# Patient Record
Sex: Male | Born: 1935 | Race: Black or African American | Hispanic: No | Marital: Married | Smoking: Former smoker
Health system: Southern US, Community
[De-identification: ages and names within clinical notes are randomized; demographics above are authoritative.]

## PROBLEM LIST (undated history)

## (undated) DIAGNOSIS — D500 Iron deficiency anemia secondary to blood loss (chronic): Secondary | ICD-10-CM

## (undated) DIAGNOSIS — M1990 Unspecified osteoarthritis, unspecified site: Secondary | ICD-10-CM

## (undated) DIAGNOSIS — I10 Essential (primary) hypertension: Secondary | ICD-10-CM

## (undated) DIAGNOSIS — E7800 Pure hypercholesterolemia, unspecified: Secondary | ICD-10-CM

## (undated) DIAGNOSIS — D649 Anemia, unspecified: Secondary | ICD-10-CM

## (undated) DIAGNOSIS — E539 Vitamin B deficiency, unspecified: Secondary | ICD-10-CM

## (undated) DIAGNOSIS — K219 Gastro-esophageal reflux disease without esophagitis: Secondary | ICD-10-CM

## (undated) DIAGNOSIS — I639 Cerebral infarction, unspecified: Secondary | ICD-10-CM

## (undated) HISTORY — PX: OTHER SURGICAL HISTORY: SHX169

## (undated) HISTORY — DX: Iron deficiency anemia secondary to blood loss (chronic): D50.0

## (undated) HISTORY — DX: Cerebral infarction, unspecified: I63.9

## (undated) HISTORY — DX: Unspecified osteoarthritis, unspecified site: M19.90

## (undated) HISTORY — PX: NO PAST SURGERIES: SHX2092

---

## 2010-10-11 ENCOUNTER — Emergency Department (HOSPITAL_COMMUNITY)
Admission: EM | Admit: 2010-10-11 | Discharge: 2010-10-11 | Payer: Self-pay | Source: Home / Self Care | Admitting: Emergency Medicine

## 2016-07-30 DIAGNOSIS — I10 Essential (primary) hypertension: Secondary | ICD-10-CM | POA: Diagnosis not present

## 2016-07-30 DIAGNOSIS — Z1211 Encounter for screening for malignant neoplasm of colon: Secondary | ICD-10-CM | POA: Diagnosis not present

## 2016-07-30 DIAGNOSIS — Z6827 Body mass index (BMI) 27.0-27.9, adult: Secondary | ICD-10-CM | POA: Diagnosis not present

## 2016-07-30 DIAGNOSIS — Z23 Encounter for immunization: Secondary | ICD-10-CM | POA: Diagnosis not present

## 2016-07-30 DIAGNOSIS — Z0000 Encounter for general adult medical examination without abnormal findings: Secondary | ICD-10-CM | POA: Diagnosis not present

## 2016-07-30 DIAGNOSIS — E7801 Familial hypercholesterolemia: Secondary | ICD-10-CM | POA: Diagnosis not present

## 2016-07-30 DIAGNOSIS — Z1389 Encounter for screening for other disorder: Secondary | ICD-10-CM | POA: Diagnosis not present

## 2016-08-13 DIAGNOSIS — D508 Other iron deficiency anemias: Secondary | ICD-10-CM | POA: Diagnosis not present

## 2016-10-31 DIAGNOSIS — F17200 Nicotine dependence, unspecified, uncomplicated: Secondary | ICD-10-CM | POA: Diagnosis not present

## 2016-10-31 DIAGNOSIS — Z79899 Other long term (current) drug therapy: Secondary | ICD-10-CM | POA: Diagnosis not present

## 2016-10-31 DIAGNOSIS — Z7982 Long term (current) use of aspirin: Secondary | ICD-10-CM | POA: Diagnosis not present

## 2016-10-31 DIAGNOSIS — R0989 Other specified symptoms and signs involving the circulatory and respiratory systems: Secondary | ICD-10-CM | POA: Diagnosis not present

## 2016-10-31 DIAGNOSIS — R05 Cough: Secondary | ICD-10-CM | POA: Diagnosis not present

## 2016-10-31 DIAGNOSIS — J40 Bronchitis, not specified as acute or chronic: Secondary | ICD-10-CM | POA: Diagnosis not present

## 2016-10-31 DIAGNOSIS — Z720 Tobacco use: Secondary | ICD-10-CM | POA: Diagnosis not present

## 2017-02-19 DIAGNOSIS — M19041 Primary osteoarthritis, right hand: Secondary | ICD-10-CM | POA: Diagnosis not present

## 2017-02-19 DIAGNOSIS — L900 Lichen sclerosus et atrophicus: Secondary | ICD-10-CM | POA: Diagnosis not present

## 2017-02-19 DIAGNOSIS — G5603 Carpal tunnel syndrome, bilateral upper limbs: Secondary | ICD-10-CM | POA: Diagnosis not present

## 2017-02-19 DIAGNOSIS — F17200 Nicotine dependence, unspecified, uncomplicated: Secondary | ICD-10-CM | POA: Diagnosis not present

## 2017-02-19 DIAGNOSIS — G5601 Carpal tunnel syndrome, right upper limb: Secondary | ICD-10-CM | POA: Diagnosis not present

## 2017-02-19 DIAGNOSIS — M19042 Primary osteoarthritis, left hand: Secondary | ICD-10-CM | POA: Diagnosis not present

## 2017-04-29 ENCOUNTER — Ambulatory Visit: Payer: Self-pay | Admitting: Family Medicine

## 2017-05-01 ENCOUNTER — Ambulatory Visit: Payer: Self-pay | Admitting: Family Medicine

## 2017-05-21 DIAGNOSIS — R21 Rash and other nonspecific skin eruption: Secondary | ICD-10-CM | POA: Diagnosis not present

## 2017-12-11 DIAGNOSIS — K7200 Acute and subacute hepatic failure without coma: Secondary | ICD-10-CM | POA: Diagnosis not present

## 2017-12-11 DIAGNOSIS — I700 Atherosclerosis of aorta: Secondary | ICD-10-CM | POA: Diagnosis not present

## 2017-12-11 DIAGNOSIS — J439 Emphysema, unspecified: Secondary | ICD-10-CM | POA: Diagnosis not present

## 2017-12-11 DIAGNOSIS — D509 Iron deficiency anemia, unspecified: Secondary | ICD-10-CM | POA: Diagnosis not present

## 2017-12-11 DIAGNOSIS — D62 Acute posthemorrhagic anemia: Secondary | ICD-10-CM | POA: Diagnosis not present

## 2017-12-11 DIAGNOSIS — J441 Chronic obstructive pulmonary disease with (acute) exacerbation: Secondary | ICD-10-CM | POA: Diagnosis not present

## 2017-12-11 DIAGNOSIS — E874 Mixed disorder of acid-base balance: Secondary | ICD-10-CM | POA: Diagnosis not present

## 2017-12-11 DIAGNOSIS — K551 Chronic vascular disorders of intestine: Secondary | ICD-10-CM | POA: Diagnosis not present

## 2017-12-11 DIAGNOSIS — R4182 Altered mental status, unspecified: Secondary | ICD-10-CM | POA: Diagnosis not present

## 2017-12-11 DIAGNOSIS — D684 Acquired coagulation factor deficiency: Secondary | ICD-10-CM | POA: Diagnosis not present

## 2017-12-11 DIAGNOSIS — R7989 Other specified abnormal findings of blood chemistry: Secondary | ICD-10-CM | POA: Diagnosis not present

## 2017-12-11 DIAGNOSIS — D72829 Elevated white blood cell count, unspecified: Secondary | ICD-10-CM | POA: Diagnosis not present

## 2017-12-11 DIAGNOSIS — F17200 Nicotine dependence, unspecified, uncomplicated: Secondary | ICD-10-CM | POA: Diagnosis not present

## 2017-12-11 DIAGNOSIS — N179 Acute kidney failure, unspecified: Secondary | ICD-10-CM | POA: Diagnosis not present

## 2017-12-11 DIAGNOSIS — D649 Anemia, unspecified: Secondary | ICD-10-CM | POA: Diagnosis not present

## 2017-12-11 DIAGNOSIS — R0602 Shortness of breath: Secondary | ICD-10-CM | POA: Diagnosis not present

## 2017-12-11 DIAGNOSIS — R933 Abnormal findings on diagnostic imaging of other parts of digestive tract: Secondary | ICD-10-CM | POA: Diagnosis not present

## 2017-12-11 DIAGNOSIS — J9602 Acute respiratory failure with hypercapnia: Secondary | ICD-10-CM | POA: Diagnosis not present

## 2017-12-11 DIAGNOSIS — K529 Noninfective gastroenteritis and colitis, unspecified: Secondary | ICD-10-CM | POA: Diagnosis not present

## 2017-12-11 DIAGNOSIS — I21A1 Myocardial infarction type 2: Secondary | ICD-10-CM | POA: Diagnosis not present

## 2017-12-11 DIAGNOSIS — R410 Disorientation, unspecified: Secondary | ICD-10-CM | POA: Diagnosis not present

## 2017-12-11 DIAGNOSIS — J9601 Acute respiratory failure with hypoxia: Secondary | ICD-10-CM | POA: Diagnosis not present

## 2017-12-11 DIAGNOSIS — I6523 Occlusion and stenosis of bilateral carotid arteries: Secondary | ICD-10-CM | POA: Diagnosis not present

## 2017-12-11 DIAGNOSIS — R140 Abdominal distension (gaseous): Secondary | ICD-10-CM | POA: Diagnosis not present

## 2017-12-11 DIAGNOSIS — K828 Other specified diseases of gallbladder: Secondary | ICD-10-CM | POA: Diagnosis not present

## 2017-12-11 DIAGNOSIS — R93422 Abnormal radiologic findings on diagnostic imaging of left kidney: Secondary | ICD-10-CM | POA: Diagnosis not present

## 2017-12-11 DIAGNOSIS — I6389 Other cerebral infarction: Secondary | ICD-10-CM | POA: Diagnosis not present

## 2017-12-11 DIAGNOSIS — E875 Hyperkalemia: Secondary | ICD-10-CM | POA: Diagnosis not present

## 2017-12-11 DIAGNOSIS — K922 Gastrointestinal hemorrhage, unspecified: Secondary | ICD-10-CM | POA: Diagnosis not present

## 2017-12-11 DIAGNOSIS — R6521 Severe sepsis with septic shock: Secondary | ICD-10-CM | POA: Diagnosis not present

## 2017-12-11 DIAGNOSIS — G464 Cerebellar stroke syndrome: Secondary | ICD-10-CM | POA: Diagnosis not present

## 2017-12-11 DIAGNOSIS — I10 Essential (primary) hypertension: Secondary | ICD-10-CM | POA: Diagnosis not present

## 2017-12-11 DIAGNOSIS — E872 Acidosis: Secondary | ICD-10-CM | POA: Diagnosis not present

## 2017-12-11 DIAGNOSIS — I6340 Cerebral infarction due to embolism of unspecified cerebral artery: Secondary | ICD-10-CM | POA: Diagnosis not present

## 2017-12-11 DIAGNOSIS — R0603 Acute respiratory distress: Secondary | ICD-10-CM | POA: Diagnosis not present

## 2017-12-11 DIAGNOSIS — N3090 Cystitis, unspecified without hematuria: Secondary | ICD-10-CM | POA: Diagnosis not present

## 2017-12-11 DIAGNOSIS — D72828 Other elevated white blood cell count: Secondary | ICD-10-CM | POA: Diagnosis not present

## 2017-12-11 DIAGNOSIS — Z4682 Encounter for fitting and adjustment of non-vascular catheter: Secondary | ICD-10-CM | POA: Diagnosis not present

## 2017-12-11 DIAGNOSIS — R932 Abnormal findings on diagnostic imaging of liver and biliary tract: Secondary | ICD-10-CM | POA: Diagnosis not present

## 2017-12-11 DIAGNOSIS — R0682 Tachypnea, not elsewhere classified: Secondary | ICD-10-CM | POA: Diagnosis not present

## 2017-12-11 DIAGNOSIS — I5030 Unspecified diastolic (congestive) heart failure: Secondary | ICD-10-CM | POA: Diagnosis not present

## 2017-12-11 DIAGNOSIS — D500 Iron deficiency anemia secondary to blood loss (chronic): Secondary | ICD-10-CM | POA: Diagnosis not present

## 2017-12-11 DIAGNOSIS — A419 Sepsis, unspecified organism: Secondary | ICD-10-CM | POA: Diagnosis not present

## 2017-12-11 DIAGNOSIS — M6282 Rhabdomyolysis: Secondary | ICD-10-CM | POA: Diagnosis not present

## 2017-12-11 DIAGNOSIS — R000 Tachycardia, unspecified: Secondary | ICD-10-CM | POA: Diagnosis not present

## 2017-12-11 DIAGNOSIS — B9620 Unspecified Escherichia coli [E. coli] as the cause of diseases classified elsewhere: Secondary | ICD-10-CM | POA: Diagnosis not present

## 2017-12-11 DIAGNOSIS — R1084 Generalized abdominal pain: Secondary | ICD-10-CM | POA: Diagnosis not present

## 2017-12-11 DIAGNOSIS — R9431 Abnormal electrocardiogram [ECG] [EKG]: Secondary | ICD-10-CM | POA: Diagnosis not present

## 2017-12-11 DIAGNOSIS — R945 Abnormal results of liver function studies: Secondary | ICD-10-CM | POA: Diagnosis not present

## 2017-12-11 DIAGNOSIS — I214 Non-ST elevation (NSTEMI) myocardial infarction: Secondary | ICD-10-CM | POA: Diagnosis not present

## 2017-12-11 DIAGNOSIS — I080 Rheumatic disorders of both mitral and aortic valves: Secondary | ICD-10-CM | POA: Diagnosis not present

## 2017-12-11 DIAGNOSIS — I7090 Unspecified atherosclerosis: Secondary | ICD-10-CM | POA: Diagnosis not present

## 2017-12-11 DIAGNOSIS — K559 Vascular disorder of intestine, unspecified: Secondary | ICD-10-CM | POA: Diagnosis not present

## 2017-12-11 DIAGNOSIS — G9340 Encephalopathy, unspecified: Secondary | ICD-10-CM | POA: Diagnosis not present

## 2017-12-11 DIAGNOSIS — I669 Occlusion and stenosis of unspecified cerebral artery: Secondary | ICD-10-CM | POA: Diagnosis not present

## 2017-12-11 DIAGNOSIS — R609 Edema, unspecified: Secondary | ICD-10-CM | POA: Diagnosis not present

## 2017-12-11 DIAGNOSIS — Z720 Tobacco use: Secondary | ICD-10-CM | POA: Diagnosis not present

## 2017-12-11 DIAGNOSIS — I219 Acute myocardial infarction, unspecified: Secondary | ICD-10-CM | POA: Diagnosis not present

## 2017-12-11 DIAGNOSIS — A09 Infectious gastroenteritis and colitis, unspecified: Secondary | ICD-10-CM | POA: Diagnosis not present

## 2017-12-11 DIAGNOSIS — N4889 Other specified disorders of penis: Secondary | ICD-10-CM | POA: Diagnosis not present

## 2017-12-11 DIAGNOSIS — N390 Urinary tract infection, site not specified: Secondary | ICD-10-CM | POA: Diagnosis not present

## 2017-12-11 DIAGNOSIS — I350 Nonrheumatic aortic (valve) stenosis: Secondary | ICD-10-CM | POA: Diagnosis not present

## 2017-12-11 DIAGNOSIS — R579 Shock, unspecified: Secondary | ICD-10-CM | POA: Diagnosis not present

## 2017-12-11 DIAGNOSIS — D696 Thrombocytopenia, unspecified: Secondary | ICD-10-CM | POA: Diagnosis not present

## 2017-12-11 DIAGNOSIS — R9389 Abnormal findings on diagnostic imaging of other specified body structures: Secondary | ICD-10-CM | POA: Diagnosis not present

## 2017-12-11 DIAGNOSIS — E278 Other specified disorders of adrenal gland: Secondary | ICD-10-CM | POA: Diagnosis not present

## 2017-12-11 DIAGNOSIS — G8190 Hemiplegia, unspecified affecting unspecified side: Secondary | ICD-10-CM | POA: Diagnosis not present

## 2017-12-11 DIAGNOSIS — I639 Cerebral infarction, unspecified: Secondary | ICD-10-CM | POA: Diagnosis not present

## 2017-12-11 DIAGNOSIS — Z7401 Bed confinement status: Secondary | ICD-10-CM | POA: Diagnosis not present

## 2017-12-11 DIAGNOSIS — E876 Hypokalemia: Secondary | ICD-10-CM | POA: Diagnosis not present

## 2017-12-11 DIAGNOSIS — I491 Atrial premature depolarization: Secondary | ICD-10-CM | POA: Diagnosis not present

## 2017-12-11 DIAGNOSIS — R93421 Abnormal radiologic findings on diagnostic imaging of right kidney: Secondary | ICD-10-CM | POA: Diagnosis not present

## 2017-12-25 NOTE — Progress Notes (Signed)
This encounter was created in error - please disregard.

## 2017-12-28 DIAGNOSIS — J439 Emphysema, unspecified: Secondary | ICD-10-CM | POA: Diagnosis not present

## 2017-12-28 DIAGNOSIS — I10 Essential (primary) hypertension: Secondary | ICD-10-CM | POA: Diagnosis not present

## 2017-12-28 DIAGNOSIS — Z4800 Encounter for change or removal of nonsurgical wound dressing: Secondary | ICD-10-CM | POA: Diagnosis not present

## 2017-12-28 DIAGNOSIS — Z7982 Long term (current) use of aspirin: Secondary | ICD-10-CM | POA: Diagnosis not present

## 2017-12-28 DIAGNOSIS — E782 Mixed hyperlipidemia: Secondary | ICD-10-CM | POA: Diagnosis not present

## 2017-12-28 DIAGNOSIS — F1010 Alcohol abuse, uncomplicated: Secondary | ICD-10-CM | POA: Diagnosis not present

## 2017-12-28 DIAGNOSIS — S3120XD Unspecified open wound of penis, subsequent encounter: Secondary | ICD-10-CM | POA: Diagnosis not present

## 2017-12-28 DIAGNOSIS — Z87891 Personal history of nicotine dependence: Secondary | ICD-10-CM | POA: Diagnosis not present

## 2017-12-28 DIAGNOSIS — D649 Anemia, unspecified: Secondary | ICD-10-CM | POA: Diagnosis not present

## 2017-12-30 ENCOUNTER — Other Ambulatory Visit: Payer: Self-pay | Admitting: *Deleted

## 2017-12-30 NOTE — Patient Outreach (Signed)
[facility]) Care Management  12/30/2017  Jared Schmidt [birth year] [id number]  Referral from [facility] UM department-Tammy Blackwell-[phone]--HTA insurance; Reason: Recent admission to [facility] in [place] with severe hyperkalemia, acidosis, hematuria vs blood in stool with anemia. Member required intubation and CRRT (con't renal replacement therapy). Member with suspected sepsis -unknown cause, acute encephalopathy. Elevated troponins, B CVA. Neurosurgeon.   Admission 2/7-2/22/2019 Telephone call to patient; spouse answered call & advised that she takes all of patient's healthcare calls.  Advised I would need permission from patient to talk to spouse. Patient gave HIPPA verification & advised that I had permission to speak with caregiver/spouse regarding his health concerns.  Spouse voices that patient was taken to [facility] with symptoms of confusion, delusions, & severe diarrhea. States he was airlifted to [facility] in [place], [place] where he was placed in intensive care unit(Admission 2/7-2/22/2019).  Voices he had sepsis but did not know cause. States he does have liver damage.  Admission voices he is home now with [facility] in place.   Voices patient is walking slowly and is weak. States he is not currently using cane or assistance. States she watches him closely and does not leave him alone in bathroom while he is taking bath.   States follow up appointment scheduled with Dr. Luan Pulling for 01/15/2018. Voices she will drive him to appointments. States medical records have to be sent to primary care office and they has not received them when she called to make appointment.  States she manages patient's medications & makes sure his takes as directed.    Advised of [facility] care management services. Consents to services of Museum/gallery curator. "Transition of care will be completed by primary care office who will refer to  [facility] if needed"..  Patient appropriate for [facility] services of community care coordinator. Patient with complex diagnosis-"sepsis" according to spouse. Required intubation & continuous renal replacement therapy while hospitalized. Patient is falls risk-weak & deconditioned following 15 day hospital stay.   Plan:Refer care management assistant to assign [facility] care coordinator.  Sherrin Daisy, RN BSN [place] Management Coordinator [facility] Care Management  [phone]

## 2018-01-02 ENCOUNTER — Other Ambulatory Visit: Payer: Self-pay | Admitting: *Deleted

## 2018-01-02 NOTE — Patient Outreach (Signed)
RN CM called pt to schedule initial home visit, spoke with spouse, HIPAA verified, initial home visit scheduled for week of 01/12/18.  PLAN See pt for initial home visit week of 01/12/18  Jacqlyn Larsen [facility], [place] Coordinator [phone]

## 2018-01-12 ENCOUNTER — Encounter: Payer: Self-pay | Admitting: *Deleted

## 2018-01-12 ENCOUNTER — Other Ambulatory Visit: Payer: Self-pay | Admitting: *Deleted

## 2018-01-12 NOTE — Patient Outreach (Addendum)
[facility]) Care Management   01/12/2018  Jared Schmidt Jul 21, 1936 [id number]  Jared Schmidt is an 82 y.o. male  Subjective: Initial home visit with pt, HIPAA verified, wife Jared Schmidt present, pt states he [place] of [place] "because that's where the people in [place] sent me to [place]"  Pt reports he had sepsis "but they don't really know why"  Pt reports he had " high potassium level in the hospital"  Pt states he recently quit smoking with recent hospitalization.  Objective:   Vitals:   01/12/18 1311  BP: 130/60  Pulse: 80  Resp: 18  Weight: 138 lb (62.6 kg)  Height: 1.702 m (5\' 7" )   ROS  Physical Exam  Constitutional: He is oriented to person, place, and time. He appears well-developed.  HENT:  Head: Normocephalic.  Neck: Normal range of motion. Neck supple.  Cardiovascular: Normal rate.  Respiratory: Effort normal.  Diminished bil breath sounds upper and lower  GI: Soft. Bowel sounds are normal.  Musculoskeletal: Normal range of motion. He exhibits no edema.  Neurological: He is alert and oriented to person, place, and time.  Skin: Skin is warm and dry.  Psychiatric: He has a normal mood and affect. His behavior is normal. Judgment and thought content normal.    Encounter Medications:   Outpatient Encounter Medications as of 01/12/2018  Medication Sig  . amLODipine (NORVASC) 5 MG tablet Take 5 mg by mouth daily.  Marland Kitchen aspirin EC 81 MG tablet Take 81 mg by mouth daily.  Marland Kitchen atorvastatin (LIPITOR) 10 MG tablet Take 10 mg by mouth daily at 6 PM.  . carvedilol (COREG) 12.5 MG tablet Take 12.5 mg by mouth 2 (two) times daily with a meal.  . ferrous sulfate 325 (65 FE) MG tablet Take 325 mg by mouth.  . pantoprazole (PROTONIX) 40 MG tablet Take 40 mg by mouth 2 (two) times daily.  Marland Kitchen thiamine 100 MG tablet Take 100 mg by mouth daily.   No facility-administered encounter medications on file as of 01/12/2018.     Functional Status:   In your present  state of health, do you have any difficulty performing the following activities: 01/12/2018 12/30/2017  Hearing? N N  Vision? N N  Difficulty concentrating or making decisions? N N  Walking or climbing stairs? Y Y  Dressing or bathing? N Y  Doing errands, shopping? Tempie Donning  Preparing Food and eating ? N -  Using the Toilet? N -  In the past six months, have you accidently leaked urine? N -  Do you have problems with loss of bowel control? N -  Managing your Medications? Y -  Managing your Finances? N -  Housekeeping or managing your Housekeeping? Y -  Some recent data might be hidden    Fall/Depression Screening:    Fall Risk  01/12/2018 12/30/2017  Falls in the past year? No No  Risk for fall due to : Medication side effect Impaired mobility   PHQ 2/9 Scores 01/12/2018 12/30/2017  PHQ - 2 Score 0 -  Exception Documentation - (No Data)    Assessment:  **TOC being completed by primary MD office, pt also confirms this**RN CM observed medication bottles and reviewed with wife and pt, wife oversees medications, pantoprazole bottle is empty, wife is going to discuss with primary MD this week to see if pt is supposed to continue taking.  Pt lives with wife and has no children, wife assists with transportation, cooking and cleaning.  RN CM focused on infection/ sepsis prevention.  RN CM gave pt EMMI handouts related to hyperkalemia and discussed with pt and wife.  Gait is steady today, pt is not using cane, pt denies having any falls.  RN CM faxed initial home visit, barrier letter to primary MD Dr. Velvet Bathe.  THN CM Care Plan Problem One     Most Recent Value  Care Plan Problem One  Knowledge deficit related identifying symptoms infection/ sepsis  Role Documenting the Problem One  Care Management Coordinator  Care Plan for Problem One  Active  THN Long Term Goal   Pt will demonstrate/ verbalize improved self care, symptom management related to sepsis within 60 days  THN Long Term Goal Start Date   01/12/18  Interventions for Problem One Long Term Goal  RN CM reviewed signs/ symptoms infection/ sepsis, gave 24 hour nurse line magnet, reviewed resources to call for change in symptoms, health status  THN CM Short Term Goal #1   pt will verbalize signs/ symptoms infection/ sepsis within 30 days  THN CM Short Term Goal #1 Start Date  01/12/18  Interventions for Short Term Goal #1  RN CM reviewed from hospital discharge summary- signs/ symptoms sepsis and importance of calling MD early for change in health status  THN CM Short Term Goal #2   Pt will verbalize ways to prevent infection within 30 days  THN CM Short Term Goal #2 Start Date  01/12/18  Interventions for Short Term Goal #2  RN CM reviewed importance of avoiding sick persons, being diligent with handwashing eating a healthy diet, drinking adequate fluids, working with PT for strengthening to stay as healthy as possible, walking daily, using cane if needed.      Plan: see pt for home visit next month  Jacqlyn Larsen [facility], [place] Coordinator [phone]

## 2018-01-15 DIAGNOSIS — A419 Sepsis, unspecified organism: Secondary | ICD-10-CM | POA: Diagnosis not present

## 2018-01-15 DIAGNOSIS — E785 Hyperlipidemia, unspecified: Secondary | ICD-10-CM | POA: Diagnosis not present

## 2018-01-15 DIAGNOSIS — I25700 Atherosclerosis of coronary artery bypass graft(s), unspecified, with unstable angina pectoris: Secondary | ICD-10-CM | POA: Diagnosis not present

## 2018-01-19 DIAGNOSIS — Z7982 Long term (current) use of aspirin: Secondary | ICD-10-CM | POA: Diagnosis not present

## 2018-01-20 ENCOUNTER — Observation Stay (HOSPITAL_COMMUNITY)
Admission: EM | Admit: 2018-01-20 | Discharge: 2018-01-21 | Disposition: A | Discharge status: Home / Self Care | Payer: PPO | Attending: Pulmonary Disease | Admitting: Pulmonary Disease

## 2018-01-20 ENCOUNTER — Encounter (HOSPITAL_COMMUNITY): Payer: Self-pay | Admitting: Emergency Medicine

## 2018-01-20 ENCOUNTER — Other Ambulatory Visit: Payer: Self-pay

## 2018-01-20 DIAGNOSIS — I10 Essential (primary) hypertension: Secondary | ICD-10-CM | POA: Diagnosis not present

## 2018-01-20 DIAGNOSIS — K219 Gastro-esophageal reflux disease without esophagitis: Secondary | ICD-10-CM | POA: Diagnosis not present

## 2018-01-20 DIAGNOSIS — Z87891 Personal history of nicotine dependence: Secondary | ICD-10-CM | POA: Diagnosis not present

## 2018-01-20 DIAGNOSIS — Z79899 Other long term (current) drug therapy: Secondary | ICD-10-CM | POA: Diagnosis not present

## 2018-01-20 DIAGNOSIS — E7800 Pure hypercholesterolemia, unspecified: Secondary | ICD-10-CM | POA: Diagnosis present

## 2018-01-20 DIAGNOSIS — X58XXXA Exposure to other specified factors, initial encounter: Secondary | ICD-10-CM | POA: Diagnosis not present

## 2018-01-20 DIAGNOSIS — Y939 Activity, unspecified: Secondary | ICD-10-CM | POA: Insufficient documentation

## 2018-01-20 DIAGNOSIS — E539 Vitamin B deficiency, unspecified: Secondary | ICD-10-CM | POA: Diagnosis present

## 2018-01-20 DIAGNOSIS — Y999 Unspecified external cause status: Secondary | ICD-10-CM | POA: Insufficient documentation

## 2018-01-20 DIAGNOSIS — Z7982 Long term (current) use of aspirin: Secondary | ICD-10-CM | POA: Diagnosis not present

## 2018-01-20 DIAGNOSIS — Y929 Unspecified place or not applicable: Secondary | ICD-10-CM | POA: Diagnosis not present

## 2018-01-20 DIAGNOSIS — S3120XA Unspecified open wound of penis, initial encounter: Secondary | ICD-10-CM | POA: Diagnosis present

## 2018-01-20 DIAGNOSIS — D649 Anemia, unspecified: Secondary | ICD-10-CM

## 2018-01-20 DIAGNOSIS — R000 Tachycardia, unspecified: Secondary | ICD-10-CM | POA: Diagnosis not present

## 2018-01-20 HISTORY — DX: Vitamin B deficiency, unspecified: E53.9

## 2018-01-20 HISTORY — DX: Essential (primary) hypertension: I10

## 2018-01-20 HISTORY — DX: Anemia, unspecified: D64.9

## 2018-01-20 HISTORY — DX: Pure hypercholesterolemia, unspecified: E78.00

## 2018-01-20 HISTORY — DX: Gastro-esophageal reflux disease without esophagitis: K21.9

## 2018-01-20 LAB — CBC WITH DIFFERENTIAL/PLATELET
BASOS ABS: 0 10*3/uL (ref 0.0–0.1)
BASOS PCT: 0 %
EOS PCT: 1 %
Eosinophils Absolute: 0.1 10*3/uL (ref 0.0–0.7)
HCT: 25.2 % — ABNORMAL LOW (ref 39.0–52.0)
Hemoglobin: 6.7 g/dL — CL (ref 13.0–17.0)
LYMPHS PCT: 12 %
Lymphs Abs: 1 10*3/uL (ref 0.7–4.0)
MCH: 19.4 pg — ABNORMAL LOW (ref 26.0–34.0)
MCHC: 26.6 g/dL — ABNORMAL LOW (ref 30.0–36.0)
MCV: 72.8 fL — AB (ref 78.0–100.0)
Monocytes Absolute: 0.9 10*3/uL (ref 0.1–1.0)
Monocytes Relative: 11 %
NEUTROS ABS: 6.8 10*3/uL (ref 1.7–7.7)
Neutrophils Relative %: 76 %
Platelets: 307 10*3/uL (ref 150–400)
RBC: 3.46 MIL/uL — AB (ref 4.22–5.81)
RDW: 25.8 % — ABNORMAL HIGH (ref 11.5–15.5)
WBC: 8.8 10*3/uL (ref 4.0–10.5)

## 2018-01-20 LAB — COMPREHENSIVE METABOLIC PANEL
ALBUMIN: 2.8 g/dL — AB (ref 3.5–5.0)
ALT: 18 U/L (ref 17–63)
ANION GAP: 11 (ref 5–15)
AST: 27 U/L (ref 15–41)
Alkaline Phosphatase: 92 U/L (ref 38–126)
BILIRUBIN TOTAL: 0.6 mg/dL (ref 0.3–1.2)
BUN: 17 mg/dL (ref 6–20)
CO2: 18 mmol/L — ABNORMAL LOW (ref 22–32)
Calcium: 8.4 mg/dL — ABNORMAL LOW (ref 8.9–10.3)
Chloride: 108 mmol/L (ref 101–111)
Creatinine, Ser: 1.18 mg/dL (ref 0.61–1.24)
GFR, EST NON AFRICAN AMERICAN: 57 mL/min — AB (ref >60)
GLUCOSE: 145 mg/dL — AB (ref 65–99)
POTASSIUM: 3.8 mmol/L (ref 3.5–5.1)
Sodium: 137 mmol/L (ref 135–145)
Total Protein: 6.3 g/dL — ABNORMAL LOW (ref 6.5–8.1)

## 2018-01-20 LAB — ABO/RH: ABO/RH(D): A POS

## 2018-01-20 LAB — PROTIME-INR
INR: 1.06
PROTHROMBIN TIME: 13.7 s (ref 11.4–15.2)

## 2018-01-20 MED ORDER — ACETAMINOPHEN 650 MG RE SUPP: 650.0000 mg | Freq: Four times a day (QID) | RECTAL | Status: DC | PRN

## 2018-01-20 MED ORDER — ONDANSETRON HCL 4 MG/2ML IJ SOLN: 4.0000 mg | Freq: Four times a day (QID) | INTRAMUSCULAR | Status: DC | PRN

## 2018-01-20 MED ORDER — ONDANSETRON HCL 4 MG PO TABS: 4.0000 mg | ORAL_TABLET | Freq: Four times a day (QID) | ORAL | Status: DC | PRN

## 2018-01-20 MED ORDER — SODIUM CHLORIDE 0.9 % IV SOLN
10.0000 mL/h | Freq: Once | INTRAVENOUS | Status: AC
  Administered 2018-01-21: 10 mL/h via INTRAVENOUS

## 2018-01-20 MED ORDER — ACETAMINOPHEN 325 MG PO TABS: 650.0000 mg | ORAL_TABLET | Freq: Four times a day (QID) | ORAL | Status: DC | PRN

## 2018-01-20 NOTE — Progress Notes (Signed)
Dr.  Olevia Bowens paged and made aware that no diet is ordered as well as no home medications ordered. Pt and wife are wondering about this. Waiting for orders/call back.

## 2018-01-20 NOTE — H&P (Signed)
History and Physical    Jared Schmidt HUD:[id number] DOB: [birth year] DOA: 01/20/2018  PCP: Sinda Du, MD   Patient coming from: Home.  I have personally briefly reviewed patient's old medical records in [place]  Chief Complaint: Abnormal lab result.  HPI: Jared Schmidt is a 82 y.o. male with medical history significant of anemia, GERD, hyperlipidemia, hypertension, vitamin B12 deficiency who is coming to the emergency department after Dr. Luan Pulling, who is his PCP called him and told him about his low hemoglobin level.  He was asked to come to the ED for PRBC transfusion.  He states that he has been feeling a little tired, but denies dyspnea, chest pain, palpitations, dizziness, diaphoresis or pitting edema of the lower extremities.  He denies fever, chills, headache, sore throat, productive cough or hemoptysis.  He denies abdominal pain, nausea, emesis, diarrhea, constipation, melena or hematochezia.  States he has had some discomfort urinating, since he had trauma on his penis, apparently from Foley catheter, while he was in the hospital in [place], [place].   The patient was seen at [facility] ED last month.  He was transferred to [facility], [place] and was in the hospital for .  ED Course: Initial vital signs temperature 37.1C, pulse 77, respirations 18, blood pressure 118/60 mmHg and O2 sat 100% on room air.  His white count was 8.8 with 76% neutrophils, 12% lymphocytes and 11% monocytes.  His hemoglobin 6.7 g/dL and platelets 307.  PT and INR were normal.  Sodium is 137, potassium 3.8, chloride 108 and CO2 18 millimol/L.  BUN was 17, creatinine 1.18 and glucose 145 mg/dL.  Total protein was 6.3 and albumin 2.8 g/dL, the rest of the LFTs are within normal limits.  Review of Systems: As per HPI otherwise 10 point review of systems negative.    Past Medical History:  Diagnosis Date  . Anemia   . GERD (gastroesophageal reflux disease)   . Hypercholesteremia   . Hypertension   .  Vitamin B deficiency     History reviewed. No pertinent surgical history.   reports that he quit smoking about 5 weeks ago. His smoking use included cigarettes. He has a 33.50 pack-year smoking history. he has never used smokeless tobacco. He reports that he does not drink alcohol or use drugs.  No Active Allergies  History reviewed.  Family history unknown to the patient. He does not know of any medical history on his parents. He has 2 older brothers, but he does not know if they have any current or past medical issues.  Prior to Admission medications   Medication Sig Start Date End Date Taking? Authorizing Provider  amLODipine (NORVASC) 5 MG tablet Take 5 mg by mouth daily.    [provider]  aspirin EC 81 MG tablet Take 81 mg by mouth daily.     [provider]  atorvastatin (LIPITOR) 10 MG tablet Take 10 mg by mouth daily at 6 PM.    [provider]  carvedilol (COREG) 12.5 MG tablet Take 12.5 mg by mouth 2 (two) times daily with a meal.    [provider]  ferrous sulfate 325 (65 FE) MG tablet Take 325 mg by mouth.    [provider]  pantoprazole (PROTONIX) 40 MG tablet Take 40 mg by mouth 2 (two) times daily.    [provider]  thiamine 100 MG tablet Take 100 mg by mouth daily.    [provider]    Physical Exam: Vitals:  01/20/18 1819  BP: 118/60  Pulse: 77  Temp: 98.8 F (37.1 C)  TempSrc: Oral  SpO2: 100%    Constitutional: NAD, calm, comfortable Eyes: PERRL, lids and conjunctivae normal ENMT: Mucous membranes are moist. Posterior pharynx clear of any exudate or lesions. Neck: normal, supple, no masses, no thyromegaly Respiratory: clear to auscultation bilaterally, no wheezing, no crackles. Normal respiratory effort. No accessory muscle use.  Cardiovascular: Regular rate and rhythm, 2/6 non-decrescendo diastolic murmur, no rubs / gallops. No extremity edema. 2+ pedal pulses. No carotid bruits.    Abdomen: no tenderness, no masses palpated. No hepatosplenomegaly. Bowel sounds positive.  Genitalia: Positive penile wound.  Please see picture below.  musculoskeletal: no clubbing / cyanosis.  Good ROM, no contractures. Normal muscle tone.  Skin: no rashes, lesions, ulcers on limited dermatological examination. Neurologic: CN 2-12 grossly intact. Sensation intact, DTR normal. Strength 5/5 in all 4.  Psychiatric: Normal judgment and insight. Alert and oriented x 3. Normal mood.      Labs on Admission: I have personally reviewed following labs and imaging studies  CBC: Recent Labs  Lab 01/20/18 1928  WBC 8.8  NEUTROABS 6.8  HGB 6.7*  HCT 25.2*  MCV 72.8*  PLT 353   Basic Metabolic Panel: Recent Labs  Lab 01/20/18 2007  NA 137  K 3.8  CL 108  CO2 18*  GLUCOSE 145*  BUN 17  CREATININE 1.18  CALCIUM 8.4*   GFR: Estimated Creatinine Clearance: 44.9 mL/min (by C-G formula based on SCr of 1.18 mg/dL). Liver Function Tests: Recent Labs  Lab 01/20/18 2007  AST 27  ALT 18  ALKPHOS 92  BILITOT 0.6  PROT 6.3*  ALBUMIN 2.8*   No results for input(s): LIPASE, AMYLASE in the last 168 hours. No results for input(s): AMMONIA in the last 168 hours. Coagulation Profile: Recent Labs  Lab 01/20/18 2007  INR 1.06   Cardiac Enzymes: No results for input(s): CKTOTAL, CKMB, CKMBINDEX, TROPONINI in the last 168 hours. BNP (last 3 results) No results for input(s): PROBNP in the last 8760 hours. HbA1C: No results for input(s): HGBA1C in the last 72 hours. CBG: No results for input(s): GLUCAP in the last 168 hours. Lipid Profile: No results for input(s): CHOL, HDL, LDLCALC, TRIG, CHOLHDL, LDLDIRECT in the last 72 hours. Thyroid Function Tests: No results for input(s): TSH, T4TOTAL, FREET4, T3FREE, THYROIDAB in the last 72 hours. Anemia Panel: No results for input(s): VITAMINB12, FOLATE, FERRITIN, TIBC, IRON, RETICCTPCT in the last 72 hours. Urine analysis: No results  found for: COLORURINE, APPEARANCEUR, LABSPEC, PHURINE, GLUCOSEU, HGBUR, BILIRUBINUR, KETONESUR, PROTEINUR, UROBILINOGEN, NITRITE, LEUKOCYTESUR  Radiological Exams on Admission: No results found.  EKG: Independently reviewed.   Assessment/Plan Principal Problem:   Symptomatic anemia Observation/MedSurg. Supplemental oxygen as needed. Continue PRBC transfusion. Monitor hematocrit and hemoglobin. He does not want any endoscopic studies performed. I told him that would like him to discuss with Dr. Luan Pulling.  Active Problems:   Open wound of penis Continue local care. The patient's wife will bring a prescription cream that he has been using tomorrow.    Hypertension Continue amlodipine 5 mg p.o. daily. Continue carvedilol 12.5 mg p.o. daily.    Hypercholesteremia Continue atorvastatin 10 mg p.o. every evening. Monitor LFTs as needed. Interval fasting lipid follow-up as an outpatient.    GERD (gastroesophageal reflux disease) Continue pantoprazole 40 mg p.o. twice daily.    Vitamin B deficiency Continue supplementation.    DVT prophylaxis: SCDs. Code Status: Full code. Family Communication: His wife was  present in the room. Disposition Plan: Observation admission for PRBC transfusion. Consults called:  Admission status: Observation/telemetry.   Reubin Milan MD [facility] Pager [phone].  If 7PM-7AM, please contact night-coverage [email or web address] Password [facility]  01/20/2018, 9:39 PM

## 2018-01-20 NOTE — ED Provider Notes (Signed)
[facility] EMERGENCY DEPARTMENT Provider Note   CSN: [id number] Arrival date & time: 01/20/18  1815     History   Chief Complaint Chief Complaint  Patient presents with  . Abnormal Lab    HPI Jared ARAVE is a 82 y.o. male.  HPI  The patient is a 82 year old male who had a previous history of anemia, acid reflux and hypercholesterolemia with hypertension, he was recently admitted to the hospital and transferred by helicopter to [facility] where he was treated inpatient for what appeared to be septic shock, multiorgan Schmidt failure and spent over 3 weeks in the hospital part of that on a ventilator in the intensive care unit.  During that admission it was noted that his hemoglobin had dropped to 4.3, he required multiple units of blood transfusions, he had watershed strokes of his brain and was found to have an elevated troponin, shock liver and renal failure requiring temporary dialysis.  Ultimately he improved significantly, his hemoglobin stabilized around 8 and he was discharged home in significantly improved condition remarkably.  Since being at home he has had nursing checking on him, they drew his blood yesterday and when his family doctor found out that his hemoglobin had dropped down to approximately 6-1/2 they requested that he come to the hospital for evaluation and transfusion this evening.  The patient denies seeing any blood in the stool, he denies that there was any positive findings when he was in the hospital on colonoscopy or endoscopy and he brings records to show his laboratory workup and imaging studies from [facility].  At this time he denies abdominal pain chest pain coughing or shortness of breath, he does have some mild fatigue but overall feels so much better from when he was in the hospital before.  He denies any focality to his numbness weakness and he has no visual changes or slurred speech.     Additional information acquired from significant other, he is  accompanied by his family member who also endorses that he has been around his baseline and has not seen any blood in the stools.  He has been eating and drinking as well.  Records reviewed in the electronic medical record as well as the records that were brought with the family member.  Past Medical History:  Diagnosis Date  . Anemia   . GERD (gastroesophageal reflux disease)   . Hypercholesteremia   . Hypertension   . Vitamin B deficiency     Patient Active Problem List   Diagnosis Date Noted  . Symptomatic anemia 01/20/2018  . Hypertension 01/20/2018  . Hypercholesteremia 01/20/2018  . GERD (gastroesophageal reflux disease) 01/20/2018  . Vitamin B deficiency 01/20/2018    History reviewed. No pertinent surgical history.     Home Medications    Prior to Admission medications   Medication Sig Start Date End Date Taking? Authorizing Provider  amLODipine (NORVASC) 5 MG tablet Take 5 mg by mouth daily.    [provider]  aspirin EC 81 MG tablet Take 81 mg by mouth daily.     [provider]  atorvastatin (LIPITOR) 10 MG tablet Take 10 mg by mouth daily at 6 PM.    [provider]  carvedilol (COREG) 12.5 MG tablet Take 12.5 mg by mouth 2 (two) times daily with a meal.    [provider]  ferrous sulfate 325 (65 FE) MG tablet Take 325 mg by mouth.    [provider]  pantoprazole (PROTONIX) 40 MG tablet  Take 40 mg by mouth 2 (two) times daily.    [provider]  thiamine 100 MG tablet Take 100 mg by mouth daily.    [provider]    Family History History reviewed. No pertinent family history.  Social History Social History   Tobacco Use  . Smoking status: Former Smoker    Packs/day: 0.50    Years: 67.00    Pack years: 33.50    Types: Cigarettes    Last attempt to quit: 12/11/2017    Years since quitting: 0.1  . Smokeless tobacco: Never Used  Substance Use Topics  . Alcohol use: No    Frequency:  Never  . Drug use: No     Allergies   Patient has no active allergies.   Review of Systems Review of Systems  All other systems reviewed and are negative.    Physical Exam Updated Vital Signs BP 118/60 (BP Location: Right Arm)   Pulse 77   Temp 98.8 F (37.1 C) (Oral)   SpO2 100%   Physical Exam  Constitutional: He appears well-developed and well-nourished. No distress.  HENT:  Head: Normocephalic and atraumatic.  Mouth/Throat: Oropharynx is clear and moist. No oropharyngeal exudate.  Pale mucous membranes  Eyes: EOM are normal. Pupils are equal, round, and reactive to light. Right eye exhibits no discharge. Left eye exhibits no discharge. No scleral icterus.  Pale conjunctive a  Neck: Normal range of motion. Neck supple. No JVD present. No thyromegaly present.  Cardiovascular: Regular rhythm, normal heart sounds and intact distal pulses. Exam reveals no gallop and no friction rub.  No murmur heard. Mild tachycardia  Pulmonary/Chest: Effort normal and breath sounds normal. No respiratory distress. He has no wheezes. He has no rales.  Abdominal: Soft. Bowel sounds are normal. He exhibits no distension and no mass. There is no tenderness.  Musculoskeletal: Normal range of motion. He exhibits no edema or tenderness.  Lymphadenopathy:    He has no cervical adenopathy.  Neurological: He is alert. Coordination normal.  Awake alert and following commands without difficulty, speech is clear, mentation is normal  Skin: Skin is warm and dry. No rash noted. No erythema.  Psychiatric: He has a normal mood and affect. His behavior is normal.  Nursing note and vitals reviewed.    ED Treatments / Results  Labs (all labs ordered are listed, but only abnormal results are displayed) Labs Reviewed  CBC WITH DIFFERENTIAL/PLATELET - Abnormal; Notable for the following components:      Result Value   RBC 3.46 (*)    Hemoglobin 6.7 (*)    HCT 25.2 (*)    MCV 72.8 (*)    MCH 19.4  (*)    MCHC 26.6 (*)    RDW 25.8 (*)    All other components within normal limits  COMPREHENSIVE METABOLIC PANEL - Abnormal; Notable for the following components:   CO2 18 (*)    Glucose, Bld 145 (*)    Calcium 8.4 (*)    Total Protein 6.3 (*)    Albumin 2.8 (*)    GFR calc non Af Amer 57 (*)    All other components within normal limits  PROTIME-INR  BASIC METABOLIC PANEL  TYPE AND SCREEN  PREPARE RBC (CROSSMATCH)   Radiology No results found.  Procedures .Critical Care Performed by: Noemi Chapel, MD Authorized by: Noemi Chapel, MD   Critical care provider statement:    Critical care time (minutes):  35   Critical care time was exclusive  of:  Separately billable procedures and treating other patients and teaching time   Critical care was necessary to treat or prevent imminent or life-threatening deterioration of the following conditions: severe anemia symptomatic.   Critical care was time spent personally by me on the following activities:  Blood draw for specimens, development of treatment plan with patient or surrogate, discussions with consultants, evaluation of patient's response to treatment, examination of patient, obtaining history from patient or surrogate, ordering and performing treatments and interventions, ordering and review of laboratory studies, ordering and review of radiographic studies, pulse oximetry, re-evaluation of patient's condition and review of old charts   (including critical care time)  Medications Ordered in ED Medications  0.9 %  sodium chloride infusion (not administered)  ondansetron (ZOFRAN) tablet 4 mg (not administered)    Or  ondansetron (ZOFRAN) injection 4 mg (not administered)  acetaminophen (TYLENOL) tablet 650 mg (not administered)    Or  acetaminophen (TYLENOL) suppository 650 mg (not administered)     Initial Impression / Assessment and Plan / ED Course  I have reviewed the triage vital signs and the nursing  notes.  Pertinent labs & imaging results that were available during my care of the patient were reviewed by me and considered in my medical decision making (see chart for details).    Vital signs show a CO2 of 18, otherwise an unremarkable metabolic panel including his renal function with a creatinine of 1.18, bilirubin of 0.6 and normal liver function test.  INR is 1.06 CBC was reportedly abnormal with an anemia and a hemoglobin of 6.7.  Transfusion started in ED, Pt cricitally ill with very low Hgb.  D/w Dr. Olevia Bowens who will admit    Final Clinical Impressions(s) / ED Diagnoses   Final diagnoses:  Symptomatic anemia      Noemi Chapel, MD 01/20/18 2147

## 2018-01-20 NOTE — ED Notes (Addendum)
Date and time results received: 01/20/18    Test: hgb Critical Value: 6.7  Name of Provider Notified: Dr. Sabra Heck notified at 2031  Orders Received? Or Actions Taken?: no/na

## 2018-01-20 NOTE — ED Triage Notes (Signed)
Pt states that PCP called and states his hemoglobin was abnormal.  Pt denies any symptoms.

## 2018-01-21 DIAGNOSIS — S3120XA Unspecified open wound of penis, initial encounter: Secondary | ICD-10-CM | POA: Diagnosis present

## 2018-01-21 DIAGNOSIS — D649 Anemia, unspecified: Secondary | ICD-10-CM | POA: Diagnosis not present

## 2018-01-21 LAB — CBC WITH DIFFERENTIAL/PLATELET
BASOS PCT: 0 %
Basophils Absolute: 0 10*3/uL (ref 0.0–0.1)
Eosinophils Absolute: 0.1 10*3/uL (ref 0.0–0.7)
Eosinophils Relative: 1 %
HCT: 31 % — ABNORMAL LOW (ref 39.0–52.0)
HEMOGLOBIN: 8.9 g/dL — AB (ref 13.0–17.0)
Lymphocytes Relative: 9 %
Lymphs Abs: 1 10*3/uL (ref 0.7–4.0)
MCH: 21.8 pg — AB (ref 26.0–34.0)
MCHC: 28.7 g/dL — ABNORMAL LOW (ref 30.0–36.0)
MCV: 75.8 fL — ABNORMAL LOW (ref 78.0–100.0)
MONOS PCT: 12 %
Monocytes Absolute: 1.4 10*3/uL — ABNORMAL HIGH (ref 0.1–1.0)
NEUTROS ABS: 8.9 10*3/uL — AB (ref 1.7–7.7)
NEUTROS PCT: 78 %
Platelets: 284 10*3/uL (ref 150–400)
RBC: 4.09 MIL/uL — ABNORMAL LOW (ref 4.22–5.81)
RDW: 23.1 % — ABNORMAL HIGH (ref 11.5–15.5)
WBC: 11.4 10*3/uL — ABNORMAL HIGH (ref 4.0–10.5)

## 2018-01-21 LAB — BASIC METABOLIC PANEL
ANION GAP: 10 (ref 5–15)
BUN: 14 mg/dL (ref 6–20)
CALCIUM: 8.3 mg/dL — AB (ref 8.9–10.3)
CHLORIDE: 107 mmol/L (ref 101–111)
CO2: 19 mmol/L — ABNORMAL LOW (ref 22–32)
Creatinine, Ser: 0.95 mg/dL (ref 0.61–1.24)
GFR calc non Af Amer: >60 mL/min (ref >60)
Glucose, Bld: 119 mg/dL — ABNORMAL HIGH (ref 65–99)
POTASSIUM: 3.6 mmol/L (ref 3.5–5.1)
Sodium: 136 mmol/L (ref 135–145)

## 2018-01-21 LAB — PREPARE RBC (CROSSMATCH)

## 2018-01-21 MED ORDER — SODIUM CHLORIDE 0.9 % IV SOLN: Freq: Once | INTRAVENOUS | Status: DC

## 2018-01-21 NOTE — Progress Notes (Signed)
Subjective: This is a 82 year old who has had problems with anemia GERD hyperlipidemia hypertension and vitamin B12 deficiency.  He had a severe life-threatening illness last month and was transferred from [facility] to [facility] because there were no critical care beds available in the immediate area except in [place].  While there he was intubated on mechanical ventilation had acute renal failure was septic and was in the intensive care unit for a prolonged period of time.  Those records are not available on care everywhere but I have reviewed them in my office when he was in my office last week and I will review them again today.  He does complain of trouble with urinating and I think that is from Foley catheter trauma.  This morning he says he feels back to normal and he did receive 2 units of packed red blood cells.  Hemoglobin level this morning is pending.  He refuses endoscopy.  Objective: Vital signs in last 24 hours: Temp:  [97.7 F (36.5 C)-98.8 F (37.1 C)] 97.7 F (36.5 C) (03/20 0720) Pulse Rate:  [77-89] 81 (03/20 0720) Resp:  [18-20] 20 (03/20 0720) BP: (111-137)/(51-84) 137/68 (03/20 0720) SpO2:  [97 %-100 %] 98 % (03/20 0720) Weight:  [56.1 kg (123 lb 10.9 oz)] 56.1 kg (123 lb 10.9 oz) (03/19 2256) Weight change:  Last BM Date: 01/20/18  Intake/Output from previous day: 03/19 0701 - 03/20 0700 In: 786 [P.O.:440; Blood:346] Out: 500 [Urine:500]  PHYSICAL EXAM General appearance: alert, cooperative and no distress Resp: clear to auscultation bilaterally Cardio: regular rate and rhythm, S1, S2 normal, no murmur, click, rub or gallop GI: soft, non-tender; bowel sounds normal; no masses,  no organomegaly Extremities: extremities normal, atraumatic, no cyanosis or edema  Lab Results:  Results for orders placed or performed during the hospital encounter of 01/20/18 (from the past 48 hour(s))  CBC with Differential     Status: Abnormal   Collection Time:  01/20/18  7:28 PM  Result Value Ref Range   WBC 8.8 4.0 - 10.5 K/uL   RBC 3.46 (L) 4.22 - 5.81 MIL/uL    Comment: Schistocytes present ANISOCYTES BURR CELLS    Hemoglobin 6.7 (LL) 13.0 - 17.0 g/dL    Comment: REPEATED TO VERIFY CRITICAL RESULT CALLED TO, READ BACK BY AND VERIFIED WITH: T.TALBOT '@2030'  ON 3.19.19 BY L.BOWMAN    HCT 25.2 (L) 39.0 - 52.0 %   MCV 72.8 (L) 78.0 - 100.0 fL   MCH 19.4 (L) 26.0 - 34.0 pg   MCHC 26.6 (L) 30.0 - 36.0 g/dL   RDW 25.8 (H) 11.5 - 15.5 %   Platelets 307 150 - 400 K/uL    Comment: SPECIMEN CHECKED FOR CLOTS PLATELET COUNT CONFIRMED BY SMEAR    Neutrophils Relative % 76 %   Neutro Abs 6.8 1.7 - 7.7 K/uL   Lymphocytes Relative 12 %   Lymphs Abs 1.0 0.7 - 4.0 K/uL   Monocytes Relative 11 %   Monocytes Absolute 0.9 0.1 - 1.0 K/uL   Eosinophils Relative 1 %   Eosinophils Absolute 0.1 0.0 - 0.7 K/uL   Basophils Relative 0 %   Basophils Absolute 0.0 0.0 - 0.1 K/uL    Comment: Performed at [facility], [address], [place], [place] [address]  Comprehensive metabolic panel     Status: Abnormal   Collection Time: 01/20/18  8:07 PM  Result Value Ref Range   Sodium 137 135 - 145 mmol/L   Potassium 3.8 3.5 - 5.1  mmol/L   Chloride 108 101 - 111 mmol/L   CO2 18 (L) 22 - 32 mmol/L   Glucose, Bld 145 (H) 65 - 99 mg/dL   BUN 17 6 - 20 mg/dL   Creatinine, Ser 1.18 0.61 - 1.24 mg/dL   Calcium 8.4 (L) 8.9 - 10.3 mg/dL   Total Protein 6.3 (L) 6.5 - 8.1 g/dL   Albumin 2.8 (L) 3.5 - 5.0 g/dL   AST 27 15 - 41 U/L   ALT 18 17 - 63 U/L   Alkaline Phosphatase 92 38 - 126 U/L   Total Bilirubin 0.6 0.3 - 1.2 mg/dL   GFR calc non Af Amer 57 (L) >60 mL/min   GFR calc Af Amer >60 >60 mL/min    Comment: (NOTE) The eGFR has been calculated using the CKD EPI equation. This calculation has not been validated in all clinical situations. eGFR's persistently <60 mL/min signify possible Chronic Kidney Disease.    Anion gap 11 5 - 15    Comment: Performed at  [facility], [address], [place], [place] [address]  Protime-INR     Status: None   Collection Time: 01/20/18  8:07 PM  Result Value Ref Range   Prothrombin Time 13.7 11.4 - 15.2 seconds   INR 1.06     Comment: Performed at [facility], [address] [place], [place] [address]  ABO/Rh     Status: None   Collection Time: 01/20/18  9:36 PM  Result Value Ref Range   ABO/RH(D)      A POS Performed at [facility], [address] [place], [place] [address]   Type and screen     Status: None (Preliminary result)   Collection Time: 01/20/18  9:38 PM  Result Value Ref Range   ABO/RH(D) A POS    Antibody Screen NEG    Sample Expiration 01/23/2018    Unit Number [id number]    Blood Component Type RED CELLS,LR    Unit division 00    Status of Unit ISSUED    Transfusion Status OK TO TRANSFUSE    Crossmatch Result      Compatible Performed at [facility], [address], [place], [place] [address]    Unit Number [id number]    Blood Component Type RBC LR PHER1    Unit division 00    Status of Unit ISSUED    Transfusion Status OK TO TRANSFUSE    Crossmatch Result Compatible   Prepare RBC     Status: None   Collection Time: 01/20/18  9:38 PM  Result Value Ref Range   Order Confirmation      ORDER PROCESSED BY BLOOD BANK Performed at [facility], [address], [place], [place] [address]     ABGS No results for input(s): PHART, PO2ART, TCO2, HCO3 in the last 72 hours.  Invalid input(s): PCO2 CULTURES No results found for this or any previous visit (from the past 240 hour(s)). Studies/Results: No results found.  Medications:  Prior to Admission:  Medications Prior to Admission  Medication Sig Dispense Refill Last Dose  . amLODipine (NORVASC) 5 MG tablet Take 5 mg by mouth daily.   Taking  . aspirin EC 81 MG tablet Take 81 mg by mouth daily.    Taking  . atorvastatin (LIPITOR) 10 MG tablet Take 10 mg by mouth daily at 6 PM.   Taking  . carvedilol (COREG) 12.5 MG  tablet Take 12.5 mg by mouth 2 (two) times daily with a meal.  Taking  . ferrous sulfate 325 (65 FE) MG tablet Take 325 mg by mouth.   Taking  . pantoprazole (PROTONIX) 40 MG tablet Take 40 mg by mouth 2 (two) times daily.   Not Taking  . thiamine 100 MG tablet Take 100 mg by mouth daily.   Taking   Scheduled:  Continuous:  VNR:[id number] **OR** acetaminophen, ondansetron **OR** ondansetron (ZOFRAN) IV  Assesment: He was admitted with symptomatic anemia.  I think this is probably related to his severe life-threatening acute illness of last month.  He denies any dark stools blood in his urine although he does have a penile lesion, no hematochezia.  He is back to baseline  He does have history of vitamin B12 deficiency but he is on replacement  He has hypertension which is well controlled  He has history of reflux which is stable  He has an open wound on his penis from Foley trauma and he is going to continue the cream he is using Principal Problem:   Symptomatic anemia Active Problems:   Hypertension   Hypercholesteremia   GERD (gastroesophageal reflux disease)   Vitamin B deficiency   Open wound of penis    Plan: Check hemoglobin level.  He probably can go home later today.  He does not warrant further workup at this point.    LOS: 0 days   Rosario Duey L 01/21/2018, 8:29 AM

## 2018-01-21 NOTE — Progress Notes (Signed)
Patient given discharge instructions at bedside. Home Health set up.

## 2018-01-21 NOTE — Progress Notes (Signed)
This RN called lab @ 2300 to see if blood was ready. Kaylee from lab stated that she was running the antibody screen once again d/t it may being a false positive. Stated she would call me back when blood is ready.

## 2018-01-21 NOTE — Discharge Summary (Signed)
Physician Discharge Summary  Patient ID: Jared Schmidt MRN: [id number] DOB/AGE: November 12, 1935 82 y.o. Primary Care Physician:Jared Schmidt, Jared Dredge, MD Admit date: 01/20/2018 Discharge date: 01/21/2018    Discharge Diagnoses:   Principal Problem:   Symptomatic anemia Active Problems:   Hypertension   Hypercholesteremia   GERD (gastroesophageal reflux disease)   Vitamin B deficiency   Open wound of penis   Allergies as of 01/21/2018   No Active Allergies     Medication List    TAKE these medications   amLODipine 5 MG tablet Commonly known as:  NORVASC Take 5 mg by mouth daily.   aspirin EC 81 MG tablet Take 81 mg by mouth daily.   atorvastatin 10 MG tablet Commonly known as:  LIPITOR Take 10 mg by mouth daily at 6 PM.   carvedilol 12.5 MG tablet Commonly known as:  COREG Take 12.5 mg by mouth 2 (two) times daily with a meal.   ferrous sulfate 325 (65 FE) MG tablet Take 325 mg by mouth.   pantoprazole 40 MG tablet Commonly known as:  PROTONIX Take 40 mg by mouth 2 (two) times daily.   thiamine 100 MG tablet Take 100 mg by mouth daily.       Discharged Condition: Improved    Consults: None  Significant Diagnostic Studies: No results found.  Lab Results: Basic Metabolic Panel: Recent Labs    01/20/18 2007  NA 137  K 3.8  CL 108  CO2 18*  GLUCOSE 145*  BUN 17  CREATININE 1.18  CALCIUM 8.4*   Liver Function Tests: Recent Labs    01/20/18 2007  AST 27  ALT 18  ALKPHOS 92  BILITOT 0.6  PROT 6.3*  ALBUMIN 2.8*     CBC: Recent Labs    01/20/18 1928  WBC 8.8  NEUTROABS 6.8  HGB 6.7*  HCT 25.2*  MCV 72.8*  PLT 307    No results found for this or any previous visit (from the past 240 hour(s)).   Hospital Course: This is a 82 year old who is known to have GERD hyperlipidemia hypertension and vitamin B12 deficiency and also had a severe life-threatening illness last month.  He was sent to [facility] because there were no critical  care beds available in the immediate area except in [place].  He was critically ill with respiratory failure requiring ventilator support acute renal failure sepsis and he was in the intensive care unit for a prolonged period of time.  He has been home for about 3 weeks and had been doing well with home health support and I had him get a CBC which showed that his hemoglobin level was less than 7.  I told him to come to the hospital for blood transfusion as he was having trouble with weakness.  He did receive blood and his hemoglobin level post transfusion is pending at this point but he feels much better.  He refuses any workup including endoscopy or colonoscopy.  Considering that I think he is at maximum hospital benefit and ready for discharge presuming that his hemoglobin level is okay  Discharge Exam: Blood pressure 137/68, pulse 81, temperature 97.7 F (36.5 C), temperature source Oral, resp. rate 20, height 5\' 7"  (1.702 m), weight 56.1 kg (123 lb 10.9 oz), SpO2 98 %. He is awake and alert.  His chest is clear.  He has a heart murmur which is unchanged.  His abdomen is soft.  Disposition: Home.  Resume home health services      Signed:  Kennis Schmidt L   01/21/2018, 8:42 AM

## 2018-01-21 NOTE — Progress Notes (Signed)
Late entry from 2300 on 01/20/18-Pt has raw, reddened, open area on penis that he and his wife state is from a catheter where he was intubated d/t sepsis at [facility] for a long time. RN made Dr Olevia Bowens aware who took a photo of the sore. This RN had CN Kristi Marcello Moores look at the penis area. Area not a pressure ulcer, but it does look raw. Will continue to monitor

## 2018-01-22 LAB — BPAM RBC
BLOOD PRODUCT EXPIRATION DATE: 201903312359
BLOOD PRODUCT EXPIRATION DATE: 201904082359
Blood Product Expiration Date: 201904052359
ISSUE DATE / TIME: 201903200051
ISSUE DATE / TIME: 201903200439
ISSUE DATE / TIME: 201903201200
UNIT TYPE AND RH: 6200
Unit Type and Rh: 600
Unit Type and Rh: 6200

## 2018-01-22 LAB — TYPE AND SCREEN
ABO/RH(D): A POS
ANTIBODY SCREEN: NEGATIVE
UNIT DIVISION: 0
UNIT DIVISION: 0
Unit division: 0

## 2018-02-04 DIAGNOSIS — N4889 Other specified disorders of penis: Secondary | ICD-10-CM | POA: Diagnosis not present

## 2018-02-04 DIAGNOSIS — E872 Acidosis: Secondary | ICD-10-CM | POA: Diagnosis not present

## 2018-02-10 ENCOUNTER — Other Ambulatory Visit: Payer: Self-pay | Admitting: *Deleted

## 2018-02-10 ENCOUNTER — Ambulatory Visit: Payer: Self-pay | Admitting: *Deleted

## 2018-02-10 NOTE — Patient Outreach (Signed)
Telephone call to pt, spoke with pt, HIPAA verified, home visit rescheduled for week of 02/23/18.  PLAN See pt for home visit this month  Jacqlyn Larsen [facility], [place] Coordinator [phone]

## 2018-02-17 DIAGNOSIS — I10 Essential (primary) hypertension: Secondary | ICD-10-CM | POA: Diagnosis not present

## 2018-02-17 DIAGNOSIS — E785 Hyperlipidemia, unspecified: Secondary | ICD-10-CM | POA: Diagnosis not present

## 2018-02-25 ENCOUNTER — Encounter: Payer: Self-pay | Admitting: *Deleted

## 2018-02-25 ENCOUNTER — Other Ambulatory Visit: Payer: Self-pay | Admitting: *Deleted

## 2018-02-25 NOTE — Patient Outreach (Signed)
[facility]) Care Management   02/25/2018  TOMIO KIRK 02-12-1936 [id number]  Jared Schmidt is an 82 y.o. male  Subjective: Routine home visit with pt, HIPAA verified, pt reports "doing well, can't think of a thing I need"  Pt had blood transfusions in March for chronic anemia.  Pt reports home health discharged, no medication changes.  Objective:   Vitals:   02/25/18 1207 02/25/18 1219  BP: 126/66   Pulse: 76   Resp: 18   SpO2: 92%   Weight:  134 lb (60.8 kg)   ROS  Physical Exam  Constitutional: He is oriented to person, place, and time. He appears well-developed and well-nourished.  HENT:  Head: Normocephalic.  Neck: Normal range of motion. Neck supple.  Cardiovascular: Normal rate and regular rhythm.  Respiratory: Effort normal and breath sounds normal.  GI: Soft. Bowel sounds are normal.  Musculoskeletal: Normal range of motion. He exhibits no edema.  Neurological: He is alert and oriented to person, place, and time.  Skin: Skin is warm and dry.  Psychiatric: He has a normal mood and affect. His behavior is normal. Thought content normal.    Encounter Medications:   Outpatient Encounter Medications as of 02/25/2018  Medication Sig  . amLODipine (NORVASC) 5 MG tablet Take 5 mg by mouth daily.  Marland Kitchen aspirin EC 81 MG tablet Take 81 mg by mouth daily.   Marland Kitchen atorvastatin (LIPITOR) 10 MG tablet Take 10 mg by mouth daily at 6 PM.  . carvedilol (COREG) 12.5 MG tablet Take 12.5 mg by mouth 2 (two) times daily with a meal.  . ferrous sulfate 325 (65 FE) MG tablet Take 325 mg by mouth.  . pantoprazole (PROTONIX) 40 MG tablet Take 40 mg by mouth 2 (two) times daily.  Marland Kitchen thiamine 100 MG tablet Take 100 mg by mouth daily.   No facility-administered encounter medications on file as of 02/25/2018.     Functional Status:   In your present state of health, do you have any difficulty performing the following activities: 01/20/2018 01/12/2018  Hearing? N N  Vision? N N   Difficulty concentrating or making decisions? N N  Walking or climbing stairs? Y Y  Dressing or bathing? N N  Doing errands, shopping? N Y  Comment Wife usually goes with pt -  Conservation officer, nature and eating ? - N  Using the Toilet? - N  In the past six months, have you accidently leaked urine? - N  Do you have problems with loss of bowel control? - N  Managing your Medications? - Y  Managing your Finances? - N  Housekeeping or managing your Housekeeping? - Y  Some recent data might be hidden    Fall/Depression Screening:    Fall Risk  02/25/2018 01/12/2018 12/30/2017  Falls in the past year? No No No  Risk for fall due to : - Medication side effect Impaired mobility   PHQ 2/9 Scores 01/12/2018 12/30/2017  PHQ - 2 Score 0 -  Exception Documentation - (No Data)    Assessment:  Wife present and assists pt as needed.  Pt agitated at questions asked by RN CM citing the answers should already be known, pt agreeable with discharge, feels he has met goals and has no other needs. RN CM mailed case closure letter to pt home and faxed letter to primary MD .  [facility] CM Care Plan Problem One     Most Recent Value  Care Plan Problem One  Knowledge deficit  related identifying symptoms infection/ sepsis  Role Documenting the Problem One  Care Management [place] for Problem One  Active  [facility] Long Term Goal   Pt will demonstrate/ verbalize improved self care, symptom management related to sepsis within 60 days  [facility] Long Term Goal Start Date  01/12/18  [facility] Long Term Goal Met Date  02/25/18  Interventions for Problem One Long Term Goal  RN CM reviewed importance of keeping hemoglobin checked as pt was anemic (below 7) in March and received blood transfusion, reviewed medications.  [facility] CM Short Term Goal #1   pt will verbalize signs/ symptoms infection/ sepsis within 30 days  [facility] CM Short Term Goal #1 Start Date  01/12/18  [facility] CM Short Term Goal #1 Met Date  02/25/18  Interventions for Short  Term Goal #1  RN CM reviewed signs/ symptoms sepis/ infection, importance of calling MD early for changes in health status  [facility] CM Short Term Goal #2   Pt will verbalize ways to prevent infection within 30 days  [facility] CM Short Term Goal #2 Start Date  01/12/18  [facility] CM Short Term Goal #2 Met Date  02/25/18  Interventions for Short Term Goal #2  Pt verbalizes handwashing, wearing mask as needed, pulmonary hygeine, avoiding sick persons      Plan: close case today  Jacqlyn Larsen [facility], [place] Coordinator [phone]

## 2018-03-03 ENCOUNTER — Encounter (HOSPITAL_COMMUNITY)
Admission: RE | Admit: 2018-03-03 | Discharge: 2018-03-03 | Disposition: A | Discharge status: Home / Self Care | Payer: PPO | Source: Ambulatory Visit | Attending: Surgery of the Hand | Admitting: Surgery of the Hand

## 2018-03-03 DIAGNOSIS — D649 Anemia, unspecified: Secondary | ICD-10-CM | POA: Insufficient documentation

## 2018-03-03 DIAGNOSIS — N189 Chronic kidney disease, unspecified: Secondary | ICD-10-CM | POA: Diagnosis not present

## 2018-03-03 LAB — HEMOGLOBIN AND HEMATOCRIT, BLOOD
HEMATOCRIT: 22.1 % — AB (ref 39.0–52.0)
HEMOGLOBIN: 6 g/dL — AB (ref 13.0–17.0)

## 2018-03-03 LAB — PREPARE RBC (CROSSMATCH)

## 2018-03-04 ENCOUNTER — Encounter (HOSPITAL_COMMUNITY)
Admission: RE | Admit: 2018-03-04 | Discharge: 2018-03-04 | Disposition: A | Discharge status: Home / Self Care | Payer: PPO | Source: Ambulatory Visit | Attending: Pulmonary Disease | Admitting: Pulmonary Disease

## 2018-03-04 ENCOUNTER — Encounter (HOSPITAL_COMMUNITY): Payer: Self-pay

## 2018-03-04 DIAGNOSIS — D509 Iron deficiency anemia, unspecified: Secondary | ICD-10-CM | POA: Diagnosis not present

## 2018-03-04 MED ORDER — SODIUM CHLORIDE 0.9 % IV SOLN
Freq: Once | INTRAVENOUS | Status: AC
  Administered 2018-03-04: 08:00:00 via INTRAVENOUS

## 2018-03-05 LAB — TYPE AND SCREEN
ABO/RH(D): A POS
Antibody Screen: NEGATIVE
UNIT DIVISION: 0
Unit division: 0

## 2018-03-05 LAB — BPAM RBC
BLOOD PRODUCT EXPIRATION DATE: 201905282359
Blood Product Expiration Date: 201905282359
ISSUE DATE / TIME: 201905010736
ISSUE DATE / TIME: 201905010913
UNIT TYPE AND RH: 6200
Unit Type and Rh: 6200

## 2018-03-10 ENCOUNTER — Encounter (HOSPITAL_COMMUNITY): Payer: Self-pay | Admitting: Emergency Medicine

## 2018-03-16 ENCOUNTER — Encounter (HOSPITAL_COMMUNITY): Payer: Self-pay | Admitting: Internal Medicine

## 2018-03-16 ENCOUNTER — Inpatient Hospital Stay (HOSPITAL_COMMUNITY): Payer: PPO

## 2018-03-16 ENCOUNTER — Inpatient Hospital Stay (HOSPITAL_COMMUNITY): Payer: PPO | Attending: Internal Medicine | Admitting: Internal Medicine

## 2018-03-16 VITALS — BP 142/79 | HR 79 | Temp 97.8°F | Resp 18 | Wt 135.3 lb

## 2018-03-16 DIAGNOSIS — E538 Deficiency of other specified B group vitamins: Secondary | ICD-10-CM | POA: Insufficient documentation

## 2018-03-16 DIAGNOSIS — Z87891 Personal history of nicotine dependence: Secondary | ICD-10-CM | POA: Insufficient documentation

## 2018-03-16 DIAGNOSIS — D508 Other iron deficiency anemias: Secondary | ICD-10-CM

## 2018-03-16 DIAGNOSIS — D472 Monoclonal gammopathy: Secondary | ICD-10-CM | POA: Diagnosis not present

## 2018-03-16 DIAGNOSIS — I10 Essential (primary) hypertension: Secondary | ICD-10-CM | POA: Diagnosis not present

## 2018-03-16 DIAGNOSIS — D509 Iron deficiency anemia, unspecified: Secondary | ICD-10-CM | POA: Diagnosis not present

## 2018-03-16 LAB — COMPREHENSIVE METABOLIC PANEL
ALT: 17 U/L (ref 17–63)
AST: 21 U/L (ref 15–41)
Albumin: 3.7 g/dL (ref 3.5–5.0)
Alkaline Phosphatase: 88 U/L (ref 38–126)
Anion gap: 7 (ref 5–15)
BILIRUBIN TOTAL: 0.5 mg/dL (ref 0.3–1.2)
BUN: 16 mg/dL (ref 6–20)
CO2: 24 mmol/L (ref 22–32)
Calcium: 9.3 mg/dL (ref 8.9–10.3)
Chloride: 105 mmol/L (ref 101–111)
Creatinine, Ser: 1.02 mg/dL (ref 0.61–1.24)
Glucose, Bld: 129 mg/dL — ABNORMAL HIGH (ref 65–99)
POTASSIUM: 4.5 mmol/L (ref 3.5–5.1)
Sodium: 136 mmol/L (ref 135–145)
TOTAL PROTEIN: 6.9 g/dL (ref 6.5–8.1)

## 2018-03-16 LAB — CBC WITH DIFFERENTIAL/PLATELET
Basophils Absolute: 0.1 10*3/uL (ref 0.0–0.1)
Basophils Relative: 1 %
EOS PCT: 3 %
Eosinophils Absolute: 0.3 10*3/uL (ref 0.0–0.7)
HEMATOCRIT: 27.6 % — AB (ref 39.0–52.0)
Hemoglobin: 7.7 g/dL — ABNORMAL LOW (ref 13.0–17.0)
LYMPHS ABS: 1.1 10*3/uL (ref 0.7–4.0)
Lymphocytes Relative: 13 %
MCH: 20 pg — AB (ref 26.0–34.0)
MCHC: 27.9 g/dL — AB (ref 30.0–36.0)
MCV: 71.7 fL — AB (ref 78.0–100.0)
MONO ABS: 1.2 10*3/uL — AB (ref 0.1–1.0)
MONOS PCT: 14 %
NEUTROS ABS: 5.7 10*3/uL (ref 1.7–7.7)
Neutrophils Relative %: 69 %
PLATELETS: 378 10*3/uL (ref 150–400)
RBC: 3.85 MIL/uL — ABNORMAL LOW (ref 4.22–5.81)
RDW: 26.3 % — AB (ref 11.5–15.5)
WBC: 8.4 10*3/uL (ref 4.0–10.5)

## 2018-03-16 LAB — FOLATE: FOLATE: 57.9 ng/mL (ref >5.9)

## 2018-03-16 LAB — FERRITIN: Ferritin: 5 ng/mL — ABNORMAL LOW (ref 24–336)

## 2018-03-16 LAB — LACTATE DEHYDROGENASE: LDH: 144 U/L (ref 98–192)

## 2018-03-16 LAB — VITAMIN B12: VITAMIN B 12: 848 pg/mL (ref 180–914)

## 2018-03-16 NOTE — Patient Instructions (Signed)
[facility]  Discharge Instructions: You saw Dr. Higgs today                               _______________________________________________________________  Thank you for choosing [facility] at [facility] to provide your oncology and hematology care.  To afford each patient quality time with our providers, please arrive at least 15 minutes before your scheduled appointment.  You need to re-schedule your appointment if you arrive 10 or more minutes late.  We strive to give you quality time with our providers, and arriving late affects you and other patients whose appointments are after yours.  Also, if you no show three or more times for appointments you may be dismissed from the clinic.  Again, thank you for choosing [facility] at [facility]. Our hope is that these requests will allow you access to exceptional care and in a timely manner. _______________________________________________________________  If you have questions after your visit, please contact our office at [phone] between the hours of 8:30 a.m. and 5:00 p.m. Voicemails left after 4:30 p.m. will not be returned until the following business day. _______________________________________________________________  For prescription refill requests, have your pharmacy contact our office. _______________________________________________________________  Recommendations made by the consultant and any test results will be sent to your referring physician. _______________________________________________________________ 

## 2018-03-16 NOTE — Progress Notes (Signed)
Diagnosis Other iron deficiency anemia - Plan: CBC with Differential/Platelet, Comprehensive metabolic panel, Lactate dehydrogenase, Protein electrophoresis, serum, Ferritin, Haptoglobin, Hemoglobinopathy evaluation, Vitamin B12, Folate, Type and screen, CBC with Differential/Platelet, Comprehensive metabolic panel, Lactate dehydrogenase, Ferritin  Staging Cancer Staging No matching staging information was found for the patient.  Referring physician Jasper Loser. Luan Pulling, [place], [place] [address]  Cc:  Dr. Luan Pulling   Assessment and Plan: 1.  Microcytic anemia. 82 year old male referred by Dr. Luan Pulling for evaluation of anemia.  The patient was seen in the emergency room due to severe anemia.  He reported he had been feeling tired, also dizzy.  He had been seen at [facility] him ER and was transferred to [facility] for evaluation.  Labs done showed a white count of 8.6 hemoglobin 6.7 platelets 307,000.  PT INR was within normal limits.  Potassium was 3.8 bicarb was 18 creatinine was 1.18.  Labs done 01/21/2018 showed a white count 11.4 hemoglobin 8.9 platelets 284,000.  MCV was 75.  H&H done 03/03/2018 showed a hemoglobin of 6.  He was last transfused 03/03/2018.  He reports craving ice.  He denies any blood in his stool or his urine.  He had been offered GI evaluation but refused GI evaluation.  He reports he is no longer taking iron supplements.  He denies any family history of leukemia, lymphoma, hemoglobinopathy.  He was a longtime smoker reportedly last smoked in February 2019.  Patient is seen today for consultation due to microcytic anemia.  He reports he is feeling better since transfusion.  Labs done today 03/16/2018 showed a white count 8.4 hemoglobin 7.7 MCV 72 platelets 388,000.  Creatinine is 1.02.  Awaiting the results of his anemia work-up with hemoglobin electrophoresis, SPEP, B12, folate, haptoglobin, LDH, ferritin, type and screen.  If iron levels are decreased he will  be given option of IV iron due to the severity of his anemia and the fact that he is no longer taking his iron tablets..  I have also discussed with him due to the severity of his anemia he will be recommended for GI evaluation for endoscopy and colonoscopy.  He is now willing to have that consultation done.  He will be notified of his iron results and will return to clinic in 2 to 3 weeks for follow-up and repeat labs.  2.  Respiratory failure requiring intubation.  Patient denies any respiratory symptoms today.  He was recently hospitalized in [place] and reportedly was intubated.  Pulse ox today in clinic is 100% on room air.  He should continue to follow with Dr. Luan Pulling.  3.  Hypertension.  Blood pressure is 142/79.  Follow-up with PCP.  4.  B12 deficiency.  Will check B12 levels.  He is referred to GI for evaluation due to the severity of his anemia.  5.  Smoking.  Patient reports he has discontinued smoking in February 2019.  Will consider chest imaging.  6.  Pica.  Patient reports craving ice.  This is usually a symptom of iron deficiency anemia.  Awaiting ferritin results.  Will determine if symptoms improve once anemia improves.   HPI: 82 year old male referred by Dr. Luan Pulling for evaluation of anemia.  The patient was seen in the emergency room due to severe anemia.  He reported he had been feeling tired, also dizzy.  He had been seen at [place] and was transferred to [facility] for evaluation.  Labs done showed a white count of 8.6 hemoglobin  6.7 platelets 307,000.  PT INR was within normal limits.  Potassium was 3.8 bicarb was 18 creatinine was 1.18.  Labs done 01/21/2018 showed a white count 11.4 hemoglobin 8.9 platelets 284,000.  MCV was 75.  H&H done 03/03/2018 showed a hemoglobin of 6.  He was last transfused 03/03/2018.  He reports craving ice.  He denies any blood in his stool or his urine.  He had been offered GI evaluation but refused GI evaluation.  He reports he is  no longer taking iron supplements.  He denies any family history of leukemia, lymphoma, hemoglobinopathy.  He was a longtime smoker reportedly last smoked in February 2019.  He reports he was hospitalized in [place] for shortness of breath and was intubated.  Patient is seen today for consultation due to microcytic anemia.  Problem List Patient Active Problem List   Diagnosis Date Noted  . Open wound of penis [S31.20XA] 01/21/2018  . Symptomatic anemia [D64.9] 01/20/2018  . Hypertension [I10] 01/20/2018  . Hypercholesteremia [E78.00] 01/20/2018  . GERD (gastroesophageal reflux disease) [K21.9] 01/20/2018  . Vitamin B deficiency [E53.9] 01/20/2018    Past Medical History Past Medical History:  Diagnosis Date  . Anemia   . GERD (gastroesophageal reflux disease)   . Hypercholesteremia   . Hypertension   . Vitamin B deficiency     Past Surgical History Past Surgical History:  Procedure Laterality Date  . BACK SURGERY      Family History Family History  Family history unknown: Yes     Social History  reports that he quit smoking about 3 months ago. His smoking use included cigarettes. He has a 33.50 pack-year smoking history. He has never used smokeless tobacco. He reports that he does not drink alcohol or use drugs.  Medications  Current Outpatient Medications:  .  multivitamin-iron-minerals-folic acid (CENTRUM) chewable tablet, Chew 1 tablet by mouth daily., Disp: , Rfl:  .  vitamin C (ASCORBIC ACID) 500 MG tablet, Take 500 mg by mouth daily., Disp: , Rfl:  .  vitamin E 400 UNIT capsule, Take 800 Units by mouth daily., Disp: , Rfl:  .  amLODipine (NORVASC) 5 MG tablet, Take 5 mg by mouth daily., Disp: , Rfl:  .  aspirin EC 81 MG tablet, Take 81 mg by mouth daily. , Disp: , Rfl:  .  atorvastatin (LIPITOR) 10 MG tablet, Take 10 mg by mouth daily at 6 PM., Disp: , Rfl:  .  carvedilol (COREG) 12.5 MG tablet, Take 12.5 mg by mouth 2 (two) times daily with a meal., Disp: , Rfl:   .  pantoprazole (PROTONIX) 40 MG tablet, Take 40 mg by mouth 2 (two) times daily., Disp: , Rfl:  .  thiamine 100 MG tablet, Take 100 mg by mouth daily., Disp: , Rfl:   Allergies Patient has no active allergies.  Review of Systems Review of Systems - Oncology ROS as per HPI otherwise 12 point ROS is negative.   Physical Exam  Vitals Wt Readings from Last 3 Encounters:  03/16/18 135 lb 4.8 oz (61.4 kg)  02/25/18 134 lb (60.8 kg)  01/20/18 123 lb 10.9 oz (56.1 kg)   Temp Readings from Last 3 Encounters:  03/16/18 97.8 F (36.6 C) (Oral)  03/04/18 97.6 F (36.4 C) (Oral)  01/21/18 98.7 F (37.1 C) (Oral)   BP Readings from Last 3 Encounters:  03/16/18 (!) 142/79  03/04/18 (!) 148/65  02/25/18 126/66   Pulse Readings from Last 3 Encounters:  03/16/18 79  03/04/18 72  02/25/18 76    Constitutional: Well-developed, well-nourished, and in no distress.   HENT: Head: Normocephalic and atraumatic.  Mouth/Throat: No oropharyngeal exudate. Mucosa moist. Eyes: Pupils are equal, round, and reactive to light. Conjunctivae are normal. No scleral icterus.  Neck: Normal range of motion. Neck supple. No JVD present.  Cardiovascular: Normal rate, regular rhythm and normal heart sounds.  Exam reveals no gallop and no friction rub.   No murmur heard. Pulmonary/Chest: Effort normal and breath sounds normal. No respiratory distress. No wheezes.No rales.  Abdominal: Soft. Bowel sounds are normal. No distension. There is no tenderness. There is no guarding.  Musculoskeletal: No edema or tenderness.  Lymphadenopathy: No cervical, axillary or supraclavicular adenopathy.  Neurological: Alert and oriented to person, place, and time. No cranial nerve deficit.  Skin: Skin is warm and dry. No rash noted. No erythema. No pallor. Left chest wall darkening Psychiatric: Affect and judgment normal.   Labs Appointment on 03/16/2018  Component Date Value Ref Range Status  . WBC 03/16/2018 8.4  4.0 -  10.5 K/uL Final  . RBC 03/16/2018 3.85* 4.22 - 5.81 MIL/uL Final  . Hemoglobin 03/16/2018 7.7* 13.0 - 17.0 g/dL Final  . HCT 03/16/2018 27.6* 39.0 - 52.0 % Final  . MCV 03/16/2018 71.7* 78.0 - 100.0 fL Final  . MCH 03/16/2018 20.0* 26.0 - 34.0 pg Final  . MCHC 03/16/2018 27.9* 30.0 - 36.0 g/dL Final  . RDW 03/16/2018 26.3* 11.5 - 15.5 % Final  . Platelets 03/16/2018 378  150 - 400 K/uL Final   Comment: PLATELET COUNT CONFIRMED BY SMEAR GIANT PLATELETS SEEN   . Neutrophils Relative % 03/16/2018 69  % Final  . Lymphocytes Relative 03/16/2018 13  % Final  . Monocytes Relative 03/16/2018 14  % Final  . Eosinophils Relative 03/16/2018 3  % Final  . Basophils Relative 03/16/2018 1  % Final  . Neutro Abs 03/16/2018 5.7  1.7 - 7.7 K/uL Final  . Lymphs Abs 03/16/2018 1.1  0.7 - 4.0 K/uL Final  . Monocytes Absolute 03/16/2018 1.2* 0.1 - 1.0 K/uL Final  . Eosinophils Absolute 03/16/2018 0.3  0.0 - 0.7 K/uL Final  . Basophils Absolute 03/16/2018 0.1  0.0 - 0.1 K/uL Final  . RBC Morphology 03/16/2018 ANISOCYTES   Final   Comment: Schistocytes present BURR CELLS TARGET CELLS POLYCHROMASIA PRESENT Performed at [facility], [address] [place], [place] [address]   . Sodium 03/16/2018 136  135 - 145 mmol/L Final  . Potassium 03/16/2018 4.5  3.5 - 5.1 mmol/L Final  . Chloride 03/16/2018 105  101 - 111 mmol/L Final  . CO2 03/16/2018 24  22 - 32 mmol/L Final  . Glucose, Bld 03/16/2018 129* 65 - 99 mg/dL Final  . BUN 03/16/2018 16  6 - 20 mg/dL Final  . Creatinine, Ser 03/16/2018 1.02  0.61 - 1.24 mg/dL Final  . Calcium 03/16/2018 9.3  8.9 - 10.3 mg/dL Final  . Total Protein 03/16/2018 6.9  6.5 - 8.1 g/dL Final  . Albumin 03/16/2018 3.7  3.5 - 5.0 g/dL Final  . AST 03/16/2018 21  15 - 41 U/L Final  . ALT 03/16/2018 17  17 - 63 U/L Final  . Alkaline Phosphatase 03/16/2018 88  38 - 126 U/L Final  . Total Bilirubin 03/16/2018 0.5  0.3 - 1.2 mg/dL Final  . GFR calc non Af Amer 03/16/2018 >60   >60 mL/min Final  . GFR calc Af Amer 03/16/2018 >60  >60 mL/min Final   Comment: (NOTE)  The eGFR has been calculated using the CKD EPI equation. This calculation has not been validated in all clinical situations. eGFR's persistently <60 mL/min signify possible Chronic Kidney Disease.   Georgiann Hahn gap 03/16/2018 7  5 - 15 Final   Performed at [facility], [address], [place], [place] [address]  . LDH 03/16/2018 144  98 - 192 U/L Final   Performed at [facility], [address], [place], [place] [address]     Pathology Orders Placed This Encounter  Procedures  . CBC with Differential/Platelet    Standing Status:   Future    Number of Occurrences:   1    Standing Expiration Date:   03/17/2019  . Comprehensive metabolic panel    Standing Status:   Future    Number of Occurrences:   1    Standing Expiration Date:   03/17/2019  . Lactate dehydrogenase    Standing Status:   Future    Number of Occurrences:   1    Standing Expiration Date:   03/17/2019  . Protein electrophoresis, serum    Standing Status:   Future    Number of Occurrences:   1    Standing Expiration Date:   03/17/2019  . Ferritin    Standing Status:   Future    Number of Occurrences:   1    Standing Expiration Date:   03/17/2019  . Haptoglobin    Standing Status:   Future    Number of Occurrences:   1    Standing Expiration Date:   03/16/2019  . Hemoglobinopathy evaluation    Standing Status:   Future    Number of Occurrences:   1    Standing Expiration Date:   03/16/2019  . Vitamin B12    Standing Status:   Future    Number of Occurrences:   1    Standing Expiration Date:   03/16/2019  . Folate    Standing Status:   Future    Number of Occurrences:   1    Standing Expiration Date:   03/16/2019  . CBC with Differential/Platelet    Standing Status:   Future    Standing Expiration Date:   03/17/2019  . Comprehensive metabolic panel    Standing Status:   Future    Standing Expiration Date:   03/17/2019  .  Lactate dehydrogenase    Standing Status:   Future    Standing Expiration Date:   03/17/2019  . Ferritin    Standing Status:   Future    Standing Expiration Date:   03/17/2019  . Type and screen    Standing Status:   Future    Standing Expiration Date:   03/16/2019       Zoila Shutter MD

## 2018-03-17 ENCOUNTER — Other Ambulatory Visit (HOSPITAL_COMMUNITY): Payer: Self-pay | Admitting: Internal Medicine

## 2018-03-17 ENCOUNTER — Encounter (HOSPITAL_COMMUNITY): Payer: Self-pay | Admitting: Internal Medicine

## 2018-03-17 DIAGNOSIS — D500 Iron deficiency anemia secondary to blood loss (chronic): Secondary | ICD-10-CM

## 2018-03-17 HISTORY — DX: Iron deficiency anemia secondary to blood loss (chronic): D50.0

## 2018-03-17 LAB — PROTEIN ELECTROPHORESIS, SERUM
A/G Ratio: 1.2 (ref 0.7–1.7)
ALBUMIN ELP: 3.4 g/dL (ref 2.9–4.4)
Alpha-1-Globulin: 0.3 g/dL (ref 0.0–0.4)
Alpha-2-Globulin: 0.7 g/dL (ref 0.4–1.0)
BETA GLOBULIN: 1 g/dL (ref 0.7–1.3)
GAMMA GLOBULIN: 0.9 g/dL (ref 0.4–1.8)
Globulin, Total: 2.9 g/dL (ref 2.2–3.9)
M-SPIKE, %: 0.2 g/dL — AB
TOTAL PROTEIN ELP: 6.3 g/dL (ref 6.0–8.5)

## 2018-03-17 LAB — HAPTOGLOBIN: HAPTOGLOBIN: 127 mg/dL (ref 34–200)

## 2018-03-18 ENCOUNTER — Other Ambulatory Visit (HOSPITAL_COMMUNITY): Payer: Self-pay | Admitting: Pharmacist

## 2018-03-18 LAB — HEMOGLOBINOPATHY EVALUATION
HGB F QUANT: 0 % (ref 0.0–2.0)
HGB S QUANTITAION: 0 %
HGB VARIANT: 0 %
Hgb A2 Quant: 1.8 % (ref 1.8–3.2)
Hgb A: 98.2 % (ref 96.4–98.8)
Hgb C: 0 %

## 2018-03-20 ENCOUNTER — Other Ambulatory Visit: Payer: Self-pay

## 2018-03-20 ENCOUNTER — Encounter (HOSPITAL_COMMUNITY): Payer: Self-pay

## 2018-03-20 ENCOUNTER — Inpatient Hospital Stay (HOSPITAL_COMMUNITY): Payer: PPO

## 2018-03-20 VITALS — BP 145/64 | HR 71 | Temp 97.6°F | Resp 18

## 2018-03-20 DIAGNOSIS — D509 Iron deficiency anemia, unspecified: Secondary | ICD-10-CM | POA: Diagnosis not present

## 2018-03-20 DIAGNOSIS — D500 Iron deficiency anemia secondary to blood loss (chronic): Secondary | ICD-10-CM

## 2018-03-20 MED ORDER — SODIUM CHLORIDE 0.9 % IV SOLN
750.0000 mg | Freq: Once | INTRAVENOUS | Status: AC
  Administered 2018-03-20: 750 mg via INTRAVENOUS
  Filled 2018-03-20: qty 15

## 2018-03-20 MED ORDER — SODIUM CHLORIDE 0.9 % IV SOLN
INTRAVENOUS | Status: DC
  Administered 2018-03-20: 14:00:00 via INTRAVENOUS

## 2018-03-20 NOTE — Progress Notes (Signed)
Tolerated infusion w/o adverse reaction.  Alert, in no distress.  VSS.  Discharged ambulatory in c/o spouse.  

## 2018-03-25 ENCOUNTER — Encounter: Payer: Self-pay | Admitting: Gastroenterology

## 2018-03-27 ENCOUNTER — Encounter (HOSPITAL_COMMUNITY): Payer: Self-pay

## 2018-03-27 ENCOUNTER — Inpatient Hospital Stay (HOSPITAL_COMMUNITY): Payer: PPO

## 2018-03-27 VITALS — BP 142/60 | HR 81 | Temp 97.8°F | Resp 16

## 2018-03-27 DIAGNOSIS — D500 Iron deficiency anemia secondary to blood loss (chronic): Secondary | ICD-10-CM

## 2018-03-27 DIAGNOSIS — D509 Iron deficiency anemia, unspecified: Secondary | ICD-10-CM | POA: Diagnosis not present

## 2018-03-27 MED ORDER — SODIUM CHLORIDE 0.9 % IV SOLN
750.0000 mg | Freq: Once | INTRAVENOUS | Status: AC
  Administered 2018-03-27: 750 mg via INTRAVENOUS
  Filled 2018-03-27: qty 15

## 2018-03-27 MED ORDER — SODIUM CHLORIDE 0.9 % IV SOLN
INTRAVENOUS | Status: DC
  Administered 2018-03-27: 11:00:00 via INTRAVENOUS

## 2018-03-27 NOTE — Progress Notes (Signed)
Jared Schmidt tolerated Injectafer infusion well without complaints or incident. VSS upon discharge. Pt discharged self ambulatory in satisfactory condition accompanied by his wife

## 2018-03-27 NOTE — Patient Instructions (Signed)
[facility] at [facility] Discharge Instructions  Received Injectafer infusion today. Follow-up as scheduled. Call clinic for any questions or concerns   Thank you for choosing [facility] at [facility] to provide your oncology and hematology care.  To afford each patient quality time with our provider, please arrive at least 15 minutes before your scheduled appointment time.   If you have a lab appointment with the Cancer Center please come in thru the  Main Entrance and check in at the main information desk  You need to re-schedule your appointment should you arrive 10 or more minutes late.  We strive to give you quality time with our providers, and arriving late affects you and other patients whose appointments are after yours.  Also, if you no show three or more times for appointments you may be dismissed from the clinic at the providers discretion.     Again, thank you for choosing [facility].  Our hope is that these requests will decrease the amount of time that you wait before being seen by our physicians.       _____________________________________________________________  Should you have questions after your visit to [facility], please contact our office at [phone] between the hours of 8:30 a.m. and 4:30 p.m.  Voicemails left after 4:30 p.m. will not be returned until the following business day.  For prescription refill requests, have your pharmacy contact our office.       Resources For Cancer Patients and their Caregivers ? American Cancer Society: Can assist with transportation, wigs, general needs, runs Look Good Feel Better.        1-888-227-6333 ? Cancer Care: Provides financial assistance, online support groups, medication/co-pay assistance.  1-800-813-HOPE (4673) ? [facility] Assists [place] cancer patients and their families through emotional , educational and  financial support.  336-427-4357 ? [place] DSS Where to apply for food stamps, Medicaid and utility assistance. 336-342-1394 ? [facility]: Transportation to medical appointments. 336-347-2287 ? Social Security Administration: May apply for disability if have a Stage IV cancer. 336-342-7796 1-800-772-1213 ? [place] [employer], Disability and Transit Services: Assists with nutrition, care and transit needs. 336-349-2343  Cancer Center Support Programs:   > Cancer Support Group  2nd Tuesday of the month 1pm-2pm, Journey Room   > Creative Journey  3rd Tuesday of the month 1130am-1pm, Journey Room    

## 2018-03-31 ENCOUNTER — Other Ambulatory Visit (HOSPITAL_COMMUNITY): Payer: Self-pay

## 2018-03-31 ENCOUNTER — Inpatient Hospital Stay (HOSPITAL_COMMUNITY): Payer: PPO

## 2018-03-31 DIAGNOSIS — D509 Iron deficiency anemia, unspecified: Secondary | ICD-10-CM | POA: Diagnosis not present

## 2018-03-31 DIAGNOSIS — D508 Other iron deficiency anemias: Secondary | ICD-10-CM

## 2018-03-31 LAB — CBC WITH DIFFERENTIAL/PLATELET
BASOS PCT: 0 %
Basophils Absolute: 0 10*3/uL (ref 0.0–0.1)
EOS ABS: 0.2 10*3/uL (ref 0.0–0.7)
Eosinophils Relative: 2 %
HCT: 32.1 % — ABNORMAL LOW (ref 39.0–52.0)
HEMOGLOBIN: 8.9 g/dL — AB (ref 13.0–17.0)
LYMPHS ABS: 0.7 10*3/uL (ref 0.7–4.0)
Lymphocytes Relative: 8 %
MCH: 22.6 pg — AB (ref 26.0–34.0)
MCHC: 27.7 g/dL — AB (ref 30.0–36.0)
MCV: 81.7 fL (ref 78.0–100.0)
MONO ABS: 1.5 10*3/uL — AB (ref 0.1–1.0)
Monocytes Relative: 17 %
Neutro Abs: 6.3 10*3/uL (ref 1.7–7.7)
Neutrophils Relative %: 73 %
PLATELETS: 343 10*3/uL (ref 150–400)
RBC: 3.93 MIL/uL — ABNORMAL LOW (ref 4.22–5.81)
RDW: 30.7 % — ABNORMAL HIGH (ref 11.5–15.5)
WBC: 8.7 10*3/uL (ref 4.0–10.5)

## 2018-03-31 LAB — COMPREHENSIVE METABOLIC PANEL
ALK PHOS: 85 U/L (ref 38–126)
ALT: 15 U/L — AB (ref 17–63)
ANION GAP: 7 (ref 5–15)
AST: 25 U/L (ref 15–41)
Albumin: 3.4 g/dL — ABNORMAL LOW (ref 3.5–5.0)
BUN: 17 mg/dL (ref 6–20)
CALCIUM: 8.5 mg/dL — AB (ref 8.9–10.3)
CO2: 22 mmol/L (ref 22–32)
CREATININE: 1.08 mg/dL (ref 0.61–1.24)
Chloride: 108 mmol/L (ref 101–111)
Glucose, Bld: 87 mg/dL (ref 65–99)
Potassium: 3.8 mmol/L (ref 3.5–5.1)
Sodium: 137 mmol/L (ref 135–145)
TOTAL PROTEIN: 6.6 g/dL (ref 6.5–8.1)
Total Bilirubin: 0.4 mg/dL (ref 0.3–1.2)

## 2018-03-31 LAB — LACTATE DEHYDROGENASE: LDH: 168 U/L (ref 98–192)

## 2018-03-31 LAB — FERRITIN: Ferritin: 1120 ng/mL — ABNORMAL HIGH (ref 24–336)

## 2018-04-02 ENCOUNTER — Other Ambulatory Visit (HOSPITAL_COMMUNITY): Payer: Self-pay | Admitting: Pharmacist

## 2018-04-02 ENCOUNTER — Inpatient Hospital Stay (HOSPITAL_BASED_OUTPATIENT_CLINIC_OR_DEPARTMENT_OTHER): Payer: PPO | Admitting: Internal Medicine

## 2018-04-02 ENCOUNTER — Encounter (HOSPITAL_COMMUNITY): Payer: Self-pay | Admitting: Internal Medicine

## 2018-04-02 VITALS — BP 142/61 | HR 72 | Temp 97.6°F | Resp 18 | Wt 135.5 lb

## 2018-04-02 DIAGNOSIS — D472 Monoclonal gammopathy: Secondary | ICD-10-CM

## 2018-04-02 DIAGNOSIS — D500 Iron deficiency anemia secondary to blood loss (chronic): Secondary | ICD-10-CM

## 2018-04-02 DIAGNOSIS — E538 Deficiency of other specified B group vitamins: Secondary | ICD-10-CM | POA: Diagnosis not present

## 2018-04-02 DIAGNOSIS — Z87891 Personal history of nicotine dependence: Secondary | ICD-10-CM

## 2018-04-02 DIAGNOSIS — I10 Essential (primary) hypertension: Secondary | ICD-10-CM

## 2018-04-02 DIAGNOSIS — D509 Iron deficiency anemia, unspecified: Secondary | ICD-10-CM

## 2018-04-02 NOTE — Patient Instructions (Signed)
[place] at [facility] Discharge Instructions  You were seen by Dr. Walden Field today. Follow-up as scheduled   Thank you for choosing [place] at [facility] to provide your oncology and hematology care.  To afford each patient quality time with our provider, please arrive at least 15 minutes before your scheduled appointment time.   If you have a lab appointment with the [place] please come in thru the  Main Entrance and check in at the main information desk  You need to re-schedule your appointment should you arrive 10 or more minutes late.  We strive to give you quality time with our providers, and arriving late affects you and other patients whose appointments are after yours.  Also, if you no show three or more times for appointments you may be dismissed from the clinic at the providers discretion.     Again, thank you for choosing [facility]  Our hope is that these requests will decrease the amount of time that you wait before being seen by our physicians.       _____________________________________________________________  Should you have questions after your visit to [facility], please contact our office at [phone] between the hours of 8:30 a.m. and 4:30 p.m.  Voicemails left after 4:30 p.m. will not be returned until the following business day.  For prescription refill requests, have your pharmacy contact our office.       Resources For Cancer Patients and their Caregivers ? American Cancer Society: Can assist with transportation, wigs, general needs, runs Look Good Feel Better.        505 780 5967 ? Cancer Care: Provides financial assistance, online support groups, medication/co-pay assistance.  1-800-813-HOPE 623 590 7374) ? [facility] Assists [place] cancer patients and their families through emotional , educational and financial support.  915-134-0944 ? [place] DSS Where to apply for food stamps, Medicaid and utility assistance. 515 395 1644 ? [facility]: Transportation to medical appointments. (213)360-9659 ? Social Security Administration: May apply for disability if have a Stage IV cancer. 6204811419 320-884-9814 ? [employer], Disability and Transit Services: Assists with nutrition, care and transit needs. [facility] Support Programs:   > Cancer Support Group  2nd Tuesday of the month 1pm-2pm, Journey Room   > Creative Journey  3rd Tuesday of the month 1130am-1pm, Journey Room

## 2018-04-02 NOTE — Progress Notes (Signed)
Diagnosis Iron deficiency anemia due to chronic blood loss - Plan: CBC with Differential/Platelet, Comprehensive metabolic panel, Lactate dehydrogenase, Protein electrophoresis, serum, Ferritin, Kappa/lambda light chains, IgG, IgA, IgM, Immunofixation electrophoresis  Staging Cancer Staging No matching staging information was found for the patient.  Assessment and Plan:  1.  Microcytic anemia. 82 year old male referred by Dr. Luan Pulling for evaluation of anemia.  The patient was seen in the emergency room due to severe anemia.  He reported he had been feeling tired, also dizzy.  He had been seen at [facility] rocking him ER and was transferred to [facility] for evaluation.  Labs done showed a white count of 8.6 hemoglobin 6.7 platelets 307,000.  PT INR was within normal limits.  Potassium was 3.8 bicarb was 18 creatinine was 1.18.  Labs done 01/21/2018 showed a white count 11.4 hemoglobin 8.9 platelets 284,000.  MCV was 75.  H&H done 03/03/2018 showed a hemoglobin of 6.  He was last transfused 03/03/2018.  He reports craving ice.  He denies any blood in his stool or his urine.  He had been offered GI evaluation but refused GI evaluation.  He reports he is no longer taking iron supplements.  He denies any family history of leukemia, lymphoma, hemoglobinopathy.  He was a longtime smoker reportedly last smoked in February 2019.  Patient was seen for consultation due to microcytic anemia. He reports he is feeling better since transfusion.     Anemia work-up showed normal hemoglobin electrophoresis, SPEP showed minimal m-spike of 0.2 g/dl. Pt had a normal B12, folate, haptoglobin, LDH.  Ferritin was decreased at 5.  He was treated with IV iron on 03/20/2018 and 03/27/2018.    Labs done 03/31/2018 reviewed with pt and showed WBC 8.7, hb 8.9 and plts 343,000, ferritin greater than 1000 after IV iron.  He should continue MVI.  He has been referred for GI evaluation for endoscopy and colonoscopy.  He is now willing to  have that consultation done.  He will RTC in 2 months for follow-up and repeat labs. Hb is improved after IV iron.  Will continue to monitor.    2.  Respiratory failure requiring intubation.  Patient denies any respiratory symptoms today.  He was  hospitalized in [place] and reportedly was intubated.  Pulse ox today in clinic is 100% on room air.  Continue to follow-up with Dr. Luan Pulling.  3.  Hypertension.  Blood pressure is 142/61.  Follow-up with PCP.  4.  B12 deficiency.  Pt has normal B12 levels.  He is referred to GI for evaluation due to the severity of his anemia.  5.  Smoking.  Patient reports he has discontinued smoking in February 2019.  Will consider chest imaging.  6.  Pica.  Patient reports craving ice.  This is usually a symptom of iron deficiency anemia.  Symptoms have improved after IV iron.    7.  Monoclonal gammopathy.  Anemia workup showed a small m-spike of 0.2 g/dl.  He will have repeat SPEP in 05/2018 and have additional workup with FLC, quant IG, Ifix,   8.  Request for cream that he got from [facility].  Pt was prescribed "miracle cream" from [facility] hospital.  Have discussed with pharmacy at [facility] and pt had RX called to pharmacy.    Interval History:  82 year old male referred by Dr. Luan Pulling for evaluation of anemia.  The patient was seen in the emergency room due to severe anemia.  He reported he had been feeling tired, also dizzy.  He had been seen at [facility] and was transferred to [facility] for evaluation.  Labs done showed a white count of 8.6 hemoglobin 6.7 platelets 307,000.  PT INR was within normal limits.  Potassium was 3.8 bicarb was 18 creatinine was 1.18.  Labs done 01/21/2018 showed a white count 11.4 hemoglobin 8.9 platelets 284,000.  MCV was 75.  H&H done 03/03/2018 showed a hemoglobin of 6.  He was last transfused 03/03/2018.  He reports craving ice.  He denies any blood in his stool or his urine.  He had been offered GI evaluation but refused GI  evaluation.  He reports he is no longer taking iron supplements.  He denies any family history of leukemia, lymphoma, hemoglobinopathy.  He was a longtime smoker reportedly last smoked in February 2019.  He reports he was hospitalized in [place] for shortness of breath and was intubated.  He was found to have IDA and was treated with IV iron on 03/20/2018 and 03/27/2018.    Labs done 03/16/2018 showed a white count 8.4 hemoglobin 7.7 MCV 72 platelets 388,000. Ferritin was 5, Creatinine is 1.02.   Current Status:  Pt is seen today for follow-up.  He is feeling better since IV iron.  He desires a refill on some cream he received in [place].  He reports he is scheduled to see GI.  He is here to go over labs.     Problem List Patient Active Problem List   Diagnosis Date Noted  . Iron deficiency anemia due to chronic blood loss [D50.0] 03/17/2018  . Open wound of penis [S31.20XA] 01/21/2018  . Symptomatic anemia [D64.9] 01/20/2018  . Hypertension [I10] 01/20/2018  . Hypercholesteremia [E78.00] 01/20/2018  . GERD (gastroesophageal reflux disease) [K21.9] 01/20/2018  . Vitamin B deficiency [E53.9] 01/20/2018    Past Medical History Past Medical History:  Diagnosis Date  . Anemia   . GERD (gastroesophageal reflux disease)   . Hypercholesteremia   . Hypertension   . Iron deficiency anemia due to chronic blood loss 03/17/2018  . Vitamin B deficiency     Past Surgical History Past Surgical History:  Procedure Laterality Date  . BACK SURGERY      Family History Family History  Family history unknown: Yes     Social History  reports that he quit smoking about 3 months ago. His smoking use included cigarettes. He has a 33.50 pack-year smoking history. He has never used smokeless tobacco. He reports that he does not drink alcohol or use drugs.  Medications  Current Outpatient Medications:  .  amLODipine (NORVASC) 5 MG tablet, Take 5 mg by mouth daily., Disp: , Rfl:  .  aspirin EC 81 MG tablet,  Take 81 mg by mouth daily. , Disp: , Rfl:  .  atorvastatin (LIPITOR) 10 MG tablet, Take 10 mg by mouth daily at 6 PM., Disp: , Rfl:  .  carvedilol (COREG) 12.5 MG tablet, Take 12.5 mg by mouth 2 (two) times daily with a meal., Disp: , Rfl:  .  multivitamin-iron-minerals-folic acid (CENTRUM) chewable tablet, Chew 1 tablet by mouth daily., Disp: , Rfl:  .  pantoprazole (PROTONIX) 40 MG tablet, Take 40 mg by mouth 2 (two) times daily., Disp: , Rfl:  .  thiamine 100 MG tablet, Take 100 mg by mouth daily., Disp: , Rfl:  .  vitamin C (ASCORBIC ACID) 500 MG tablet, Take 500 mg by mouth daily., Disp: , Rfl:  .  vitamin E 400 UNIT capsule, Take 800 Units  by mouth daily., Disp: , Rfl:   Allergies Patient has no active allergies.  Review of Systems Review of Systems - Oncology ROS as per HPI otherwise 12 point ROS is negative.   Physical Exam  Vitals Wt Readings from Last 3 Encounters:  04/02/18 135 lb 8 oz (61.5 kg)  03/16/18 135 lb 4.8 oz (61.4 kg)  02/25/18 134 lb (60.8 kg)   Temp Readings from Last 3 Encounters:  04/02/18 97.6 F (36.4 C) (Oral)  03/27/18 97.8 F (36.6 C) (Oral)  03/20/18 97.6 F (36.4 C) (Oral)   BP Readings from Last 3 Encounters:  04/02/18 (!) 142/61  03/27/18 (!) 142/60  03/20/18 (!) 145/64   Pulse Readings from Last 3 Encounters:  04/02/18 72  03/27/18 81  03/20/18 71   Constitutional: Well-developed, well-nourished, and in no distress.   HENT: Head: Normocephalic and atraumatic.  Mouth/Throat: No oropharyngeal exudate. Mucosa moist. Eyes: Pupils are equal, round, and reactive to light. Conjunctivae are normal. No scleral icterus.  Neck: Normal range of motion. Neck supple. No JVD present.  Cardiovascular: Normal rate, regular rhythm and normal heart sounds.  Exam reveals no gallop and no friction rub.   No murmur heard. Pulmonary/Chest: Effort normal and breath sounds normal. No respiratory distress. No wheezes.No rales.  Abdominal: Soft. Bowel  sounds are normal. No distension. There is no tenderness. There is no guarding.  Musculoskeletal: No edema or tenderness.  Lymphadenopathy: No cervical, axillary or supraclavicular adenopathy.  Neurological: Alert and oriented to person, place, and time. No cranial nerve deficit.  Skin: Skin is warm and dry. No rash noted. No erythema. No pallor.  Psychiatric: Affect and judgment normal.   Labs Appointment on 03/31/2018  Component Date Value Ref Range Status  . WBC 03/31/2018 8.7  4.0 - 10.5 K/uL Final  . RBC 03/31/2018 3.93* 4.22 - 5.81 MIL/uL Final  . Hemoglobin 03/31/2018 8.9* 13.0 - 17.0 g/dL Final  . HCT 03/31/2018 32.1* 39.0 - 52.0 % Final  . MCV 03/31/2018 81.7  78.0 - 100.0 fL Final  . MCH 03/31/2018 22.6* 26.0 - 34.0 pg Final  . MCHC 03/31/2018 27.7* 30.0 - 36.0 g/dL Final  . RDW 03/31/2018 30.7* 11.5 - 15.5 % Final  . Platelets 03/31/2018 343  150 - 400 K/uL Final   Comment: PLATELET COUNT CONFIRMED BY SMEAR GIANT PLATELETS SEEN   . Neutrophils Relative % 03/31/2018 73  % Final  . Neutro Abs 03/31/2018 6.3  1.7 - 7.7 K/uL Final  . Lymphocytes Relative 03/31/2018 8  % Final  . Lymphs Abs 03/31/2018 0.7  0.7 - 4.0 K/uL Final  . Monocytes Relative 03/31/2018 17  % Final  . Monocytes Absolute 03/31/2018 1.5* 0.1 - 1.0 K/uL Final  . Eosinophils Relative 03/31/2018 2  % Final  . Eosinophils Absolute 03/31/2018 0.2  0.0 - 0.7 K/uL Final  . Basophils Relative 03/31/2018 0  % Final  . Basophils Absolute 03/31/2018 0.0  0.0 - 0.1 K/uL Final  . RBC Morphology 03/31/2018 Schistocytes present   Final   Comment: ANISOCYTES BURR CELLS POLYCHROMASIA PRESENT Performed at [facility], [address] [place], [place] [address]   . Sodium 03/31/2018 137  135 - 145 mmol/L Final  . Potassium 03/31/2018 3.8  3.5 - 5.1 mmol/L Final  . Chloride 03/31/2018 108  101 - 111 mmol/L Final  . CO2 03/31/2018 22  22 - 32 mmol/L Final  . Glucose, Bld 03/31/2018 87  65 - 99 mg/dL Final  . BUN  03/31/2018 17  6 - 20 mg/dL Final  . Creatinine, Ser 03/31/2018 1.08  0.61 - 1.24 mg/dL Final  . Calcium 03/31/2018 8.5* 8.9 - 10.3 mg/dL Final  . Total Protein 03/31/2018 6.6  6.5 - 8.1 g/dL Final  . Albumin 03/31/2018 3.4* 3.5 - 5.0 g/dL Final  . AST 03/31/2018 25  15 - 41 U/L Final  . ALT 03/31/2018 15* 17 - 63 U/L Final  . Alkaline Phosphatase 03/31/2018 85  38 - 126 U/L Final  . Total Bilirubin 03/31/2018 0.4  0.3 - 1.2 mg/dL Final  . GFR calc non Af Amer 03/31/2018 >60  >60 mL/min Final  . GFR calc Af Amer 03/31/2018 >60  >60 mL/min Final   Comment: (NOTE) The eGFR has been calculated using the CKD EPI equation. This calculation has not been validated in all clinical situations. eGFR's persistently <60 mL/min signify possible Chronic Kidney Disease.   Georgiann Hahn gap 03/31/2018 7  5 - 15 Final   Performed at [facility], [address] [place], [place] [address]  . LDH 03/31/2018 168  98 - 192 U/L Final   Performed at [facility], [address], [place], [place] [address]  . Ferritin 03/31/2018 1,120* 24 - 336 ng/mL Final   Performed at [facility], [place] [address], [place], [place] [address]     Pathology Orders Placed This Encounter  Procedures  . CBC with Differential/Platelet    Standing Status:   Future    Standing Expiration Date:   04/03/2019  . Comprehensive metabolic panel    Standing Status:   Future    Standing Expiration Date:   04/03/2019  . Lactate dehydrogenase    Standing Status:   Future    Standing Expiration Date:   04/03/2019  . Protein electrophoresis, serum    Standing Status:   Future    Standing Expiration Date:   04/03/2019  . Ferritin    Standing Status:   Future    Standing Expiration Date:   04/03/2019  . Kappa/lambda light chains    Standing Status:   Future    Standing Expiration Date:   04/03/2019  . IgG, IgA, IgM    Standing Status:   Future    Standing Expiration Date:   04/03/2019  . Immunofixation electrophoresis    Standing  Status:   Future    Standing Expiration Date:   04/03/2019       Zoila Shutter MD

## 2018-04-20 DIAGNOSIS — I10 Essential (primary) hypertension: Secondary | ICD-10-CM | POA: Diagnosis not present

## 2018-04-20 DIAGNOSIS — I2510 Atherosclerotic heart disease of native coronary artery without angina pectoris: Secondary | ICD-10-CM | POA: Diagnosis not present

## 2018-04-20 DIAGNOSIS — D509 Iron deficiency anemia, unspecified: Secondary | ICD-10-CM | POA: Diagnosis not present

## 2018-04-20 DIAGNOSIS — J449 Chronic obstructive pulmonary disease, unspecified: Secondary | ICD-10-CM | POA: Diagnosis not present

## 2018-05-05 ENCOUNTER — Ambulatory Visit (INDEPENDENT_AMBULATORY_CARE_PROVIDER_SITE_OTHER): Payer: PPO | Admitting: Gastroenterology

## 2018-05-05 ENCOUNTER — Encounter: Payer: Self-pay | Admitting: Gastroenterology

## 2018-05-05 VITALS — BP 129/84 | HR 95 | Temp 97.0°F | Ht 67.0 in | Wt 140.0 lb

## 2018-05-05 DIAGNOSIS — D500 Iron deficiency anemia secondary to blood loss (chronic): Secondary | ICD-10-CM | POA: Diagnosis not present

## 2018-05-05 NOTE — Patient Instructions (Addendum)
1. We will obtain copy of your upper endoscopy report from [facility] for review.  2. As discussed today, we are not sure why you are iron deficient and we cannot rule out possibility of underlying cancer without work up. However, per your request we will hold off on colonoscopy until after your labs later this month.  3. Once you have your labs done later this month, please contact our office. 4. Please complete stool test to check for blood in stool.

## 2018-05-05 NOTE — Progress Notes (Signed)
cc'ed to pcp °

## 2018-05-05 NOTE — Assessment & Plan Note (Addendum)
82 year old gentleman presenting at the request of hematology for further evaluation of unexplained iron deficiency anemia, presumed occult blood loss.  There has been no documented Hemoccults locally to my knowledge.  It is unclear if there was documented occult blood loss while in [facility].  He denies overt GI bleeding.  No prior colonoscopy.  His wife states he has had an upper endoscopy back in February while hospitalized for sepsis and was told it was unremarkable.  We have requested those records.  Extensive discussion with patient and wife today regarding typical work-up of iron deficiency anemia including colonoscopy followed by upper endoscopy if unremarkable.  Since he has already had EGD, if we are able to retrieve those records we would not necessarily need to repeat that.  If nothing found on colonoscopy would offer him a small bowel capsule endoscopy as well.  Discussed that he could have potential source for chronic occult GI bleeding from anything benign etiologies to malignancy.   At this time he is not prepared to schedule any procedures.  He asked to wait until after his next labs later this month and would make a decision at that point.  He fully understands that delaying work-up could be detrimental to his health.  In the meantime we will try to get a copy of his EGD report for review.  He will notify us after he has his labs done so that we can review as well.  Collect ifobt.

## 2018-05-05 NOTE — Progress Notes (Signed)
Primary Care Physician:  Sinda Du, MD Referring MD: Zoila Shutter, MD Primary Gastroenterologist:  Garfield Cornea, MD   Chief Complaint  Patient presents with  . Anemia    HPI:  Jared Schmidt is a 82 y.o. male here at the request of Dr. Walden Field for further evaluation of IDA.   Patient presents with his wife and both provide following history.   In 12/2017 started feeling sick. N/V/D. EMS activated on 12/11/17 took to [facility]. Ended up being transferred to [facility]. Dx with sepsis. Hospitalized for 12/11/17 to 12/26/17. Hgb and iron extremely low at that time. Had to have hemodialysis. Believes he had blood transfusions at that time. Two small strokes during hospitalziation. EGD performed and nothing found per wife.  Weight had gotten down to 120lb. Homehealth and home PT for awhile.   He was seen in ED at [facility] 01/20/18 for low Hgb and admitted for transfusion. Patient refused GI work up during that hospitalization. He presented with hgb 6.7, MCV 72.8, WBC 8800, Platelets 307,000. After 3 units of prbcs his Hgb 8.9.   Dr. Luan Pulling referred to hematology because hemoglobin continued to drop without obvious signs of bleeding. His Hgb was 6 on 03/03/18.  He received two more units of prbcs 03/04/18. Hgb 7.7 on 03/16/18. Ferritin 5 on 03/16/18. LDH and haptoglobin normal in 03/2018. He received two iron infusions, May 17 and Mar 27, 2018.  Wife states that the patient really perked up after iron infusions. No additional weakness. Weight back at baseline at 140lb. Upcoming bloodwork 05/26/18.  Appointment with hematology on 06/02/18.  Occasional cramping in stomach since started medication this year.  Otherwise denies constipation, diarrhea, melena, rectal bleeding, heartburn, dysphagia, vomiting.  He has gained back to his baseline of 140 pounds after dropping down to 120 pounds with his illness.  No prior colonoscopy.  No family history of colon cancer.  Results for CLEVLAND, CORK (MRN [id number]) as of  05/05/2018 08:07  Ref. Range 01/20/2018 19:28 01/21/2018 09:04 03/03/2018 14:39 03/16/2018 10:13 03/31/2018 11:57  WBC Latest Ref Range: 4.0 - 10.5 K/uL 8.8 11.4 (H)  8.4 8.7  RBC Latest Ref Range: 4.22 - 5.81 MIL/uL 3.46 (L) 4.09 (L)  3.85 (L) 3.93 (L)  Hemoglobin Latest Ref Range: 13.0 - 17.0 g/dL 6.7 (LL) 8.9 (L) 6.0 (LL) 7.7 (L) 8.9 (L)  HCT Latest Ref Range: 39.0 - 52.0 % 25.2 (L) 31.0 (L) 22.1 (L) 27.6 (L) 32.1 (L)  MCV Latest Ref Range: 78.0 - 100.0 fL 72.8 (L) 75.8 (L)  71.7 (L) 81.7  MCH Latest Ref Range: 26.0 - 34.0 pg 19.4 (L) 21.8 (L)  20.0 (L) 22.6 (L)  MCHC Latest Ref Range: 30.0 - 36.0 g/dL 26.6 (L) 28.7 (L)  27.9 (L) 27.7 (L)  RDW Latest Ref Range: 11.5 - 15.5 % 25.8 (H) 23.1 (H)  26.3 (H) 30.7 (H)  Platelets Latest Ref Range: 150 - 400 K/uL 307 284  378 343  Results for LOTHAR, PREHN (MRN [id number]) as of 05/05/2018 08:07  Ref. Range 03/16/2018 10:13 03/31/2018 11:57  Ferritin Latest Ref Range: 24 - 336 ng/mL 5 (L) 1,120 (H)    Current Outpatient Medications  Medication Sig Dispense Refill  . amLODipine (NORVASC) 5 MG tablet Take 5 mg by mouth daily.    Marland Kitchen aspirin EC 81 MG tablet Take 81 mg by mouth daily.     Marland Kitchen atorvastatin (LIPITOR) 10 MG tablet Take 10 mg by mouth daily at 6 PM.    . carvedilol (  COREG) 12.5 MG tablet Take 12.5 mg by mouth 2 (two) times daily with a meal.    . multivitamin-iron-minerals-folic acid (CENTRUM) chewable tablet Chew 1 tablet by mouth daily.    . pantoprazole (PROTONIX) 40 MG tablet Take 40 mg by mouth 2 (two) times daily.    . vitamin C (ASCORBIC ACID) 500 MG tablet Take 500 mg by mouth daily.    . vitamin E 400 UNIT capsule Take 800 Units by mouth daily.     No current facility-administered medications for this visit.     Allergies as of 05/05/2018  . (No Known Allergies)    Past Medical History:  Diagnosis Date  . Anemia   . GERD (gastroesophageal reflux disease)   . Hypercholesteremia   . Hypertension   . Iron deficiency anemia due to  chronic blood loss 03/17/2018  . Vitamin B deficiency    patient denies    Past Surgical History:  Procedure Laterality Date  . none      Family History  Problem Relation Age of Onset  . Colon cancer Neg Hx     Social History   Socioeconomic History  . Marital status: Married    Spouse name: Jamarii Banks  . Number of children: Not on file  . Years of education: Not on file  . Highest education level: 8th grade  Occupational History  . Not on file  Social Needs  . Financial resource strain: Not on file  . Food insecurity:    Worry: Not on file    Inability: Not on file  . Transportation needs:    Medical: Not on file    Non-medical: Not on file  Tobacco Use  . Smoking status: Former Smoker    Packs/day: 0.50    Years: 67.00    Pack years: 33.50    Types: Cigarettes    Last attempt to quit: 12/11/2017    Years since quitting: 0.3  . Smokeless tobacco: Never Used  Substance and Sexual Activity  . Alcohol use: No    Frequency: Never  . Drug use: No  . Sexual activity: Not Currently  Lifestyle  . Physical activity:    Days per week: Not on file    Minutes per session: Not on file  . Stress: Not on file  Relationships  . Social connections:    Talks on phone: Not on file    Gets together: Not on file    Attends religious service: Not on file    Active member of club or organization: Not on file    Attends meetings of clubs or organizations: Not on file    Relationship status: Not on file  . Intimate partner violence:    Fear of current or ex partner: Not on file    Emotionally abused: Not on file    Physically abused: Not on file    Forced sexual activity: Not on file  Other Topics Concern  . Not on file  Social History Narrative  . Not on file      ROS:  General: Negative for anorexia, weight loss, fever, chills, fatigue, weakness.  See HPI Eyes: Negative for vision changes.  ENT: Negative for hoarseness, difficulty swallowing , nasal  congestion. CV: Negative for chest pain, angina, palpitations, dyspnea on exertion, peripheral edema.  Respiratory: Negative for dyspnea at rest, dyspnea on exertion, cough, sputum, wheezing.  GI: See history of present illness. GU:  Negative for dysuria, hematuria, urinary incontinence, urinary frequency, nocturnal urination.  MS:  Negative for joint pain, low back pain.  Derm: Negative for rash or itching.  Neuro: Negative for weakness, abnormal sensation, seizure, frequent headaches, memory loss, confusion.  Psych: Negative for anxiety, depression, suicidal ideation, hallucinations.  Endo: Negative for unusual weight change.  Heme: Negative for bruising or bleeding. Allergy: Negative for rash or hives.    Physical Examination:  BP 129/84   Pulse 95   Temp (!) 97 F (36.1 C) (Oral)   Ht 5\' 7"  (1.702 m)   Wt 140 lb (63.5 kg)   BMI 21.93 kg/m    General: Well-nourished, well-developed in no acute distress.  Head: Normocephalic, atraumatic.   Eyes: Conjunctiva pink, no icterus. Mouth: Oropharyngeal mucosa moist and pink , no lesions erythema or exudate. Neck: Supple without thyromegaly, masses, or lymphadenopathy.  Lungs: Clear to auscultation bilaterally.  Heart: Regular rate and rhythm, no murmurs rubs or gallops.  Abdomen: Bowel sounds are normal, nontender, nondistended, no hepatosplenomegaly or masses, no abdominal bruits or    hernia , no rebound or guarding.   Rectal: Not performed Extremities: No lower extremity edema. No clubbing or deformities.  Neuro: Alert and oriented x 4 , grossly normal neurologically.  Skin: Warm and dry, no rash or jaundice.   Psych: Alert and cooperative, normal mood and affect.  Labs: Lab Results  Component Value Date   CREATININE 1.08 03/31/2018   BUN 17 03/31/2018   NA 137 03/31/2018   K 3.8 03/31/2018   CL 108 03/31/2018   CO2 22 03/31/2018   Lab Results  Component Value Date   WBC 8.7 03/31/2018   HGB 8.9 (L) 03/31/2018   HCT  32.1 (L) 03/31/2018   MCV 81.7 03/31/2018   PLT 343 03/31/2018   Lab Results  Component Value Date   FERRITIN 1,120 (H) 03/31/2018   Lab Results  Component Value Date   VITAMINB12 848 03/16/2018   Lab Results  Component Value Date   FOLATE 57.9 03/16/2018     Imaging Studies: No results found.

## 2018-05-08 ENCOUNTER — Telehealth: Payer: Self-pay | Admitting: Gastroenterology

## 2018-05-08 NOTE — Telephone Encounter (Signed)
Reviewed records received from [facility] hospitalization in 12/2017.  Patient transferred from [facility] 12/11/17 due to severe hyperkalemia (K 9.9) and anion gap acidosis. Reported diarrhea and rectal bleeding vs hematuria at home prior to presentation. At [facility] presented with Hgb 4.6. WBC 28.7. He suffered multifactorial shock with multiorgan dysfunction (shock liver, Type II MI, ARF, mesenteric ischemia). Hospital course also complicated by bilateral cerebral infarcts.   He was evaluated by GI and offered TCS, patient declined.  He had TEE (not EGD) without clear source for embolic CVA.   CT also with layering high attenuating material within stomach lumen, EGD recommended. Segmental wall thickening of ascending colon at hepatic flexure could represent focal colitis vs neoplasm. Nonspecific mild wall thickening of SB within right lower quadrant c/w enteritis ?infectious vs ischemic.   PLEASE LET PATIENT KNOW I REVIEWED RECORDS FROM [place].   ACCORDING TO RECORDS RECEIVED, HE DID NOT HAVE EGD, HE HAD TEE (ULTRASOUND OF HEART FROM INSIDE THE ESOPHAGUS BUT HIS UPPER GI TRACT WAS NOT EVALUATED FOR ANEMIA).  AT THIS TIME APPROPRIATE WORK UP WOULD BE TCS/EGD (HE ALSO HAD ABNORMAL STOMACH AND COLON ON CT).  AT TIME OF OV, HE WANTED TO WAIT FOR LABS BEFORE DECIDING.   PLEASE LET HIM KNOW ABOVE INFO IE TCS/EGD RECOMMENDED FOR ANEMIA AND ABNORMAL FINDINGS ON CT.

## 2018-05-11 NOTE — Telephone Encounter (Signed)
Spoke with spouse, she said as stated before they will wait on the lab work before deciding on the TCS/EGD. Spouse thanked me for calling and stated they would wait.

## 2018-05-11 NOTE — Telephone Encounter (Signed)
Tried calling pt. Pt doesn't have a VM. Will call back.

## 2018-05-12 ENCOUNTER — Telehealth: Payer: Self-pay

## 2018-05-12 NOTE — Telephone Encounter (Signed)
LSL, pts spouse returned pt IFOBT today 05/12/18. The bottle was very clear. Test was negative. I called pts spouseto confirm if pt completed the test. Pt did complete it but spouse wiped the stick off before placing it in the IFOBT. I told her that leaving some of the bowel movement on it is what is needed to complete the test, you just don't need to place lots of the bowel movement in the container because we wouldn't be able to complete the test. I offered to leave another kit for them to complete the test again. Pts spouse asked me to send a message to the provider and said " well it will be up to her to decide if another IFOBT test is needed".

## 2018-05-12 NOTE — Telephone Encounter (Signed)
Recommend repeating ifobt and complete as per instructions provided.

## 2018-05-13 NOTE — Telephone Encounter (Signed)
Noted. pts spouse notified and will pick up new ifobt kit.

## 2018-05-19 ENCOUNTER — Ambulatory Visit (INDEPENDENT_AMBULATORY_CARE_PROVIDER_SITE_OTHER): Payer: PPO | Admitting: Gastroenterology

## 2018-05-19 DIAGNOSIS — D500 Iron deficiency anemia secondary to blood loss (chronic): Secondary | ICD-10-CM | POA: Diagnosis not present

## 2018-05-20 LAB — IFOBT (OCCULT BLOOD): IMMUNOLOGICAL FECAL OCCULT BLOOD TEST: NEGATIVE

## 2018-05-26 ENCOUNTER — Telehealth (HOSPITAL_COMMUNITY): Payer: Self-pay

## 2018-05-26 ENCOUNTER — Inpatient Hospital Stay (HOSPITAL_COMMUNITY): Payer: PPO | Attending: Hematology

## 2018-05-26 ENCOUNTER — Telehealth: Payer: Self-pay

## 2018-05-26 ENCOUNTER — Other Ambulatory Visit (HOSPITAL_COMMUNITY): Payer: Self-pay | Admitting: *Deleted

## 2018-05-26 DIAGNOSIS — D472 Monoclonal gammopathy: Secondary | ICD-10-CM | POA: Diagnosis not present

## 2018-05-26 DIAGNOSIS — E538 Deficiency of other specified B group vitamins: Secondary | ICD-10-CM | POA: Insufficient documentation

## 2018-05-26 DIAGNOSIS — D500 Iron deficiency anemia secondary to blood loss (chronic): Secondary | ICD-10-CM

## 2018-05-26 DIAGNOSIS — I10 Essential (primary) hypertension: Secondary | ICD-10-CM | POA: Diagnosis not present

## 2018-05-26 DIAGNOSIS — D509 Iron deficiency anemia, unspecified: Secondary | ICD-10-CM | POA: Diagnosis not present

## 2018-05-26 LAB — CBC WITH DIFFERENTIAL/PLATELET
BASOS ABS: 0.1 10*3/uL (ref 0.0–0.1)
Basophils Relative: 1 %
EOS PCT: 1 %
Eosinophils Absolute: 0.1 10*3/uL (ref 0.0–0.7)
HEMATOCRIT: 21 % — AB (ref 39.0–52.0)
HEMOGLOBIN: 5.5 g/dL — AB (ref 13.0–17.0)
LYMPHS PCT: 14 %
Lymphs Abs: 0.9 10*3/uL (ref 0.7–4.0)
MCH: 17.6 pg — ABNORMAL LOW (ref 26.0–34.0)
MCHC: 26.2 g/dL — ABNORMAL LOW (ref 30.0–36.0)
MCV: 67.1 fL — ABNORMAL LOW (ref 78.0–100.0)
MONOS PCT: 13 %
Monocytes Absolute: 0.9 10*3/uL (ref 0.1–1.0)
NEUTROS PCT: 71 %
Neutro Abs: 4.6 10*3/uL (ref 1.7–7.7)
Platelets: 341 10*3/uL (ref 150–400)
RBC: 3.13 MIL/uL — AB (ref 4.22–5.81)
RDW: 23.7 % — ABNORMAL HIGH (ref 11.5–15.5)
WBC: 6.6 10*3/uL (ref 4.0–10.5)

## 2018-05-26 LAB — COMPREHENSIVE METABOLIC PANEL
ALBUMIN: 3.6 g/dL (ref 3.5–5.0)
ALK PHOS: 82 U/L (ref 38–126)
ALT: 11 U/L (ref 0–44)
AST: 19 U/L (ref 15–41)
Anion gap: 7 (ref 5–15)
BILIRUBIN TOTAL: 0.4 mg/dL (ref 0.3–1.2)
BUN: 18 mg/dL (ref 8–23)
CALCIUM: 9 mg/dL (ref 8.9–10.3)
CHLORIDE: 109 mmol/L (ref 98–111)
CO2: 22 mmol/L (ref 22–32)
Creatinine, Ser: 1.2 mg/dL (ref 0.61–1.24)
GFR calc Af Amer: >60 mL/min (ref >60)
GFR calc non Af Amer: 55 mL/min — ABNORMAL LOW (ref >60)
Glucose, Bld: 102 mg/dL — ABNORMAL HIGH (ref 70–99)
Potassium: 4 mmol/L (ref 3.5–5.1)
SODIUM: 138 mmol/L (ref 135–145)
Total Protein: 6.4 g/dL — ABNORMAL LOW (ref 6.5–8.1)

## 2018-05-26 LAB — FERRITIN: Ferritin: 6 ng/mL — ABNORMAL LOW (ref 24–336)

## 2018-05-26 LAB — LACTATE DEHYDROGENASE: LDH: 127 U/L (ref 98–192)

## 2018-05-26 NOTE — Telephone Encounter (Signed)
ifobt negative.   Hgb 5.5 down from 8.9. MCV 67.1, ferritin down from 1120 to 6.   Other labs pending from hematologist.   Would offer him TCS/EGD, let me know what they decide and I will give appropriate orders. (see telephone note from 05/08/18 for complete details of previous recommendations).

## 2018-05-26 NOTE — Telephone Encounter (Signed)
Received critical lab value from lab on patient. Reviewed with Dr. Walden Field. She said to call patient and let him know he was anemic again and to offer him the choice of iron infusion or blood transfusion. Attempted to call all numbers in patients chart and am unable to reach patient or leave a message. A few minutes later patients wife called back and I spoke with patient. He states he wants a blood transfusion. Notified Dr. Walden Field who put in orders for patient and message sent to scheduling also.

## 2018-05-26 NOTE — Telephone Encounter (Signed)
Spouse called to inquire about IFOBT results. Pt returned IFOBT last Tuesday 05/19/18. Pt had blood work today and his hemoglobin was low per spouse.

## 2018-05-26 NOTE — Progress Notes (Signed)
CRITICAL VALUE ALERT Critical value received:  H/H- 5.5/21.0 Date of notification:  05/26/18 Time of notification: 7366 Critical value read back:  Yes.   Nurse who received alert:  M.Wilberto Console, LPN MD notified (1st page):  V.Higgs, MD

## 2018-05-27 ENCOUNTER — Encounter (HOSPITAL_COMMUNITY): Payer: Self-pay

## 2018-05-27 ENCOUNTER — Inpatient Hospital Stay (HOSPITAL_COMMUNITY): Payer: PPO

## 2018-05-27 VITALS — BP 127/49 | HR 63 | Temp 98.1°F | Resp 18

## 2018-05-27 DIAGNOSIS — D500 Iron deficiency anemia secondary to blood loss (chronic): Secondary | ICD-10-CM

## 2018-05-27 DIAGNOSIS — D472 Monoclonal gammopathy: Secondary | ICD-10-CM | POA: Diagnosis not present

## 2018-05-27 LAB — PROTEIN ELECTROPHORESIS, SERUM
A/G Ratio: 1.6 (ref 0.7–1.7)
ALPHA-1-GLOBULIN: 0.3 g/dL (ref 0.0–0.4)
ALPHA-2-GLOBULIN: 0.6 g/dL (ref 0.4–1.0)
Albumin ELP: 3.7 g/dL (ref 2.9–4.4)
Beta Globulin: 0.8 g/dL (ref 0.7–1.3)
GLOBULIN, TOTAL: 2.3 g/dL (ref 2.2–3.9)
Gamma Globulin: 0.6 g/dL (ref 0.4–1.8)
M-SPIKE, %: 0.2 g/dL — AB
Total Protein ELP: 6 g/dL (ref 6.0–8.5)

## 2018-05-27 LAB — KAPPA/LAMBDA LIGHT CHAINS
KAPPA FREE LGHT CHN: 24 mg/L — AB (ref 3.3–19.4)
KAPPA, LAMDA LIGHT CHAIN RATIO: 1.43 (ref 0.26–1.65)
LAMDA FREE LIGHT CHAINS: 16.8 mg/L (ref 5.7–26.3)

## 2018-05-27 LAB — IGG, IGA, IGM
IGG (IMMUNOGLOBIN G), SERUM: 752 mg/dL (ref 700–1600)
IgA: 96 mg/dL (ref 61–437)
IgM (Immunoglobulin M), Srm: <5 mg/dL — ABNORMAL LOW (ref 15–143)

## 2018-05-27 MED ORDER — SODIUM CHLORIDE 0.9% IV SOLUTION
250.0000 mL | Freq: Once | INTRAVENOUS | Status: AC
  Administered 2018-05-27: 250 mL via INTRAVENOUS
  Filled 2018-05-27: qty 250

## 2018-05-27 MED ORDER — SODIUM CHLORIDE 0.9% FLUSH: 10.0000 mL | INTRAVENOUS | Status: DC | PRN

## 2018-05-27 MED ORDER — ACETAMINOPHEN 325 MG PO TABS
650.0000 mg | ORAL_TABLET | Freq: Once | ORAL | Status: AC
  Administered 2018-05-27: 650 mg via ORAL
  Filled 2018-05-27: qty 2

## 2018-05-27 MED ORDER — DIPHENHYDRAMINE HCL 25 MG PO CAPS
25.0000 mg | ORAL_CAPSULE | Freq: Once | ORAL | Status: AC
  Administered 2018-05-27: 25 mg via ORAL
  Filled 2018-05-27: qty 1

## 2018-05-27 NOTE — Telephone Encounter (Signed)
Tried calling pt, no answer. No VM left. Will try again.

## 2018-05-27 NOTE — Progress Notes (Signed)
Jared Schmidt tolerated blood transfusion well without complaints or incident. VSS throughout both transfusions and upon discharge.Peripheral IV site with positive blood return prior to and after both transfusions. Pt discharged self ambulatory in satisfactory condition accompanied by his wife

## 2018-05-27 NOTE — Patient Instructions (Signed)
[place] at [facility] Discharge Instructions  Received 2 units of blood today. Follow-up as scheduled. Call clinic for any questions or concerns   Thank you for choosing [place] at [facility] to provide your oncology and hematology care.  To afford each patient quality time with our provider, please arrive at least 15 minutes before your scheduled appointment time.   If you have a lab appointment with the [facility] please come in thru the  Main Entrance and check in at the main information desk  You need to re-schedule your appointment should you arrive 10 or more minutes late.  We strive to give you quality time with our providers, and arriving late affects you and other patients whose appointments are after yours.  Also, if you no show three or more times for appointments you may be dismissed from the clinic at the providers discretion.     Again, thank you for choosing [facility].  Our hope is that these requests will decrease the amount of time that you wait before being seen by our physicians.       _____________________________________________________________  Should you have questions after your visit to [facility], please contact our office at [phone] between the hours of 8:30 a.m. and 4:30 p.m.  Voicemails left after 4:30 p.m. will not be returned until the following business day.  For prescription refill requests, have your pharmacy contact our office.       Resources For Cancer Patients and their Caregivers ? American Cancer Society: Can assist with transportation, wigs, general needs, runs Look Good Feel Better.        629-786-1692 ? Cancer Care: Provides financial assistance, online support groups, medication/co-pay assistance.  1-800-813-HOPE 360-614-9892) ? [place] Assists [place] cancer patients and their families through emotional , educational and  financial support.  717-544-1395 ? [place] DSS Where to apply for food stamps, Medicaid and utility assistance. 906-016-6956 ? [facility]: Transportation to medical appointments. 812-208-2668 ? Social Security Administration: May apply for disability if have a Stage IV cancer. 907 367 8409 838-606-8076 ? [employer], Disability and Transit Services: Assists with nutrition, care and transit needs. [place] Support Programs:   > Cancer Support Group  2nd Tuesday of the month 1pm-2pm, Journey Room   > Creative Journey  3rd Tuesday of the month 1130am-1pm, Journey Room

## 2018-05-28 LAB — BPAM RBC
Blood Product Expiration Date: 201908052359
Blood Product Expiration Date: 201908112359
ISSUE DATE / TIME: 201907241149
ISSUE DATE / TIME: 201907241351
Unit Type and Rh: 600
Unit Type and Rh: 6200

## 2018-05-28 LAB — TYPE AND SCREEN
ABO/RH(D): A POS
Antibody Screen: NEGATIVE
Unit division: 0
Unit division: 0

## 2018-05-28 NOTE — Telephone Encounter (Signed)
Spoke with spouse and pt. Pt was notified of results. Pt wants to call back to schedule procedure when he's ready. Pt didn't want [facility] clinical to call him with an appointment.   Pt did go to the hospital yesterday and got treated for his low hemoglobin.

## 2018-05-29 LAB — IMMUNOFIXATION ELECTROPHORESIS
IGA: 92 mg/dL (ref 61–437)
IGG (IMMUNOGLOBIN G), SERUM: 748 mg/dL (ref 700–1600)
IgM (Immunoglobulin M), Srm: <5 mg/dL — ABNORMAL LOW (ref 15–143)
Total Protein ELP: 5.9 g/dL — ABNORMAL LOW (ref 6.0–8.5)

## 2018-06-01 ENCOUNTER — Telehealth: Payer: Self-pay | Admitting: Internal Medicine

## 2018-06-01 NOTE — Telephone Encounter (Signed)
[phone] PATIENT CALLED TO SCHEDULE PROCEDURES    ENDO AND TCS

## 2018-06-01 NOTE — Telephone Encounter (Signed)
Routing to LSL for advice.

## 2018-06-01 NOTE — Telephone Encounter (Signed)
Schedule EGD/TCS with RMR need ASAP given recurrent transfusion dependent anemia.  Dx: abnormal stomach on CT, Abnormal colon on CT, IDA, transfusion dependent anemia.   PLEASE LET ME KNOW WHAT DATE IS PICKED.

## 2018-06-02 ENCOUNTER — Inpatient Hospital Stay (HOSPITAL_BASED_OUTPATIENT_CLINIC_OR_DEPARTMENT_OTHER): Payer: PPO | Admitting: Internal Medicine

## 2018-06-02 ENCOUNTER — Other Ambulatory Visit: Payer: Self-pay

## 2018-06-02 ENCOUNTER — Encounter (HOSPITAL_COMMUNITY): Payer: Self-pay | Admitting: Internal Medicine

## 2018-06-02 ENCOUNTER — Inpatient Hospital Stay (HOSPITAL_COMMUNITY): Payer: PPO

## 2018-06-02 VITALS — BP 121/57 | HR 87 | Temp 97.9°F | Resp 18 | Wt 141.0 lb

## 2018-06-02 DIAGNOSIS — D500 Iron deficiency anemia secondary to blood loss (chronic): Secondary | ICD-10-CM

## 2018-06-02 DIAGNOSIS — D509 Iron deficiency anemia, unspecified: Secondary | ICD-10-CM

## 2018-06-02 DIAGNOSIS — E538 Deficiency of other specified B group vitamins: Secondary | ICD-10-CM

## 2018-06-02 DIAGNOSIS — D649 Anemia, unspecified: Secondary | ICD-10-CM

## 2018-06-02 DIAGNOSIS — I10 Essential (primary) hypertension: Secondary | ICD-10-CM | POA: Diagnosis not present

## 2018-06-02 DIAGNOSIS — D472 Monoclonal gammopathy: Secondary | ICD-10-CM

## 2018-06-02 DIAGNOSIS — R933 Abnormal findings on diagnostic imaging of other parts of digestive tract: Secondary | ICD-10-CM

## 2018-06-02 LAB — CBC WITH DIFFERENTIAL/PLATELET
BASOS ABS: 0.1 10*3/uL (ref 0.0–0.1)
BASOS PCT: 1 %
EOS ABS: 0.2 10*3/uL (ref 0.0–0.7)
Eosinophils Relative: 3 %
HCT: 25.9 % — ABNORMAL LOW (ref 39.0–52.0)
Hemoglobin: 7.2 g/dL — ABNORMAL LOW (ref 13.0–17.0)
Lymphocytes Relative: 15 %
Lymphs Abs: 1 10*3/uL (ref 0.7–4.0)
MCH: 20.1 pg — AB (ref 26.0–34.0)
MCHC: 27.8 g/dL — AB (ref 30.0–36.0)
MCV: 72.1 fL — ABNORMAL LOW (ref 78.0–100.0)
MONO ABS: 1.2 10*3/uL — AB (ref 0.1–1.0)
Monocytes Relative: 18 %
NEUTROS ABS: 4.4 10*3/uL (ref 1.7–7.7)
Neutrophils Relative %: 63 %
PLATELETS: 288 10*3/uL (ref 150–400)
RBC: 3.59 MIL/uL — ABNORMAL LOW (ref 4.22–5.81)
RDW: 28.2 % — AB (ref 11.5–15.5)
WBC: 6.9 10*3/uL (ref 4.0–10.5)

## 2018-06-02 MED ORDER — PEG 3350-KCL-NA BICARB-NACL 420 G PO SOLR: 4000.0000 mL | ORAL | 0 refills | Status: DC

## 2018-06-02 NOTE — Addendum Note (Signed)
Addended by: Hassan Rowan on: 06/02/2018 09:55 AM   Modules accepted: Orders

## 2018-06-02 NOTE — Progress Notes (Signed)
Diagnosis Iron deficiency anemia due to chronic blood loss - Plan: CBC with Differential/Platelet, CBC with Differential/Platelet, Comprehensive metabolic panel, Lactate dehydrogenase, Ferritin, CBC with Differential/Platelet, Comprehensive metabolic panel, Lactate dehydrogenase, Ferritin, Protein electrophoresis, serum, IgG, IgA, IgM, Beta 2 microglobulin, serum, Kappa/lambda light chains  MGUS (monoclonal gammopathy of unknown significance) - Plan: CBC with Differential/Platelet, CBC with Differential/Platelet, Comprehensive metabolic panel, Lactate dehydrogenase, Ferritin, CBC with Differential/Platelet, Comprehensive metabolic panel, Lactate dehydrogenase, Ferritin, Protein electrophoresis, serum, IgG, IgA, IgM, Beta 2 microglobulin, serum, Kappa/lambda light chains  Staging Cancer Staging No matching staging information was found for the patient.  Assessment and Plan:  1.  Iron deficiency anemia.  Pt is here today for follow-up after transfusion.  CBC done today 06/02/2018 reviewed and showed WBC 6.9, Hb 7.2 Which is improved from 5.5 on 05/26/2018 prior to transfusion.  Plts 288,000.  Ferritin was noted to be decreased at 6 on labs done 05/26/2018.  He will be set up for repeat IV iron with Injectafer 750 mg IV D1 and D8.  He will have repeat labs 1 week after IV iron completed.  He reports he is scheduled for colonoscopy and EGD in August.  Will follow-up results.  I have discussed with him that if counts worsen again and no etiology found from GI evaluation he will be recommended for bone marrow biopsy.    He will be seen for follow-up in 07/2018 after GI evaluation and repeat IV iron completed.    Prior Anemia work-up showed normal hemoglobin electrophoresis, SPEP showed minimal m-spike of 0.2 g/dl. Pt had a normal B12, folate, haptoglobin, LDH.  Ferritin was decreased at 5.  He was last treated with IV iron on 03/20/2018 and 03/27/2018.    2. Monoclonal gammopathy.  SPEP showed minimal m spike  of 0.2 g/d.  Ifix is normal.  He has a normal FLC ratio of 1.43.  If counts fail to improve after IV iron and no abnormality noted on GI work-up to explain anemia, he will be recommended for bone marrow biopsy.    3.  Respiratory failure requiring intubation.  Patient denies any respiratory symptoms since his hospitalization in [place].  Follow-up with Dr. Luan Pulling as directed.    4.  Hypertension.  Blood pressure is 121/57.  Follow-up with PCP.  5.  B12 deficiency.  Pt has normal B12 levels.  He is referred to GI for evaluation due to the severity of his anemia.  6.  Smoking.  Patient reports he has discontinued smoking in February 2019.  Will consider chest imaging for follow-up once GI workup completed.    Interval History:  Historical data obtained from the note dated 04/02/2018.  82 year old male referred by Dr. Luan Pulling for evaluation of anemia.  The patient was seen in the emergency room due to severe anemia.  He reported he had been feeling tired, also dizzy.  He had been seen at [facility] and was transferred to [facility] for evaluation.  Labs done showed a white count of 8.6 hemoglobin 6.7 platelets 307,000.  PT INR was within normal limits.  Potassium was 3.8 bicarb was 18 creatinine was 1.18.  Labs done 01/21/2018 showed a white count 11.4 hemoglobin 8.9 platelets 284,000.  MCV was 75.  H&H done 03/03/2018 showed a hemoglobin of 6.  He was last transfused 03/03/2018.  He reports craving ice.  He denies any blood in his stool or his urine.  He had been offered GI evaluation but refused GI evaluation.  He reports he  is no longer taking iron supplements.  He denies any family history of leukemia, lymphoma, hemoglobinopathy.  He was a longtime smoker reportedly last smoked in February 2019.  He reports he was hospitalized in [place] for shortness of breath and was intubated.  He was found to have IDA and was treated with IV iron on 03/20/2018 and 03/27/2018.    Labs done 03/16/2018 showed a  white count 8.4 hemoglobin 7.7 MCV 72 platelets 388,000. Ferritin was 5, Creatinine is 1.02.   Current Status:  Pt is seen today for follow-up.  He was recently transfused.  He reports he feels better after transfusion.  He is scheduled for GI evaluation in August. He denies any blood in stool or urine.     Problem List Patient Active Problem List   Diagnosis Date Noted  . Iron deficiency anemia due to chronic blood loss [D50.0] 03/17/2018  . Open wound of penis [S31.20XA] 01/21/2018  . Symptomatic anemia [D64.9] 01/20/2018  . Hypertension [I10] 01/20/2018  . Hypercholesteremia [E78.00] 01/20/2018  . GERD (gastroesophageal reflux disease) [K21.9] 01/20/2018  . Vitamin B deficiency [E53.9] 01/20/2018    Past Medical History Past Medical History:  Diagnosis Date  . Anemia   . GERD (gastroesophageal reflux disease)   . Hypercholesteremia   . Hypertension   . Iron deficiency anemia due to chronic blood loss 03/17/2018  . Vitamin B deficiency    patient denies    Past Surgical History Past Surgical History:  Procedure Laterality Date  . none      Family History Family History  Problem Relation Age of Onset  . Colon cancer Neg Hx      Social History  reports that he quit smoking about 5 months ago. His smoking use included cigarettes. He has a 33.50 pack-year smoking history. He has never used smokeless tobacco. He reports that he does not drink alcohol or use drugs.  Medications  Current Outpatient Medications:  .  amLODipine (NORVASC) 5 MG tablet, Take 5 mg by mouth daily., Disp: , Rfl:  .  aspirin EC 81 MG tablet, Take 81 mg by mouth daily. , Disp: , Rfl:  .  atorvastatin (LIPITOR) 10 MG tablet, Take 10 mg by mouth daily at 6 PM., Disp: , Rfl:  .  carvedilol (COREG) 12.5 MG tablet, Take 12.5 mg by mouth 2 (two) times daily with a meal., Disp: , Rfl:  .  multivitamin-iron-minerals-folic acid (CENTRUM) chewable tablet, Chew 1 tablet by mouth daily., Disp: , Rfl:  .   pantoprazole (PROTONIX) 40 MG tablet, Take 40 mg by mouth 2 (two) times daily., Disp: , Rfl:  .  polyethylene glycol-electrolytes (TRILYTE) 420 g solution, Take 4,000 mLs by mouth as directed., Disp: 4000 mL, Rfl: 0 .  vitamin C (ASCORBIC ACID) 500 MG tablet, Take 500 mg by mouth daily., Disp: , Rfl:  .  vitamin E 400 UNIT capsule, Take 800 Units by mouth daily., Disp: , Rfl:   Allergies Patient has no known allergies.  Review of Systems Review of Systems - Oncology ROS negative    Physical Exam  Vitals Wt Readings from Last 3 Encounters:  06/02/18 141 lb (64 kg)  05/05/18 140 lb (63.5 kg)  04/02/18 135 lb 8 oz (61.5 kg)   Temp Readings from Last 3 Encounters:  06/02/18 97.9 F (36.6 C) (Oral)  05/27/18 98.1 F (36.7 C) (Oral)  05/05/18 (!) 97 F (36.1 C) (Oral)   BP Readings from Last 3 Encounters:  06/02/18 (!) 121/57  05/27/18 Marland Kitchen)  127/49  05/05/18 129/84   Pulse Readings from Last 3 Encounters:  06/02/18 87  05/27/18 63  05/05/18 95   Constitutional: Well-developed, well-nourished, and in no distress.   HENT: Head: Normocephalic and atraumatic.  Mouth/Throat: No oropharyngeal exudate. Mucosa moist. Eyes: Pupils are equal, round, and reactive to light. Conjunctivae are normal. No scleral icterus.  Neck: Normal range of motion. Neck supple. No JVD present.  Cardiovascular: Normal rate, regular rhythm and normal heart sounds.  Exam reveals no gallop and no friction rub.   No murmur heard. Pulmonary/Chest: Effort normal and breath sounds normal. No respiratory distress. No wheezes.No rales.  Abdominal: Soft. Bowel sounds are normal. No distension. There is no tenderness. There is no guarding.  Musculoskeletal: No edema or tenderness.  Lymphadenopathy: No cervical, axillary or supraclavicular adenopathy.  Neurological: Alert and oriented to person, place, and time. No cranial nerve deficit.  Skin: Skin is warm and dry. No rash noted. No erythema. No pallor.   Psychiatric: Affect and judgment normal.   Labs Appointment on 06/02/2018  Component Date Value Ref Range Status  . WBC 06/02/2018 6.9  4.0 - 10.5 K/uL Final  . RBC 06/02/2018 3.59* 4.22 - 5.81 MIL/uL Final  . Hemoglobin 06/02/2018 7.2* 13.0 - 17.0 g/dL Final  . HCT 06/02/2018 25.9* 39.0 - 52.0 % Final  . MCV 06/02/2018 72.1* 78.0 - 100.0 fL Final  . MCH 06/02/2018 20.1* 26.0 - 34.0 pg Final  . MCHC 06/02/2018 27.8* 30.0 - 36.0 g/dL Final  . RDW 06/02/2018 28.2* 11.5 - 15.5 % Final  . Platelets 06/02/2018 288  150 - 400 K/uL Final   Comment: PLATELET COUNT CONFIRMED BY SMEAR SPECIMEN CHECKED FOR CLOTS   . Neutrophils Relative % 06/02/2018 63  % Final  . Lymphocytes Relative 06/02/2018 15  % Final  . Monocytes Relative 06/02/2018 18  % Final  . Eosinophils Relative 06/02/2018 3  % Final  . Basophils Relative 06/02/2018 1  % Final  . Neutro Abs 06/02/2018 4.4  1.7 - 7.7 K/uL Final  . Lymphs Abs 06/02/2018 1.0  0.7 - 4.0 K/uL Final  . Monocytes Absolute 06/02/2018 1.2* 0.1 - 1.0 K/uL Final  . Eosinophils Absolute 06/02/2018 0.2  0.0 - 0.7 K/uL Final  . Basophils Absolute 06/02/2018 0.1  0.0 - 0.1 K/uL Final  . RBC Morphology 06/02/2018 POLYCHROMASIA PRESENT   Final   Comment: Schistocytes present ANISOCYTES [employer] CELLS Performed at [facility], [address], [place], [place] [address]      Pathology Orders Placed This Encounter  Procedures  . CBC with Differential/Platelet    Standing Status:   Future    Number of Occurrences:   1    Standing Expiration Date:   06/03/2019  . CBC with Differential/Platelet    Standing Status:   Future    Standing Expiration Date:   06/03/2019  . Comprehensive metabolic panel    Standing Status:   Future    Standing Expiration Date:   06/03/2019  . Lactate dehydrogenase    Standing Status:   Future    Standing Expiration Date:   06/03/2019  . Ferritin    Standing Status:   Future    Standing Expiration Date:   06/03/2019  . CBC with  Differential/Platelet    Standing Status:   Future    Standing Expiration Date:   06/03/2019  . Comprehensive metabolic panel    Standing Status:   Future    Standing Expiration Date:   06/03/2019  .  Lactate dehydrogenase    Standing Status:   Future    Standing Expiration Date:   06/03/2019  . Ferritin    Standing Status:   Future    Standing Expiration Date:   06/03/2019  . Protein electrophoresis, serum    Standing Status:   Future    Standing Expiration Date:   06/03/2019  . IgG, IgA, IgM    Standing Status:   Future    Standing Expiration Date:   06/03/2019  . Beta 2 microglobulin, serum    Standing Status:   Future    Standing Expiration Date:   06/03/2019  . Kappa/lambda light chains    Standing Status:   Future    Standing Expiration Date:   06/03/2019       Zoila Shutter MD

## 2018-06-02 NOTE — Patient Instructions (Signed)
[facility] at [facility] Discharge Instructions  Today you saw Dr. Higgs   Thank you for choosing [facility] at [facility] to provide your oncology and hematology care.  To afford each patient quality time with our provider, please arrive at least 15 minutes before your scheduled appointment time.   If you have a lab appointment with the Cancer Center please come in thru the  Main Entrance and check in at the main information desk  You need to re-schedule your appointment should you arrive 10 or more minutes late.  We strive to give you quality time with our providers, and arriving late affects you and other patients whose appointments are after yours.  Also, if you no show three or more times for appointments you may be dismissed from the clinic at the providers discretion.     Again, thank you for choosing [facility].  Our hope is that these requests will decrease the amount of time that you wait before being seen by our physicians.       _____________________________________________________________  Should you have questions after your visit to [facility], please contact our office at [phone] between the hours of 8:00 a.m. and 4:30 p.m.  Voicemails left after 4:00 p.m. will not be returned until the following business day.  For prescription refill requests, have your pharmacy contact our office and allow 72 hours.    Cancer Center Support Programs:   > Cancer Support Group  2nd Tuesday of the month 1pm-2pm, Journey Room   

## 2018-06-02 NOTE — Telephone Encounter (Signed)
Called and spoke to pt's wife, TCS/EGD w/RMR scheduled for 06/24/18 at 2:15pm. Rx for prep sent to pharmacy. Orders entered. Instructions mailed.

## 2018-06-03 NOTE — Telephone Encounter (Signed)
Noted. Thanks.

## 2018-06-10 ENCOUNTER — Encounter (HOSPITAL_COMMUNITY): Payer: Self-pay

## 2018-06-10 ENCOUNTER — Inpatient Hospital Stay (HOSPITAL_COMMUNITY): Payer: PPO | Attending: Internal Medicine

## 2018-06-10 VITALS — BP 139/71 | HR 76 | Temp 98.0°F | Resp 18 | Wt 140.6 lb

## 2018-06-10 DIAGNOSIS — D509 Iron deficiency anemia, unspecified: Secondary | ICD-10-CM | POA: Insufficient documentation

## 2018-06-10 DIAGNOSIS — D500 Iron deficiency anemia secondary to blood loss (chronic): Secondary | ICD-10-CM

## 2018-06-10 MED ORDER — SODIUM CHLORIDE 0.9 % IV SOLN
750.0000 mg | Freq: Once | INTRAVENOUS | Status: AC
  Administered 2018-06-10: 750 mg via INTRAVENOUS
  Filled 2018-06-10: qty 15

## 2018-06-10 MED ORDER — SODIUM CHLORIDE 0.9 % IV SOLN
INTRAVENOUS | Status: DC
  Administered 2018-06-10: 14:00:00 via INTRAVENOUS

## 2018-06-10 NOTE — Patient Instructions (Signed)
[facility] at [facility] Discharge Instructions  Received Injectafer infusion today. Follow-up as scheduled. Call clinic for any questions or concerns   Thank you for choosing [facility] at [facility] to provide your oncology and hematology care.  To afford each patient quality time with our provider, please arrive at least 15 minutes before your scheduled appointment time.   If you have a lab appointment with the Cancer Center please come in thru the  Main Entrance and check in at the main information desk  You need to re-schedule your appointment should you arrive 10 or more minutes late.  We strive to give you quality time with our providers, and arriving late affects you and other patients whose appointments are after yours.  Also, if you no show three or more times for appointments you may be dismissed from the clinic at the providers discretion.     Again, thank you for choosing [facility].  Our hope is that these requests will decrease the amount of time that you wait before being seen by our physicians.       _____________________________________________________________  Should you have questions after your visit to [facility], please contact our office at [phone] between the hours of 8:00 a.m. and 4:30 p.m.  Voicemails left after 4:00 p.m. will not be returned until the following business day.  For prescription refill requests, have your pharmacy contact our office and allow 72 hours.    Cancer Center Support Programs:   > Cancer Support Group  2nd Tuesday of the month 1pm-2pm, Journey Room   

## 2018-06-10 NOTE — Progress Notes (Signed)
Jared Schmidt tolerated Injectafer infusion well without complaints or incident. VSS upon discharge. Pt discharged self ambulatory in satisfactory condition accompanied by his wife

## 2018-06-18 ENCOUNTER — Other Ambulatory Visit: Payer: Self-pay

## 2018-06-18 ENCOUNTER — Inpatient Hospital Stay (HOSPITAL_COMMUNITY): Payer: PPO

## 2018-06-18 ENCOUNTER — Encounter (HOSPITAL_COMMUNITY): Payer: Self-pay

## 2018-06-18 VITALS — BP 126/59 | HR 72 | Temp 97.9°F | Resp 18

## 2018-06-18 DIAGNOSIS — D500 Iron deficiency anemia secondary to blood loss (chronic): Secondary | ICD-10-CM

## 2018-06-18 DIAGNOSIS — D509 Iron deficiency anemia, unspecified: Secondary | ICD-10-CM | POA: Diagnosis not present

## 2018-06-18 MED ORDER — SODIUM CHLORIDE 0.9 % IV SOLN
750.0000 mg | Freq: Once | INTRAVENOUS | Status: AC
  Administered 2018-06-18: 750 mg via INTRAVENOUS
  Filled 2018-06-18: qty 15

## 2018-06-18 MED ORDER — SODIUM CHLORIDE 0.9 % IV SOLN
INTRAVENOUS | Status: DC
  Administered 2018-06-18: 13:00:00 via INTRAVENOUS

## 2018-06-18 NOTE — Progress Notes (Signed)
Tolerated infusion w/o adverse reaction.  Alert, in no distress.  VSS.  Discharged ambulatory in c/o spouse.  

## 2018-06-22 ENCOUNTER — Inpatient Hospital Stay (HOSPITAL_COMMUNITY): Payer: PPO

## 2018-06-22 DIAGNOSIS — D509 Iron deficiency anemia, unspecified: Secondary | ICD-10-CM | POA: Diagnosis not present

## 2018-06-22 DIAGNOSIS — D472 Monoclonal gammopathy: Secondary | ICD-10-CM

## 2018-06-22 DIAGNOSIS — D500 Iron deficiency anemia secondary to blood loss (chronic): Secondary | ICD-10-CM

## 2018-06-22 LAB — COMPREHENSIVE METABOLIC PANEL
ALT: 13 U/L (ref 0–44)
AST: 22 U/L (ref 15–41)
Albumin: 3.4 g/dL — ABNORMAL LOW (ref 3.5–5.0)
Alkaline Phosphatase: 78 U/L (ref 38–126)
Anion gap: 7 (ref 5–15)
BUN: 17 mg/dL (ref 8–23)
CHLORIDE: 109 mmol/L (ref 98–111)
CO2: 24 mmol/L (ref 22–32)
CREATININE: 0.86 mg/dL (ref 0.61–1.24)
Calcium: 8.3 mg/dL — ABNORMAL LOW (ref 8.9–10.3)
GFR calc non Af Amer: >60 mL/min (ref >60)
Glucose, Bld: 102 mg/dL — ABNORMAL HIGH (ref 70–99)
Potassium: 3.7 mmol/L (ref 3.5–5.1)
Sodium: 140 mmol/L (ref 135–145)
Total Bilirubin: 0.5 mg/dL (ref 0.3–1.2)
Total Protein: 6 g/dL — ABNORMAL LOW (ref 6.5–8.1)

## 2018-06-22 LAB — CBC WITH DIFFERENTIAL/PLATELET
BASOS PCT: 1 %
Basophils Absolute: 0.1 10*3/uL (ref 0.0–0.1)
EOS ABS: 0.2 10*3/uL (ref 0.0–0.7)
Eosinophils Relative: 2 %
HCT: 28.3 % — ABNORMAL LOW (ref 39.0–52.0)
Hemoglobin: 7.5 g/dL — ABNORMAL LOW (ref 13.0–17.0)
Lymphocytes Relative: 10 %
Lymphs Abs: 0.9 10*3/uL (ref 0.7–4.0)
MCH: 22.5 pg — ABNORMAL LOW (ref 26.0–34.0)
MCHC: 26.5 g/dL — ABNORMAL LOW (ref 30.0–36.0)
MCV: 84.7 fL (ref 78.0–100.0)
Monocytes Absolute: 0.8 10*3/uL (ref 0.1–1.0)
Monocytes Relative: 10 %
NEUTROS PCT: 77 %
Neutro Abs: 6.5 10*3/uL (ref 1.7–7.7)
PLATELETS: 430 10*3/uL — AB (ref 150–400)
RBC: 3.34 MIL/uL — ABNORMAL LOW (ref 4.22–5.81)
RDW: 34.3 % — ABNORMAL HIGH (ref 11.5–15.5)
WBC: 8.5 10*3/uL (ref 4.0–10.5)

## 2018-06-22 LAB — FERRITIN: FERRITIN: 796 ng/mL — AB (ref 24–336)

## 2018-06-22 LAB — LACTATE DEHYDROGENASE: LDH: 139 U/L (ref 98–192)

## 2018-06-24 ENCOUNTER — Ambulatory Visit (HOSPITAL_COMMUNITY)
Admission: RE | Admit: 2018-06-24 | Discharge: 2018-06-24 | Disposition: A | Discharge status: Home / Self Care | Payer: PPO | Source: Ambulatory Visit | Attending: Internal Medicine | Admitting: Internal Medicine

## 2018-06-24 ENCOUNTER — Encounter (HOSPITAL_COMMUNITY)
Admission: RE | Disposition: A | Discharge status: Home / Self Care | Payer: Self-pay | Source: Ambulatory Visit | Attending: Internal Medicine

## 2018-06-24 ENCOUNTER — Other Ambulatory Visit: Payer: Self-pay

## 2018-06-24 ENCOUNTER — Encounter (HOSPITAL_COMMUNITY): Payer: Self-pay | Admitting: *Deleted

## 2018-06-24 DIAGNOSIS — Z87891 Personal history of nicotine dependence: Secondary | ICD-10-CM | POA: Insufficient documentation

## 2018-06-24 DIAGNOSIS — K219 Gastro-esophageal reflux disease without esophagitis: Secondary | ICD-10-CM | POA: Insufficient documentation

## 2018-06-24 DIAGNOSIS — K5730 Diverticulosis of large intestine without perforation or abscess without bleeding: Secondary | ICD-10-CM | POA: Diagnosis not present

## 2018-06-24 DIAGNOSIS — K3189 Other diseases of stomach and duodenum: Secondary | ICD-10-CM | POA: Insufficient documentation

## 2018-06-24 DIAGNOSIS — K2950 Unspecified chronic gastritis without bleeding: Secondary | ICD-10-CM | POA: Diagnosis not present

## 2018-06-24 DIAGNOSIS — D509 Iron deficiency anemia, unspecified: Secondary | ICD-10-CM | POA: Insufficient documentation

## 2018-06-24 DIAGNOSIS — Z7982 Long term (current) use of aspirin: Secondary | ICD-10-CM | POA: Insufficient documentation

## 2018-06-24 DIAGNOSIS — E7800 Pure hypercholesterolemia, unspecified: Secondary | ICD-10-CM | POA: Insufficient documentation

## 2018-06-24 DIAGNOSIS — K635 Polyp of colon: Secondary | ICD-10-CM | POA: Insufficient documentation

## 2018-06-24 DIAGNOSIS — K5140 Inflammatory polyps of colon without complications: Secondary | ICD-10-CM | POA: Diagnosis not present

## 2018-06-24 DIAGNOSIS — Z79899 Other long term (current) drug therapy: Secondary | ICD-10-CM | POA: Diagnosis not present

## 2018-06-24 DIAGNOSIS — K648 Other hemorrhoids: Secondary | ICD-10-CM | POA: Diagnosis not present

## 2018-06-24 DIAGNOSIS — D500 Iron deficiency anemia secondary to blood loss (chronic): Secondary | ICD-10-CM

## 2018-06-24 DIAGNOSIS — R933 Abnormal findings on diagnostic imaging of other parts of digestive tract: Secondary | ICD-10-CM | POA: Diagnosis not present

## 2018-06-24 DIAGNOSIS — D649 Anemia, unspecified: Secondary | ICD-10-CM

## 2018-06-24 DIAGNOSIS — I10 Essential (primary) hypertension: Secondary | ICD-10-CM | POA: Diagnosis not present

## 2018-06-24 DIAGNOSIS — K269 Duodenal ulcer, unspecified as acute or chronic, without hemorrhage or perforation: Secondary | ICD-10-CM | POA: Diagnosis not present

## 2018-06-24 HISTORY — PX: COLONOSCOPY: SHX5424

## 2018-06-24 HISTORY — PX: ESOPHAGOGASTRODUODENOSCOPY: SHX5428

## 2018-06-24 HISTORY — PX: BIOPSY: SHX5522

## 2018-06-24 HISTORY — PX: POLYPECTOMY: SHX5525

## 2018-06-24 SURGERY — COLONOSCOPY
Anesthesia: Moderate Sedation

## 2018-06-24 MED ORDER — LIDOCAINE VISCOUS HCL 2 % MT SOLN
OROMUCOSAL | Status: AC
  Filled 2018-06-24: qty 15

## 2018-06-24 MED ORDER — ONDANSETRON HCL 4 MG/2ML IJ SOLN
INTRAMUSCULAR | Status: DC | PRN
  Administered 2018-06-24: 4 mg via INTRAVENOUS

## 2018-06-24 MED ORDER — MEPERIDINE HCL 50 MG/ML IJ SOLN
INTRAMUSCULAR | Status: AC
  Filled 2018-06-24: qty 1

## 2018-06-24 MED ORDER — MIDAZOLAM HCL 5 MG/5ML IJ SOLN
INTRAMUSCULAR | Status: DC | PRN
  Administered 2018-06-24 (×2): 1 mg via INTRAVENOUS
  Administered 2018-06-24: 2 mg via INTRAVENOUS
  Administered 2018-06-24 (×2): 1 mg via INTRAVENOUS

## 2018-06-24 MED ORDER — MIDAZOLAM HCL 5 MG/5ML IJ SOLN
INTRAMUSCULAR | Status: AC
  Filled 2018-06-24: qty 5

## 2018-06-24 MED ORDER — MEPERIDINE HCL 100 MG/ML IJ SOLN
INTRAMUSCULAR | Status: DC | PRN
  Administered 2018-06-24: 25 mg via INTRAVENOUS

## 2018-06-24 MED ORDER — SODIUM CHLORIDE 0.9 % IV SOLN
INTRAVENOUS | Status: DC
  Administered 2018-06-24: 13:00:00 via INTRAVENOUS

## 2018-06-24 MED ORDER — ONDANSETRON HCL 4 MG/2ML IJ SOLN
INTRAMUSCULAR | Status: AC
  Filled 2018-06-24: qty 2

## 2018-06-24 MED ORDER — LIDOCAINE VISCOUS HCL 2 % MT SOLN
OROMUCOSAL | Status: DC | PRN
  Administered 2018-06-24: 1 via OROMUCOSAL

## 2018-06-24 NOTE — Discharge Instructions (Signed)
°Colonoscopy °Discharge Instructions ° °Read the instructions outlined below and refer to this sheet in the next few weeks. These discharge instructions provide you with general information on caring for yourself after you leave the hospital. Your doctor may also give you specific instructions. While your treatment has been planned according to the most current medical practices available, unavoidable complications occasionally occur. If you have any problems or questions after discharge, call Dr. Rourk at [phone]. °ACTIVITY °· You may resume your regular activity, but move at a slower pace for the next 24 hours.  °· Take frequent rest periods for the next 24 hours.  °· Walking will help get rid of the air and reduce the bloated feeling in your belly (abdomen).  °· No driving for 24 hours (because of the medicine (anesthesia) used during the test).   °· Do not sign any important legal documents or operate any machinery for 24 hours (because of the anesthesia used during the test).  °NUTRITION °· Drink plenty of fluids.  °· You may resume your normal diet as instructed by your doctor.  °· Begin with a light meal and progress to your normal diet. Heavy or fried foods are harder to digest and may make you feel sick to your stomach (nauseated).  °· Avoid alcoholic beverages for 24 hours or as instructed.  °MEDICATIONS °· You may resume your normal medications unless your doctor tells you otherwise.  °WHAT YOU CAN EXPECT TODAY °· Some feelings of bloating in the abdomen.  °· Passage of more gas than usual.  °· Spotting of blood in your stool or on the toilet paper.  °IF YOU HAD POLYPS REMOVED DURING THE COLONOSCOPY: °· No aspirin products for 7 days or as instructed.  °· No alcohol for 7 days or as instructed.  °· Eat a soft diet for the next 24 hours.  °FINDING OUT THE RESULTS OF YOUR TEST °Not all test results are available during your visit. If your test results are not back during the visit, make an appointment  with your caregiver to find out the results. Do not assume everything is normal if you have not heard from your caregiver or the medical facility. It is important for you to follow up on all of your test results.  °SEEK IMMEDIATE MEDICAL ATTENTION IF: °· You have more than a spotting of blood in your stool.  °· Your belly is swollen (abdominal distention).  °· You are nauseated or vomiting.  °· You have a temperature over 101.  °· You have abdominal pain or discomfort that is severe or gets worse throughout the day.  °EGD °Discharge instructions °Please read the instructions outlined below and refer to this sheet in the next few weeks. These discharge instructions provide you with general information on caring for yourself after you leave the hospital. Your doctor may also give you specific instructions. While your treatment has been planned according to the most current medical practices available, unavoidable complications occasionally occur. If you have any problems or questions after discharge, please call your doctor. °ACTIVITY °· You may resume your regular activity but move at a slower pace for the next 24 hours.  °· Take frequent rest periods for the next 24 hours.  °· Walking will help expel (get rid of) the air and reduce the bloated feeling in your abdomen.  °· No driving for 24 hours (because of the anesthesia (medicine) used during the test).  °· You may shower.  °· Do not sign any important   legal documents or operate any machinery for 24 hours (because of the anesthesia used during the test).  NUTRITION  Drink plenty of fluids.   You may resume your normal diet.   Begin with a light meal and progress to your normal diet.   Avoid alcoholic beverages for 24 hours or as instructed by your caregiver.  MEDICATIONS  You may resume your normal medications unless your caregiver tells you otherwise.  WHAT YOU CAN EXPECT TODAY  You may experience abdominal discomfort such as a feeling of fullness  or gas pains.  FOLLOW-UP  Your doctor will discuss the results of your test with you.  SEEK IMMEDIATE MEDICAL ATTENTION IF ANY OF THE FOLLOWING OCCUR:  Excessive nausea (feeling sick to your stomach) and/or vomiting.   Severe abdominal pain and distention (swelling).   Trouble swallowing.   Temperature over 101 F (37.8 C).   Rectal bleeding or vomiting of blood.    Colon polyp information provided  Further recommendations to follow  Pending review of pathology report   Colon Polyps Polyps are tissue growths inside the body. Polyps can grow in many places, including the large intestine (colon). A polyp may be a round bump or a mushroom-shaped growth. You could have one polyp or several. Most colon polyps are noncancerous (benign). However, some colon polyps can become cancerous over time. What are the causes? The exact cause of colon polyps is not known. What increases the risk? This condition is more likely to develop in people who:  Have a family history of colon cancer or colon polyps.  Are older than 52 or older than 45 if they are African American.  Have inflammatory bowel disease, such as ulcerative colitis or Crohn disease.  Are overweight.  Smoke cigarettes.  Do not get enough exercise.  Drink too much alcohol.  Eat a diet that is: ? High in fat and red meat. ? Low in fiber.  Had childhood cancer that was treated with abdominal radiation.  What are the signs or symptoms? Most polyps do not cause symptoms. If you have symptoms, they may include:  Blood coming from your rectum when having a bowel movement.  Blood in your stool.The stool may look dark red or black.  A change in bowel habits, such as constipation or diarrhea.  How is this diagnosed? This condition is diagnosed with a colonoscopy. This is a procedure that uses a lighted, flexible scope to look at the inside of your colon. How is this treated? Treatment for this condition involves  removing any polyps that are found. Those polyps will then be tested for cancer. If cancer is found, your health care provider will talk to you about options for colon cancer treatment. Follow these instructions at home: Diet  Eat plenty of fiber, such as fruits, vegetables, and whole grains.  Eat foods that are high in calcium and vitamin D, such as milk, cheese, yogurt, eggs, liver, fish, and broccoli.  Limit foods high in fat, red meats, and processed meats, such as hot dogs, sausage, bacon, and lunch meats.  Maintain a healthy weight, or lose weight if recommended by your health care provider. General instructions  Do not smoke cigarettes.  Do not drink alcohol excessively.  Keep all follow-up visits as told by your health care provider. This is important. This includes keeping regularly scheduled colonoscopies. Talk to your health care provider about when you need a colonoscopy.  Exercise every day or as told by your health care provider. Contact a  health care provider if:  You have new or worsening bleeding during a bowel movement.  You have new or increased blood in your stool.  You have a change in bowel habits.  You unexpectedly lose weight. This information is not intended to replace advice given to you by your health care provider. Make sure you discuss any questions you have with your health care provider. Document Released: 07/17/2004 Document Revised: 03/28/2016 Document Reviewed: 09/11/2015 Elsevier Interactive Patient Education  [employer].

## 2018-06-24 NOTE — Op Note (Signed)
[facility] Patient Name: Jared Schmidt Procedure Date: 06/24/2018 1:34 PM MRN: [id number] Date of Birth: 13-Feb-1936 Attending MD: Gennette Pac , MD CSN: [id number] Age: 82 Admit Type: Outpatient Procedure:                Upper GI endoscopy Indications:              Abnormal CT of the GI tract Providers:                Gennette Pac, MD, Nena Polio, RN, Dyann Ruddle Referring MD:              Medicines:                Midazolam 3 mg IV, Meperidine 25 mg IV Complications:            No immediate complications. Estimated Blood Loss:     Estimated blood loss was minimal. Procedure:                Pre-Anesthesia Assessment:                           - Prior to the procedure, a History and Physical                            was performed, and patient medications and                            allergies were reviewed. The patient's tolerance of                            previous anesthesia was also reviewed. The risks                            and benefits of the procedure and the sedation                            options and risks were discussed with the patient.                            All questions were answered, and informed consent                            was obtained. Prior Anticoagulants: The patient has                            taken no previous anticoagulant or antiplatelet                            agents. ASA Grade Assessment: II - A patient with                            mild systemic disease. After reviewing the risks  and benefits, the patient was deemed in                            satisfactory condition to undergo the procedure.                           After obtaining informed consent, the endoscope was                            passed under direct vision. Throughout the                            procedure, the patient's blood pressure, pulse, and                            oxygen  saturations were monitored continuously. The                            GIF-[id number] ([id number]) was introduced through the                            mouth, and advanced to the second part of duodenum.                            The upper GI endoscopy was accomplished without                            difficulty. The patient tolerated the procedure                            well. Scope In: 2:17:09 PM Scope Out: 2:20:25 PM Total Procedure Duration: 0 hours 3 minutes 16 seconds  Findings:      The examined esophagus was normal.      Multiple erosions were found in the entire examined stomach.      Multiple erosions were found in the duodenal bulb.      The exam was otherwise without abnormality. biopsies of the abnormal       gastric mucosa a cold forceps for histology. Estimated blood loss was       minimal. Impression:               - Normal esophagus.                           - Erosive gastropathy. Biopsied.                           - Duodenal erosions.                           - The examination was otherwise normal. Moderate Sedation:      Moderate (conscious) sedation was administered by the endoscopy nurse       and supervised by the endoscopist. The following parameters were       monitored: oxygen saturation, heart rate, blood pressure, respiratory       rate, EKG, adequacy of pulmonary ventilation, and  response to care.       Total physician intraservice time was 11 minutes. Recommendation:           - Patient has a contact number available for                            emergencies. The signs and symptoms of potential                            delayed complications were discussed with the                            patient. Return to normal activities tomorrow.                            Written discharge instructions were provided to the                            patient.                           - Resume previous diet.                           - Continue present  medications.                           - No repeat upper endoscopy.                           - Return to GI office after studies are complete.                            See colonoscopy report. Procedure Code(s):        --- Professional ---                           [phone], Esophagogastroduodenoscopy, flexible,                            transoral; with biopsy, single or multiple                           G0500, Moderate sedation services provided by the                            same physician or other qualified health care                            professional performing a gastrointestinal                            endoscopic service that sedation supports,                            requiring the presence of an independent trained  observer to assist in the monitoring of the                            patient's level of consciousness and physiological                            status; initial 15 minutes of intra-service time;                            patient age 87 years or older (additional time may                            be reported with 29562, as appropriate) Diagnosis Code(s):        --- Professional ---                           K31.89, Other diseases of stomach and duodenum                           K26.9, Duodenal ulcer, unspecified as acute or                            chronic, without hemorrhage or perforation                           R93.3, Abnormal findings on diagnostic imaging of                            other parts of digestive tract CPT copyright 2017 American Medical Association. All rights reserved. The codes documented in this report are preliminary and upon coder review may  be revised to meet current compliance requirements. Gerrit Friends. Shiquan Mathieu, MD Gennette Pac, MD 06/24/2018 1:30:86 PM This report has been signed electronically. Number of Addenda: 0

## 2018-06-24 NOTE — H&P (Signed)
@LOGO @   Primary Care Physician:  Sinda Du, MD Primary Gastroenterologist:  Dr. Gala Romney  Pre-Procedure History & Physical: HPI:  Jared Schmidt is a 82 y.o. male here for for evaluation of an iron deficiency anemia.   No dysphagia.  Abnormal stomach and colon on CT (outside imaging earlier this year).  No prior EGD or colonoscopy.  Past Medical History:  Diagnosis Date  . Anemia   . GERD (gastroesophageal reflux disease)   . Hypercholesteremia   . Hypertension   . Iron deficiency anemia due to chronic blood loss 03/17/2018  . Vitamin B deficiency    patient denies    Past Surgical History:  Procedure Laterality Date  . none      Prior to Admission medications   Medication Sig Start Date End Date Taking? Authorizing Provider  amLODipine (NORVASC) 5 MG tablet Take 5 mg by mouth daily.   Yes [provider]  aspirin EC 81 MG tablet Take 81 mg by mouth daily.    Yes [provider]  atorvastatin (LIPITOR) 10 MG tablet Take 10 mg by mouth daily with supper.    Yes [provider]  carvedilol (COREG) 12.5 MG tablet Take 12.5 mg by mouth 2 (two) times daily with a meal.   Yes [provider]  multivitamin-iron-minerals-folic acid (CENTRUM) chewable tablet Chew 1 tablet by mouth daily.   Yes [provider]  OVER THE COUNTER MEDICATION Apply 1 application topically at bedtime. Znox 20%/ Nystatin Compound Cream   Yes [provider]  pantoprazole (PROTONIX) 40 MG tablet Take 40 mg by mouth 2 (two) times daily.   Yes [provider]  polyethylene glycol-electrolytes (TRILYTE) 420 g solution Take 4,000 mLs by mouth as directed. 06/02/18  Yes Derriana Oser, Cristopher Estimable, MD  triamcinolone cream (KENALOG) 0.1 % Apply 1 application topically daily as needed (for rash).   Yes [provider]  vitamin C (ASCORBIC ACID) 500 MG tablet Take 500 mg by mouth daily.   Yes [provider]  vitamin E 100 UNIT capsule Take 100  Units by mouth daily.    Yes [provider]    Allergies as of 06/02/2018  . (No Known Allergies)    Family History  Problem Relation Age of Onset  . Colon cancer Neg Hx     Social History   Socioeconomic History  . Marital status: Married    Spouse name: [name]  . Number of children: Not on file  . Years of education: Not on file  . Highest education level: 8th grade  Occupational History  . Not on file  Social Needs  . Financial resource strain: Not on file  . Food insecurity:    Worry: Not on file    Inability: Not on file  . Transportation needs:    Medical: Not on file    Non-medical: Not on file  Tobacco Use  . Smoking status: Former Smoker    Packs/day: 0.50    Years: 67.00    Pack years: 33.50    Types: Cigarettes    Last attempt to quit: 12/11/2017    Years since quitting: 0.5  . Smokeless tobacco: Never Used  Substance and Sexual Activity  . Alcohol use: No    Frequency: Never  . Drug use: No  . Sexual activity: Not Currently  Lifestyle  . Physical activity:    Days per week: Not on file    Minutes per session: Not on file  . Stress: Not  on file  Relationships  . Social connections:    Talks on phone: Not on file    Gets together: Not on file    Attends religious service: Not on file    Active member of club or organization: Not on file    Attends meetings of clubs or organizations: Not on file    Relationship status: Not on file  . Intimate partner violence:    Fear of current or ex partner: Not on file    Emotionally abused: Not on file    Physically abused: Not on file    Forced sexual activity: Not on file  Other Topics Concern  . Not on file  Social History Narrative  . Not on file    Review of Systems: See HPI, otherwise negative ROS  Physical Exam: BP (!) 161/84   Pulse 72   Temp 98.4 F (36.9 C) (Oral)   Resp 19   Ht 5\' 7"  (1.702 m)   Wt 64 kg   SpO2 99%   BMI 22.08 kg/m  General:   Alert,   Well-developed, well-nourished, pleasant and cooperative in NAD Neck:  Supple; no masses or thyromegaly. No significant cervical adenopathy. Lungs:  Clear throughout to auscultation.   No wheezes, crackles, or rhonchi. No acute distress. Heart:  Regular rate and rhythm; no murmurs, clicks, rubs,  or gallops. Abdomen: Non-distended, normal bowel sounds.  Soft and nontender without appreciable mass or hepatosplenomegaly.  Pulses:  Normal pulses noted. Extremities:  Without clubbing or edema.  Impression/Plan:  82 year old gentleman iron deficiency anemia.  Abnormal stomach and colon on CT.  No prior EGD or colonoscopy. The risks, benefits, limitations, imponderables and alternatives regarding both EGD and colonoscopy have been reviewed with the patient. Questions have been answered. All parties agreeable.     Notice: This dictation was prepared with Dragon dictation along with smaller phrase technology. Any transcriptional errors that result from this process are unintentional and may not be corrected upon review.

## 2018-06-24 NOTE — Op Note (Signed)
[facility] Patient Name: Jared Schmidt Procedure Date: 06/24/2018 1:42 PM MRN: [id number] Date of Birth: [birth year] Attending MD: Gennette Pac , MD CSN: [id number] Age: 82 Admit Type: Outpatient Procedure:                Colonoscopy Indications:              Abnormal CT of the GI tract Providers:                Gennette Pac, MD, Nena Polio, RN, Dyann Ruddle Referring MD:              Medicines:                Midazolam 6 mg IV, Meperidine 25 mg IV, Ondansetron                            4 mg IV Complications:            No immediate complications. Estimated Blood Loss:     Estimated blood loss: none. Procedure:                Pre-Anesthesia Assessment:                           - Prior to the procedure, a History and Physical                            was performed, and patient medications and                            allergies were reviewed. The patient's tolerance of                            previous anesthesia was also reviewed. The risks                            and benefits of the procedure and the sedation                            options and risks were discussed with the patient.                            All questions were answered, and informed consent                            was obtained. Prior Anticoagulants: The patient has                            taken no previous anticoagulant or antiplatelet                            agents. ASA Grade Assessment: II - A patient with  mild systemic disease. After reviewing the risks                            and benefits, the patient was deemed in                            satisfactory condition to undergo the procedure.                           After obtaining informed consent, the colonoscope                            was passed under direct vision. Throughout the                            procedure, the patient's blood pressure, pulse, and                             oxygen saturations were monitored continuously. The                            CF-HQ190L ([id number] scope was introduced through                            the anus and advanced to the the cecum, identified                            by appendiceal orifice and ileocecal valve. The                            colonoscopy was performed without difficulty. The                            patient tolerated the procedure well. The quality                            of the bowel preparation was adequate. The entire                            colon was well visualized. Scope In: 2:27:38 PM Scope Out: 2:48:01 PM Scope Withdrawal Time: 0 hours 14 minutes 16 seconds  Total Procedure Duration: 0 hours 20 minutes 23 seconds  Findings:      The perianal and digital rectal examinations were normal.      Internal hemorrhoids were found.      A few medium-mouthed diverticula were found in the entire colon.      A 9 mm polyp was found in the sigmoid colon. The polyp was sessile. The       polyp was removed with a hot snare. Resection and retrieval were       complete. Estimated blood loss: none.      The exam was otherwise without abnormality on direct and retroflexion       views. Impression:               - Internal hemorrhoids.                           -  Diverticulosis in the entire examined colon.                           - One 9 mm polyp in the sigmoid colon, removed with                            a hot snare. Resected and retrieved.                           - The examination was otherwise normal on direct                            and retroflexion views. Moderate Sedation:      Moderate (conscious) sedation was administered by the endoscopy nurse       and supervised by the endoscopist. The following parameters were       monitored: oxygen saturation, heart rate, blood pressure, respiratory       rate, EKG, adequacy of pulmonary ventilation, and response to care.        Total physician intraservice time was 39 minutes. Recommendation:           - Patient has a contact number available for                            emergencies. The signs and symptoms of potential                            delayed complications were discussed with the                            patient. Return to normal activities tomorrow.                            Written discharge instructions were provided to the                            patient.                           - Resume previous diet.                           - Continue present medications.                           - Await pathology results.                           - Repeat colonoscopy date to be determined after                            pending pathology results are reviewed for                            surveillance based on pathology results.                           -  Return to GI office (date not yet determined).                            See EGD report. Procedure Code(s):        --- Professional ---                           [phone], Colonoscopy, flexible; with removal of                            tumor(s), polyp(s), or other lesion(s) by snare                            technique                           G0500, Moderate sedation services provided by the                            same physician or other qualified health care                            professional performing a gastrointestinal                            endoscopic service that sedation supports,                            requiring the presence of an independent trained                            observer to assist in the monitoring of the                            patient's level of consciousness and physiological                            status; initial 15 minutes of intra-service time;                            patient age 32 years or older (additional time may                            be reported with [id number], as appropriate)                            [phone], Moderate sedation services provided by the                            same physician or other qualified health care                            professional performing the diagnostic or  therapeutic service that the sedation supports,                            requiring the presence of an independent trained                            observer to assist in the monitoring of the                            patient's level of consciousness and physiological                            status; each additional 15 minutes intraservice                            time (List separately in addition to code for                            primary service)                           [phone], Moderate sedation services provided by the                            same physician or other qualified health care                            professional performing the diagnostic or                            therapeutic service that the sedation supports,                            requiring the presence of an independent trained                            observer to assist in the monitoring of the                            patient's level of consciousness and physiological                            status; each additional 15 minutes intraservice                            time (List separately in addition to code for                            primary service) Diagnosis Code(s):        --- Professional ---                           X91.4, Other hemorrhoids  D12.5, Benign neoplasm of sigmoid colon                           K57.30, Diverticulosis of large intestine without                            perforation or abscess without bleeding                           R93.3, Abnormal findings on diagnostic imaging of                            other parts of digestive tract CPT copyright 2017 [employer]. All rights  reserved. The codes documented in this report are preliminary and upon coder review may  be revised to meet current compliance requirements. Gerrit Friends. Abel Hageman, MD Gennette Pac, MD 06/24/2018 2:52:38 PM This report has been signed electronically. Number of Addenda: 0

## 2018-06-29 ENCOUNTER — Encounter (HOSPITAL_COMMUNITY): Payer: Self-pay | Admitting: Internal Medicine

## 2018-07-01 ENCOUNTER — Encounter: Payer: Self-pay | Admitting: Internal Medicine

## 2018-07-02 ENCOUNTER — Telehealth: Payer: Self-pay

## 2018-07-02 ENCOUNTER — Encounter: Payer: Self-pay | Admitting: Internal Medicine

## 2018-07-02 ENCOUNTER — Encounter: Payer: Self-pay | Admitting: Gastroenterology

## 2018-07-02 NOTE — Telephone Encounter (Signed)
OV made for 10/10 at 1130 with [facility] and letter mailed

## 2018-07-02 NOTE — Telephone Encounter (Signed)
Send letter to patient.  Send copy of letter with path to referring provider and PCP.   Need office visit in 6 weeks with extender to decide about a capsule study of the small intestine

## 2018-07-10 ENCOUNTER — Inpatient Hospital Stay (HOSPITAL_COMMUNITY): Payer: PPO | Attending: Hematology

## 2018-07-10 DIAGNOSIS — D500 Iron deficiency anemia secondary to blood loss (chronic): Secondary | ICD-10-CM

## 2018-07-10 DIAGNOSIS — D472 Monoclonal gammopathy: Secondary | ICD-10-CM | POA: Insufficient documentation

## 2018-07-10 DIAGNOSIS — I10 Essential (primary) hypertension: Secondary | ICD-10-CM | POA: Insufficient documentation

## 2018-07-10 DIAGNOSIS — D509 Iron deficiency anemia, unspecified: Secondary | ICD-10-CM | POA: Insufficient documentation

## 2018-07-10 LAB — COMPREHENSIVE METABOLIC PANEL
ALT: 15 U/L (ref 0–44)
AST: 21 U/L (ref 15–41)
Albumin: 3.5 g/dL (ref 3.5–5.0)
Alkaline Phosphatase: 85 U/L (ref 38–126)
Anion gap: 6 (ref 5–15)
BUN: 16 mg/dL (ref 8–23)
CO2: 23 mmol/L (ref 22–32)
Calcium: 8.3 mg/dL — ABNORMAL LOW (ref 8.9–10.3)
Chloride: 112 mmol/L — ABNORMAL HIGH (ref 98–111)
Creatinine, Ser: 1.02 mg/dL (ref 0.61–1.24)
Glucose, Bld: 107 mg/dL — ABNORMAL HIGH (ref 70–99)
POTASSIUM: 4.6 mmol/L (ref 3.5–5.1)
SODIUM: 141 mmol/L (ref 135–145)
Total Bilirubin: 0.5 mg/dL (ref 0.3–1.2)
Total Protein: 6.3 g/dL — ABNORMAL LOW (ref 6.5–8.1)

## 2018-07-10 LAB — CBC WITH DIFFERENTIAL/PLATELET
BASOS PCT: 1 %
Basophils Absolute: 0 10*3/uL (ref 0.0–0.1)
EOS ABS: 0.2 10*3/uL (ref 0.0–0.7)
EOS PCT: 3 %
HCT: 28.6 % — ABNORMAL LOW (ref 39.0–52.0)
Hemoglobin: 8 g/dL — ABNORMAL LOW (ref 13.0–17.0)
LYMPHS ABS: 0.6 10*3/uL — AB (ref 0.7–4.0)
Lymphocytes Relative: 10 %
MCH: 23.5 pg — AB (ref 26.0–34.0)
MCHC: 28 g/dL — ABNORMAL LOW (ref 30.0–36.0)
MCV: 84.1 fL (ref 78.0–100.0)
Monocytes Absolute: 0.8 10*3/uL (ref 0.1–1.0)
Monocytes Relative: 12 %
NEUTROS PCT: 74 %
Neutro Abs: 4.5 10*3/uL (ref 1.7–7.7)
Platelets: 445 10*3/uL — ABNORMAL HIGH (ref 150–400)
RBC: 3.4 MIL/uL — AB (ref 4.22–5.81)
RDW: 24.3 % — AB (ref 11.5–15.5)
WBC: 6.1 10*3/uL (ref 4.0–10.5)

## 2018-07-10 LAB — FERRITIN: FERRITIN: 96 ng/mL (ref 24–336)

## 2018-07-10 LAB — LACTATE DEHYDROGENASE: LDH: 136 U/L (ref 98–192)

## 2018-07-11 LAB — IGG, IGA, IGM
IGA: 86 mg/dL (ref 61–437)
IGG (IMMUNOGLOBIN G), SERUM: 641 mg/dL — AB (ref 700–1600)

## 2018-07-11 LAB — BETA 2 MICROGLOBULIN, SERUM: Beta-2 Microglobulin: 2 mg/L (ref 0.6–2.4)

## 2018-07-13 LAB — KAPPA/LAMBDA LIGHT CHAINS
KAPPA, LAMDA LIGHT CHAIN RATIO: 1.44 (ref 0.26–1.65)
Kappa free light chain: 24.4 mg/L — ABNORMAL HIGH (ref 3.3–19.4)
Lambda free light chains: 17 mg/L (ref 5.7–26.3)

## 2018-07-13 LAB — PROTEIN ELECTROPHORESIS, SERUM
A/G RATIO SPE: 1.5 (ref 0.7–1.7)
ALPHA-2-GLOBULIN: 0.6 g/dL (ref 0.4–1.0)
Albumin ELP: 3.4 g/dL (ref 2.9–4.4)
Alpha-1-Globulin: 0.3 g/dL (ref 0.0–0.4)
BETA GLOBULIN: 0.8 g/dL (ref 0.7–1.3)
Gamma Globulin: 0.5 g/dL (ref 0.4–1.8)
Globulin, Total: 2.3 g/dL (ref 2.2–3.9)
Total Protein ELP: 5.7 g/dL — ABNORMAL LOW (ref 6.0–8.5)

## 2018-07-17 ENCOUNTER — Inpatient Hospital Stay (HOSPITAL_BASED_OUTPATIENT_CLINIC_OR_DEPARTMENT_OTHER): Payer: PPO | Admitting: Internal Medicine

## 2018-07-17 ENCOUNTER — Encounter (HOSPITAL_COMMUNITY): Payer: Self-pay | Admitting: Internal Medicine

## 2018-07-17 DIAGNOSIS — R05 Cough: Secondary | ICD-10-CM

## 2018-07-17 DIAGNOSIS — D472 Monoclonal gammopathy: Secondary | ICD-10-CM

## 2018-07-17 DIAGNOSIS — I10 Essential (primary) hypertension: Secondary | ICD-10-CM | POA: Diagnosis not present

## 2018-07-17 DIAGNOSIS — D509 Iron deficiency anemia, unspecified: Secondary | ICD-10-CM

## 2018-07-17 DIAGNOSIS — R634 Abnormal weight loss: Secondary | ICD-10-CM

## 2018-07-17 DIAGNOSIS — R059 Cough, unspecified: Secondary | ICD-10-CM

## 2018-07-17 DIAGNOSIS — Z720 Tobacco use: Secondary | ICD-10-CM

## 2018-07-17 DIAGNOSIS — D508 Other iron deficiency anemias: Secondary | ICD-10-CM

## 2018-07-17 NOTE — Progress Notes (Signed)
Diagnosis Cough - Plan: CT CHEST W CONTRAST, CBC with Differential/Platelet, Comprehensive metabolic panel, Lactate dehydrogenase, Ferritin, Hepatitis panel, acute, Hepatitis B surface antibody, Hepatitis B surface antigen, Hepatitis B core antibody, total, Hepatitis C RNA quantitative, HIV antibody (with reflex)  Other iron deficiency anemia - Plan: CT CHEST W CONTRAST, CT Abdomen Pelvis W Contrast, CBC with Differential/Platelet, Comprehensive metabolic panel, Lactate dehydrogenase, Ferritin, Hepatitis panel, acute, Hepatitis B surface antibody, Hepatitis B surface antigen, Hepatitis B core antibody, total, Hepatitis C RNA quantitative, HIV antibody (with reflex)  Tobacco use - Plan: CT CHEST W CONTRAST, CBC with Differential/Platelet, Comprehensive metabolic panel, Lactate dehydrogenase, Ferritin, Hepatitis panel, acute, Hepatitis B surface antibody, Hepatitis B surface antigen, Hepatitis B core antibody, total, Hepatitis C RNA quantitative, HIV antibody (with reflex)  Abnormal weight loss - Plan: CT Abdomen Pelvis W Contrast  Staging Cancer Staging No matching staging information was found for the patient.  Assessment and Plan:  1.  Iron deficiency anemia.  Pt is here today for follow-up.  He was last treated with IV iron on 06/18/2018. Labs done 07/10/2018 reviewed and showed WBC 6 HB 8 Plts 445,000.  SPEP negative, Chemistries WNL with K+ 4.6 Cr 1 and normal LFTS.  Ferritin is 96.    Pt has been seen by GI and had colonoscopy done 06/24/2018 that showed internal hemorrhoids, diverticulosis and 1 polyp was removed with pathology returning as  Hyperplastic polyp and gastritis.  There was no evidence of malignancy.    I discussed with the patient iron levels are improved after IV iron but Hb remains decreased.    Prior Anemia work-up showed normal hemoglobin electrophoresis.  Pt had a normal B12, folate, haptoglobin, LDH.  Ferritin was decreased at 5.  He was last treated with IV iron on  03/20/2018 and 03/27/2018.    Due to persistent anemia, he is set up for CT CAP for further evaluation.  If CT negative, he will be recommended for bone marrow biopsy due to possibility for MDS.  Pt will RTC  for follow-up to go over scans.    2. Monoclonal gammopathy.  SPEP showed minimal m spike of 0.2 g/d.  Ifix is normal.  He has a normal FLC ratio of 1.44.  SPEP was repeated on 07/10/2018 and was negative.  IF CT scan negative he will be set up for Bone marrow biopsy for further evaluation.    3.  Respiratory failure requiring intubation.  Patient denies any respiratory symptoms since his hospitalization in [place].  Follow-up with Dr. Luan Pulling as directed.    4.  Hypertension.  Blood pressure is 121/57.  Follow-up with PCP.  5.  Smoking.  Patient reports he has discontinued smoking in February 2019.  CT chest ordered.    6.  Itching.  CT CAP done to evaluate for adenopathy.  Benadryl prn recommended.    Greater than 25 minutes spent with more than 50% spent in counseling and coordination of care.    Interval History:  Historical data obtained from the note dated 04/02/2018.  82 year old male referred by Dr. Luan Pulling for evaluation of anemia.  The patient was seen in the emergency room due to severe anemia.  He reported he had been feeling tired, also dizzy.  He had been seen at [facility] and was transferred to [facility] for evaluation.  Labs done showed a white count of 8.6 hemoglobin 6.7 platelets 307,000.  PT INR was within normal limits.  Potassium was 3.8 bicarb was 18 creatinine was  1.18.  Labs done 01/21/2018 showed a white count 11.4 hemoglobin 8.9 platelets 284,000.  MCV was 75.  H&H done 03/03/2018 showed a hemoglobin of 6.  He was last transfused 03/03/2018.  He reports craving ice.  He denies any blood in his stool or his urine.  He had been offered GI evaluation but refused GI evaluation.  He reports he is no longer taking iron supplements.  He denies any family history of  leukemia, lymphoma, hemoglobinopathy.  He was a longtime smoker reportedly last smoked in February 2019.  He reports he was hospitalized in [place] for shortness of breath and was intubated.  He was found to have IDA and was treated with IV iron on 03/20/2018 and 03/27/2018.    Labs done 03/16/2018 showed a white count 8.4 hemoglobin 7.7 MCV 72 platelets 388,000. Ferritin was 5, Creatinine is 1.02.   Current Status:  Pt is seen today for follow-up.  He is here to go over labs.  He reports frequent itching.  He denies any blood in stool or urine.    Problem List Patient Active Problem List   Diagnosis Date Noted  . Iron deficiency anemia due to chronic blood loss [D50.0] 03/17/2018  . Open wound of penis [S31.20XA] 01/21/2018  . Symptomatic anemia [D64.9] 01/20/2018  . Hypertension [I10] 01/20/2018  . Hypercholesteremia [E78.00] 01/20/2018  . GERD (gastroesophageal reflux disease) [K21.9] 01/20/2018  . Vitamin B deficiency [E53.9] 01/20/2018    Past Medical History Past Medical History:  Diagnosis Date  . Anemia   . GERD (gastroesophageal reflux disease)   . Hypercholesteremia   . Hypertension   . Iron deficiency anemia due to chronic blood loss 03/17/2018  . Vitamin B deficiency    patient denies    Past Surgical History Past Surgical History:  Procedure Laterality Date  . BIOPSY  06/24/2018   Procedure: BIOPSY;  Surgeon: Daneil Dolin, MD;  [facility];  Service: Endoscopy;;  gastric   . COLONOSCOPY N/A 06/24/2018   Procedure: COLONOSCOPY;  Surgeon: Daneil Dolin, MD;  [facility];  Service: Endoscopy;  Laterality: N/A;  2:15pm  . ESOPHAGOGASTRODUODENOSCOPY N/A 06/24/2018   Procedure: ESOPHAGOGASTRODUODENOSCOPY (EGD);  Surgeon: Daneil Dolin, MD;  [facility];  Service: Endoscopy;  Laterality: N/A;  . none    . POLYPECTOMY  06/24/2018   Procedure: POLYPECTOMY;  Surgeon: Daneil Dolin, MD;  [facility];  Service: Endoscopy;;   sigmoid polyp hs    Family History Family History  Problem Relation Age of Onset  . Colon cancer Neg Hx      Social History  reports that he quit smoking about 7 months ago. His smoking use included cigarettes. He has a 33.50 pack-year smoking history. He has never used smokeless tobacco. He reports that he does not drink alcohol or use drugs.  Medications  Current Outpatient Medications:  .  amLODipine (NORVASC) 5 MG tablet, Take 5 mg by mouth daily., Disp: , Rfl:  .  aspirin EC 81 MG tablet, Take 81 mg by mouth daily. , Disp: , Rfl:  .  atorvastatin (LIPITOR) 10 MG tablet, Take 10 mg by mouth daily with supper. , Disp: , Rfl:  .  carvedilol (COREG) 12.5 MG tablet, Take 12.5 mg by mouth 2 (two) times daily with a meal., Disp: , Rfl:  .  multivitamin-iron-minerals-folic acid (CENTRUM) chewable tablet, Chew 1 tablet by mouth daily., Disp: , Rfl:  .  OVER THE COUNTER MEDICATION, Apply 1  application topically at bedtime. Znox 20%/ Nystatin Compound Cream, Disp: , Rfl:  .  pantoprazole (PROTONIX) 40 MG tablet, Take 40 mg by mouth 2 (two) times daily., Disp: , Rfl:  .  triamcinolone cream (KENALOG) 0.1 %, Apply 1 application topically daily as needed (for rash)., Disp: , Rfl:  .  vitamin C (ASCORBIC ACID) 500 MG tablet, Take 500 mg by mouth daily., Disp: , Rfl:  .  vitamin E 100 UNIT capsule, Take 100 Units by mouth daily. , Disp: , Rfl:   Allergies Patient has no known allergies.  Review of Systems Review of Systems - Oncology ROS as per HPI otherwise 12 point ROS is negative.   Physical Exam  Vitals Wt Readings from Last 3 Encounters:  07/17/18 141 lb 12.8 oz (64.3 kg)  06/24/18 141 lb (64 kg)  06/10/18 140 lb 9.6 oz (63.8 kg)   Temp Readings from Last 3 Encounters:  07/17/18 97.6 F (36.4 C) (Oral)  06/24/18 97.8 F (36.6 C) (Oral)  06/18/18 97.9 F (36.6 C) (Oral)   BP Readings from Last 3 Encounters:  07/17/18 (!) 161/53  06/24/18 113/69  06/18/18 (!) 126/59    Pulse Readings from Last 3 Encounters:  07/17/18 78  06/24/18 77  06/18/18 72    Constitutional: Well-developed, well-nourished, and in no distress.   HENT: Head: Normocephalic and atraumatic.  Mouth/Throat: No oropharyngeal exudate. Mucosa moist. Eyes: Pupils are equal, round, and reactive to light. Conjunctivae are normal. No scleral icterus.  Neck: Normal range of motion. Neck supple. No JVD present.  Cardiovascular: Normal rate, regular rhythm and normal heart sounds.  Exam reveals no gallop and no friction rub.   No murmur heard. Pulmonary/Chest: Effort normal and breath sounds normal. No respiratory distress. No wheezes.No rales.  Abdominal: Soft. Bowel sounds are normal. No distension. There is no tenderness. There is no guarding.  Musculoskeletal: No edema or tenderness.  Lymphadenopathy: No cervical or supraclavicular adenopathy.  Neurological: Alert and oriented to person, place, and time. No cranial nerve deficit.  Skin: Skin is warm and dry. No rash noted. No erythema. No pallor.  Psychiatric: Affect and judgment normal.   Labs No visits with results within 3 Day(s) from this visit.  Latest known visit with results is:  Appointment on 07/10/2018  Component Date Value Ref Range Status  . WBC 07/10/2018 6.1  4.0 - 10.5 K/uL Final  . RBC 07/10/2018 3.40* 4.22 - 5.81 MIL/uL Final  . Hemoglobin 07/10/2018 8.0* 13.0 - 17.0 g/dL Final  . HCT 07/10/2018 28.6* 39.0 - 52.0 % Final  . MCV 07/10/2018 84.1  78.0 - 100.0 fL Final  . MCH 07/10/2018 23.5* 26.0 - 34.0 pg Final  . MCHC 07/10/2018 28.0* 30.0 - 36.0 g/dL Final  . RDW 07/10/2018 24.3* 11.5 - 15.5 % Final  . Platelets 07/10/2018 445* 150 - 400 K/uL Final  . Neutrophils Relative % 07/10/2018 74  % Final  . Neutro Abs 07/10/2018 4.5  1.7 - 7.7 K/uL Final  . Lymphocytes Relative 07/10/2018 10  % Final  . Lymphs Abs 07/10/2018 0.6* 0.7 - 4.0 K/uL Final  . Monocytes Relative 07/10/2018 12  % Final  . Monocytes Absolute  07/10/2018 0.8  0.1 - 1.0 K/uL Final  . Eosinophils Relative 07/10/2018 3  % Final  . Eosinophils Absolute 07/10/2018 0.2  0.0 - 0.7 K/uL Final  . Basophils Relative 07/10/2018 1  % Final  . Basophils Absolute 07/10/2018 0.0  0.0 - 0.1 K/uL Final  . RBC Morphology  07/10/2018 ANISOCYTES   Final   Comment: Schistocytes present FEW TARGET CELLS Performed at [facility], [address], [place], [place] [address]   . Sodium 07/10/2018 141  135 - 145 mmol/L Final  . Potassium 07/10/2018 4.6  3.5 - 5.1 mmol/L Final  . Chloride 07/10/2018 112* 98 - 111 mmol/L Final  . CO2 07/10/2018 23  22 - 32 mmol/L Final  . Glucose, Bld 07/10/2018 107* 70 - 99 mg/dL Final  . BUN 07/10/2018 16  8 - 23 mg/dL Final  . Creatinine, Ser 07/10/2018 1.02  0.61 - 1.24 mg/dL Final  . Calcium 07/10/2018 8.3* 8.9 - 10.3 mg/dL Final  . Total Protein 07/10/2018 6.3* 6.5 - 8.1 g/dL Final  . Albumin 07/10/2018 3.5  3.5 - 5.0 g/dL Final  . AST 07/10/2018 21  15 - 41 U/L Final  . ALT 07/10/2018 15  0 - 44 U/L Final  . Alkaline Phosphatase 07/10/2018 85  38 - 126 U/L Final  . Total Bilirubin 07/10/2018 0.5  0.3 - 1.2 mg/dL Final  . GFR calc non Af Amer 07/10/2018 >60  >60 mL/min Final  . GFR calc Af Amer 07/10/2018 >60  >60 mL/min Final   Comment: (NOTE) The eGFR has been calculated using the CKD EPI equation. This calculation has not been validated in all clinical situations. eGFR's persistently <60 mL/min signify possible Chronic Kidney Disease.   Georgiann Hahn gap 07/10/2018 6  5 - 15 Final   Performed at [facility], [address], [place], [place] [address]  . LDH 07/10/2018 136  98 - 192 U/L Final   Performed at [facility], [address], [place], [place] [address]  . Ferritin 07/10/2018 96  24 - 336 ng/mL Final   Performed at [facility], [address], [place], [place] [address]  . Total Protein ELP 07/10/2018 5.7* 6.0 - 8.5 g/dL Final  . Albumin ELP 07/10/2018 3.4  2.9 - 4.4 g/dL Final  . Alpha-1-Globulin  07/10/2018 0.3  0.0 - 0.4 g/dL Final  . Alpha-2-Globulin 07/10/2018 0.6  0.4 - 1.0 g/dL Final  . Beta Globulin 07/10/2018 0.8  0.7 - 1.3 g/dL Final  . Gamma Globulin 07/10/2018 0.5  0.4 - 1.8 g/dL Final  . M-Spike, % 07/10/2018 Not Observed  Not Observed g/dL Final  . SPE Interp. 07/10/2018 Comment   Final   Comment: (NOTE) The SPE pattern appears essentially unremarkable. Evidence of monoclonal protein is not apparent. Performed At: [facility] [place], [place] [id number] Rush Farmer MD [id number]   . Comment 07/10/2018 Comment   Final   Comment: (NOTE) Protein electrophoresis scan will follow via computer, mail, or courier delivery.   Marland Kitchen GLOBULIN, TOTAL 07/10/2018 2.3  2.2 - 3.9 g/dL Corrected  . A/G Ratio 07/10/2018 1.5  0.7 - 1.7 Corrected  . IgG (Immunoglobin G), Serum 07/10/2018 641* 700 - 1,600 mg/dL Final  . IgA 07/10/2018 86  61 - 437 mg/dL Final  . IgM (Immunoglobulin M), Srm 07/10/2018 <5* 15 - 143 mg/dL Final   Comment: (NOTE) Result confirmed on concentration. Performed At: [facility] [place], [place] [id number] Rush Farmer MD [id number]   . Beta-2 Microglobulin 07/10/2018 2.0  0.6 - 2.4 mg/L Final   Comment: (NOTE) Siemens Immulite 2000 Immunochemiluminometric assay (ICMA) Values obtained with different assay methods or kits cannot be used interchangeably. Results cannot be interpreted as absolute evidence of the presence or absence of malignant disease. Performed At: [facility] [address]  [address] [place], [place] [id number] Rush Farmer MD CC:[id number]   . Kappa free light chain 07/10/2018 24.4* 3.3 - 19.4 mg/L Final  . Lamda free light chains 07/10/2018 17.0  5.7 - 26.3 mg/L Final  . Kappa, lamda light chain ratio 07/10/2018 1.44  0.26 - 1.65 Final   Comment: (NOTE) Performed At: [facility] [place], [place] [id number] Rush Farmer MD [id number]       Pathology Orders Placed This Encounter  Procedures  . CT CHEST W CONTRAST    Standing Status:   Future    Standing Expiration Date:   07/17/2019    Order Specific Question:   If indicated for the ordered procedure, I authorize the administration of contrast media per Radiology protocol    Answer:   Yes    Order Specific Question:   Preferred imaging location?    Answer:   [facility]    Order Specific Question:   Radiology Contrast Protocol - do NOT remove file path    Answer:   \\[email or web address]  . CT Abdomen Pelvis W Contrast    Standing Status:   Future    Standing Expiration Date:   07/17/2019    Order Specific Question:   If indicated for the ordered procedure, I authorize the administration of contrast media per Radiology protocol    Answer:   Yes    Order Specific Question:   Preferred imaging location?    Answer:   [facility]    Order Specific Question:   Is Oral Contrast requested for this exam?    Answer:   Yes, Per Radiology protocol    Order Specific Question:   Radiology Contrast Protocol - do NOT remove file path    Answer:   \\[email or web address]  . CBC with Differential/Platelet    Standing Status:   Future    Standing Expiration Date:   07/18/2019  . Comprehensive metabolic panel    Standing Status:   Future    Standing Expiration Date:   07/18/2019  . Lactate dehydrogenase    Standing Status:   Future    Standing Expiration Date:   07/18/2019  . Ferritin    Standing Status:   Future    Standing Expiration Date:   07/18/2019  . Hepatitis panel, acute    Standing Status:   Future    Standing Expiration Date:   07/18/2019  . Hepatitis B surface antibody    Standing Status:   Future    Standing Expiration Date:   07/18/2019  . Hepatitis B surface antigen    Standing Status:   Future    Standing Expiration Date:   07/18/2019  . Hepatitis B core antibody, total    Standing Status:   Future     Standing Expiration Date:   07/18/2019  . Hepatitis C RNA quantitative    Standing Status:   Future    Standing Expiration Date:   07/18/2019  . HIV antibody (with reflex)    Standing Status:   Future    Standing Expiration Date:   07/18/2019       Zoila Shutter MD

## 2018-07-17 NOTE — Patient Instructions (Signed)
[place] at [facility]  Discharge Instructions:  You were seen  By dr. Walden Field _______________________________________________________________  Thank you for choosing [place] at [facility] to provide your oncology and hematology care.  To afford each patient quality time with our providers, please arrive at least 15 minutes before your scheduled appointment.  You need to re-schedule your appointment if you arrive 10 or more minutes late.  We strive to give you quality time with our providers, and arriving late affects you and other patients whose appointments are after yours.  Also, if you no show three or more times for appointments you may be dismissed from the clinic.  Again, thank you for choosing [place] at [place] hope is that these requests will allow you access to exceptional care and in a timely manner. _______________________________________________________________  If you have questions after your visit, please contact our office at [phone] between the hours of 8:30 a.m. and 5:00 p.m. Voicemails left after 4:30 p.m. will not be returned until the following business day. _______________________________________________________________  For prescription refill requests, have your pharmacy contact our office. _______________________________________________________________  Recommendations made by the consultant and any test results will be sent to your referring physician. _______________________________________________________________

## 2018-07-21 DIAGNOSIS — I10 Essential (primary) hypertension: Secondary | ICD-10-CM | POA: Diagnosis not present

## 2018-07-21 DIAGNOSIS — Z23 Encounter for immunization: Secondary | ICD-10-CM | POA: Diagnosis not present

## 2018-07-21 DIAGNOSIS — I2510 Atherosclerotic heart disease of native coronary artery without angina pectoris: Secondary | ICD-10-CM | POA: Diagnosis not present

## 2018-07-21 DIAGNOSIS — D509 Iron deficiency anemia, unspecified: Secondary | ICD-10-CM | POA: Diagnosis not present

## 2018-07-21 DIAGNOSIS — K210 Gastro-esophageal reflux disease with esophagitis: Secondary | ICD-10-CM | POA: Diagnosis not present

## 2018-08-01 ENCOUNTER — Encounter (HOSPITAL_COMMUNITY): Payer: Self-pay | Admitting: Emergency Medicine

## 2018-08-01 ENCOUNTER — Other Ambulatory Visit: Payer: Self-pay

## 2018-08-01 ENCOUNTER — Emergency Department (HOSPITAL_COMMUNITY)
Admission: EM | Admit: 2018-08-01 | Discharge: 2018-08-01 | Disposition: A | Discharge status: Home / Self Care | Payer: PPO | Attending: Emergency Medicine | Admitting: Emergency Medicine

## 2018-08-01 ENCOUNTER — Emergency Department (HOSPITAL_COMMUNITY): Payer: PPO

## 2018-08-01 DIAGNOSIS — J439 Emphysema, unspecified: Secondary | ICD-10-CM | POA: Diagnosis not present

## 2018-08-01 DIAGNOSIS — Z79899 Other long term (current) drug therapy: Secondary | ICD-10-CM | POA: Diagnosis not present

## 2018-08-01 DIAGNOSIS — I10 Essential (primary) hypertension: Secondary | ICD-10-CM | POA: Diagnosis not present

## 2018-08-01 DIAGNOSIS — R319 Hematuria, unspecified: Secondary | ICD-10-CM | POA: Diagnosis present

## 2018-08-01 DIAGNOSIS — Z87891 Personal history of nicotine dependence: Secondary | ICD-10-CM | POA: Diagnosis not present

## 2018-08-01 DIAGNOSIS — R310 Gross hematuria: Secondary | ICD-10-CM | POA: Diagnosis not present

## 2018-08-01 DIAGNOSIS — E041 Nontoxic single thyroid nodule: Secondary | ICD-10-CM | POA: Diagnosis not present

## 2018-08-01 DIAGNOSIS — Z7982 Long term (current) use of aspirin: Secondary | ICD-10-CM | POA: Diagnosis not present

## 2018-08-01 DIAGNOSIS — I714 Abdominal aortic aneurysm, without rupture, unspecified: Secondary | ICD-10-CM

## 2018-08-01 DIAGNOSIS — N3001 Acute cystitis with hematuria: Secondary | ICD-10-CM | POA: Diagnosis not present

## 2018-08-01 DIAGNOSIS — D649 Anemia, unspecified: Secondary | ICD-10-CM | POA: Insufficient documentation

## 2018-08-01 LAB — URINALYSIS, ROUTINE W REFLEX MICROSCOPIC
BILIRUBIN URINE: NEGATIVE
GLUCOSE, UA: NEGATIVE mg/dL
Hgb urine dipstick: NEGATIVE
KETONES UR: NEGATIVE mg/dL
NITRITE: POSITIVE — AB
PROTEIN: NEGATIVE mg/dL
Specific Gravity, Urine: 1.017 (ref 1.005–1.030)
pH: 5 (ref 5.0–8.0)

## 2018-08-01 LAB — BASIC METABOLIC PANEL
Anion gap: 5 (ref 5–15)
BUN: 19 mg/dL (ref 8–23)
CALCIUM: 9.1 mg/dL (ref 8.9–10.3)
CHLORIDE: 109 mmol/L (ref 98–111)
CO2: 23 mmol/L (ref 22–32)
CREATININE: 1.21 mg/dL (ref 0.61–1.24)
GFR, EST NON AFRICAN AMERICAN: 55 mL/min — AB (ref >60)
Glucose, Bld: 110 mg/dL — ABNORMAL HIGH (ref 70–99)
Potassium: 3.9 mmol/L (ref 3.5–5.1)
SODIUM: 137 mmol/L (ref 135–145)

## 2018-08-01 LAB — CBC
HCT: 26.4 % — ABNORMAL LOW (ref 39.0–52.0)
HEMOGLOBIN: 7.1 g/dL — AB (ref 13.0–17.0)
MCH: 19.2 pg — ABNORMAL LOW (ref 26.0–34.0)
MCHC: 26.9 g/dL — AB (ref 30.0–36.0)
MCV: 71.4 fL — ABNORMAL LOW (ref 78.0–100.0)
PLATELETS: 382 10*3/uL (ref 150–400)
RBC: 3.7 MIL/uL — ABNORMAL LOW (ref 4.22–5.81)
RDW: 24 % — ABNORMAL HIGH (ref 11.5–15.5)
WBC: 7.3 10*3/uL (ref 4.0–10.5)

## 2018-08-01 MED ORDER — SODIUM CHLORIDE 0.9 % IV SOLN
1.0000 g | Freq: Once | INTRAVENOUS | Status: AC
  Administered 2018-08-01: 1 g via INTRAVENOUS
  Filled 2018-08-01: qty 10

## 2018-08-01 MED ORDER — CEPHALEXIN 500 MG PO CAPS: 500.0000 mg | ORAL_CAPSULE | Freq: Four times a day (QID) | ORAL | 0 refills | Status: DC

## 2018-08-01 MED ORDER — IOPAMIDOL (ISOVUE-300) INJECTION 61%
100.0000 mL | Freq: Once | INTRAVENOUS | Status: AC | PRN
  Administered 2018-08-01: 100 mL via INTRAVENOUS

## 2018-08-01 MED ORDER — SODIUM CHLORIDE 0.9 % IV BOLUS
1000.0000 mL | Freq: Once | INTRAVENOUS | Status: AC
  Administered 2018-08-01: 1000 mL via INTRAVENOUS

## 2018-08-01 MED ORDER — FERROUS SULFATE 325 (65 FE) MG PO TABS: 325.0000 mg | ORAL_TABLET | Freq: Every day | ORAL | 0 refills | Status: DC

## 2018-08-01 MED ORDER — IOPAMIDOL (ISOVUE-300) INJECTION 61%: 100.0000 mL | Freq: Once | INTRAVENOUS | Status: DC | PRN

## 2018-08-01 NOTE — ED Triage Notes (Addendum)
Patient c/o hematuria that started yesterday. Per patient bright red blood. Denies any pain, fevers, nausea, or vomiting. Denies taking any type of anticoagulants. Patient is being treated for anemia and has had to have blood transfusions, last was July 24. Patient also c/o left neck pain that has progressively gotten worse. Patient started having pain when he had EJ placed in February.

## 2018-08-01 NOTE — ED Provider Notes (Signed)
[facility] Provider Note   CSN: [id number] Arrival date & time: 08/01/18  1312     History   Chief Complaint Chief Complaint  Patient presents with  . Hematuria    HPI Jared Schmidt is a 82 y.o. male.  Pt presents to the ED today with hematuria and foul smelling urine.  Pt said he saw about a tbsp of blood in his urine yesterday.  Pt said he had blood come out through his urine 1 time at the grocery store recently.  He said it got all over.   He said he is getting worked up for anemia as well.  He just recently had a colonoscopy/endoscopy and is scheduled for a CT chest/abd/pelvis on Tuesday, October 1.  The pt also c/o left sided neck pain since a dialysis catheter was placed there in February.       Past Medical History:  Diagnosis Date  . Anemia   . GERD (gastroesophageal reflux disease)   . Hypercholesteremia   . Hypertension   . Iron deficiency anemia due to chronic blood loss 03/17/2018  . Vitamin B deficiency    patient denies    Patient Active Problem List   Diagnosis Date Noted  . Iron deficiency anemia due to chronic blood loss 03/17/2018  . Open wound of penis 01/21/2018  . Symptomatic anemia 01/20/2018  . Hypertension 01/20/2018  . Hypercholesteremia 01/20/2018  . GERD (gastroesophageal reflux disease) 01/20/2018  . Vitamin B deficiency 01/20/2018    Past Surgical History:  Procedure Laterality Date  . BIOPSY  06/24/2018   Procedure: BIOPSY;  Surgeon: Daneil Dolin, MD;  [facility];  Service: Endoscopy;;  gastric   . COLONOSCOPY N/A 06/24/2018   Procedure: COLONOSCOPY;  Surgeon: Daneil Dolin, MD;  [facility];  Service: Endoscopy;  Laterality: N/A;  2:15pm  . ESOPHAGOGASTRODUODENOSCOPY N/A 06/24/2018   Procedure: ESOPHAGOGASTRODUODENOSCOPY (EGD);  Surgeon: Daneil Dolin, MD;  [facility];  Service: Endoscopy;  Laterality: N/A;  . none    . POLYPECTOMY  06/24/2018   Procedure:  POLYPECTOMY;  Surgeon: Daneil Dolin, MD;  [facility];  Service: Endoscopy;;  sigmoid polyp hs        Home Medications    Prior to Admission medications   Medication Sig Start Date End Date Taking? Authorizing Provider  amLODipine (NORVASC) 5 MG tablet Take 5 mg by mouth daily.   Yes [provider]  aspirin EC 81 MG tablet Take 81 mg by mouth daily.    Yes [provider]  atorvastatin (LIPITOR) 10 MG tablet Take 10 mg by mouth daily with supper.    Yes [provider]  carvedilol (COREG) 12.5 MG tablet Take 12.5 mg by mouth 2 (two) times daily with a meal.   Yes [provider]  multivitamin-iron-minerals-folic acid (CENTRUM) chewable tablet Chew 1 tablet by mouth daily.   Yes [provider]  pantoprazole (PROTONIX) 40 MG tablet Take 40 mg by mouth daily.    Yes [provider]  triamcinolone cream (KENALOG) 0.1 % Apply 1 application topically daily as needed (for rash).   Yes [provider]  vitamin C (ASCORBIC ACID) 500 MG tablet Take 500 mg by mouth daily.   Yes [provider]  vitamin E 100 UNIT capsule Take 100 Units by mouth daily.    Yes [provider]  cephALEXin (KEFLEX) 500 MG capsule Take 1 capsule (500 mg total) by mouth  4 (four) times daily. 08/01/18   Isla Pence, MD  ferrous sulfate 325 (65 FE) MG tablet Take 1 tablet (325 mg total) by mouth daily. 08/01/18   Isla Pence, MD    Family History Family History  Problem Relation Age of Onset  . Colon cancer Neg Hx     Social History Social History   Tobacco Use  . Smoking status: Former Smoker    Packs/day: 0.50    Years: 67.00    Pack years: 33.50    Types: Cigarettes    Last attempt to quit: 12/11/2017    Years since quitting: 0.6  . Smokeless tobacco: Never Used  Substance Use Topics  . Alcohol use: No    Frequency: Never  . Drug use: No     Allergies   Patient has no known allergies.   Review of  Systems Review of Systems  Genitourinary: Positive for dysuria and hematuria.  All other systems reviewed and are negative.    Physical Exam Updated Vital Signs BP (!) 156/70   Pulse 79   Temp 98 F (36.7 C) (Oral)   Resp 16   Ht _0  (1.702 m)   Wt 64.4 kg   SpO2 100%   BMI 22.24 kg/m   Physical Exam  Constitutional: He is oriented to person, place, and time. He appears well-developed and well-nourished.  HENT:  Head: Normocephalic and atraumatic.  Right Ear: External ear normal.  Left Ear: External ear normal.  Nose: Nose normal.  Mouth/Throat: Oropharynx is clear and moist.  Eyes: Pupils are equal, round, and reactive to light. Conjunctivae and EOM are normal.  Neck: Normal range of motion. Neck supple.  Cardiovascular: Normal rate, regular rhythm, normal heart sounds and intact distal pulses.  Pulmonary/Chest: Effort normal and breath sounds normal.  Abdominal: Soft. Bowel sounds are normal.  Genitourinary: Uncircumcised.  Genitourinary Comments: Partial phimosis.  Pt said he has not been able to pull his foreskin all the way back since he had a foley catheter in February.  Musculoskeletal: Normal range of motion.  Neurological: He is alert and oriented to person, place, and time.  Skin: Skin is warm. Capillary refill takes less than 2 seconds.  Psychiatric: He has a normal mood and affect. His behavior is normal. Judgment and thought content normal.  Nursing note and vitals reviewed.    ED Treatments / Results  Labs (all labs ordered are listed, but only abnormal results are displayed) Labs Reviewed  URINALYSIS, ROUTINE W REFLEX MICROSCOPIC - Abnormal; Notable for the following components:      Result Value   APPearance HAZY (*)    Nitrite POSITIVE (*)    Leukocytes, UA LARGE (*)    WBC, UA >50 (*)    Bacteria, UA MANY (*)    All other components within normal limits  CBC - Abnormal; Notable for the following components:   RBC 3.70 (*)    Hemoglobin 7.1  (*)    HCT 26.4 (*)    MCV 71.4 (*)    MCH 19.2 (*)    MCHC 26.9 (*)    RDW 24.0 (*)    All other components within normal limits  BASIC METABOLIC PANEL - Abnormal; Notable for the following components:   Glucose, Bld 110 (*)    GFR calc non Af Amer 55 (*)    All other components within normal limits  URINE CULTURE    EKG None  Radiology Ct Soft Tissue Neck W Contrast  Result Date: 08/01/2018  CLINICAL DATA:  Gross hematuria, neck pain.  Shortness of breath. EXAM: CT OF THE NECK WITH CONTRAST CT OF THE CHEST WITH CONTRAST CT OF THE ABDOMEN AND PELVIS WITH CONTRAST TECHNIQUE: Multidetector CT imaging of the neck was performed with intravenous contrast.; Multidetector CT imaging of the abdomen and pelvis was performed following the standard protocol during bolus administration of intravenous contrast.; Multidetector CT imaging of the chest was performed following the standard protocol during bolus administration of intravenous contrast. CONTRAST:  <See Chart> ISOVUE-300 IOPAMIDOL (ISOVUE-300) INJECTION 61%, 179m ISOVUE-300 IOPAMIDOL (ISOVUE-300) INJECTION 61% COMPARISON:  None. FINDINGS: CT NECK FINDINGS Pharynx and larynx: Normal. No mass or swelling. Salivary glands: No inflammation, mass, or stone. Thyroid: 11 mm left thyroid cyst is noted. Thyroid ultrasound is recommended for further evaluation. Lymph nodes: None enlarged or abnormal density. Vascular: Atherosclerotic calcifications of carotid arteries are noted without significant stenosis. No other significant vascular abnormality is noted. Limited intracranial: Negative. Visualized orbits: Negative. Mastoids and visualized paranasal sinuses: Clear. Skeleton: No acute or aggressive process. Upper chest: See below. Other: None. CT CHEST FINDINGS Cardiovascular: Atherosclerosis of thoracic aorta is noted without aneurysm or dissection. Normal cardiac size. No pericardial effusion is noted. Mild coronary artery calcifications are noted.  Mediastinum/Nodes: Esophagus is unremarkable. No mediastinal adenopathy is noted. Lungs/Pleura: No pneumothorax or pleural effusion is noted. Mild emphysematous disease is noted in both upper lobes. No consolidative process is noted. Musculoskeletal: No chest wall mass or suspicious bone lesions identified. CT ABDOMEN AND PELVIS FINDINGS Hepatobiliary: No focal liver abnormality is seen. No gallstones, gallbladder wall thickening, or biliary dilatation. Pancreas: Unremarkable. No pancreatic ductal dilatation or surrounding inflammatory changes. Spleen: Normal in size without focal abnormality. Adrenals/Urinary Tract: Adrenal glands are unremarkable. Kidneys are normal, without renal calculi, focal lesion, or hydronephrosis. Bladder is unremarkable. Stomach/Bowel: Stomach is within normal limits. Appendix appears normal. No evidence of bowel wall thickening, distention, or inflammatory changes. Stool is noted throughout the colon. Vascular/Lymphatic: 3.4 cm infrarenal abdominal aortic aneurysm is noted. Atherosclerosis of abdominal aorta is noted without dissection. No significant adenopathy is noted. Reproductive: Prostate is unremarkable. Other: No abdominal wall hernia or abnormality. No abdominopelvic ascites. 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 No acute osseous abnormality is noted. IMPRESSION: 11 mm left thyroid nodule is noted. Thyroid ultrasound is recommended for further evaluation. No other significant abnormality seen in the soft tissues of the neck. No acute abnormality is noted in the chest. 3.4 cm infrarenal abdominal aortic aneurysm is noted. Recommend followup by ultrasound in 3 years. This recommendation follows ACR consensus guidelines: White Paper of the ACR Incidental Findings Committee II on Vascular Findings. J Am Coll Radiol 2013; 10:789-794. 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 Aortic Atherosclerosis  (ICD10-I70.0) and Emphysema (ICD10-J43.9). Electronically Signed   By: JMarijo Conception M.D.   On: 08/01/2018 18:18   Ct Chest W Contrast  Result Date: 08/01/2018 CLINICAL DATA:  Gross hematuria, neck pain.  Shortness of breath. EXAM: CT OF THE NECK WITH CONTRAST CT OF THE CHEST WITH CONTRAST CT OF THE ABDOMEN AND PELVIS WITH CONTRAST TECHNIQUE: Multidetector CT imaging of the neck was performed with intravenous contrast.; Multidetector CT imaging of the abdomen and pelvis was performed following the standard protocol during bolus administration of intravenous contrast.; Multidetector CT imaging of the chest was performed following the standard protocol during bolus administration of intravenous contrast. CONTRAST:  <See Chart> ISOVUE-300 IOPAMIDOL (ISOVUE-300) INJECTION 61%, 1033mISOVUE-300 IOPAMIDOL (ISOVUE-300) INJECTION 61%  COMPARISON:  None. FINDINGS: CT NECK FINDINGS Pharynx and larynx: Normal. No mass or swelling. Salivary glands: No inflammation, mass, or stone. Thyroid: 11 mm left thyroid cyst is noted. Thyroid ultrasound is recommended for further evaluation. Lymph nodes: None enlarged or abnormal density. Vascular: Atherosclerotic calcifications of carotid arteries are noted without significant stenosis. No other significant vascular abnormality is noted. Limited intracranial: Negative. Visualized orbits: Negative. Mastoids and visualized paranasal sinuses: Clear. Skeleton: No acute or aggressive process. Upper chest: See below. Other: None. CT CHEST FINDINGS Cardiovascular: Atherosclerosis of thoracic aorta is noted without aneurysm or dissection. Normal cardiac size. No pericardial effusion is noted. Mild coronary artery calcifications are noted. Mediastinum/Nodes: Esophagus is unremarkable. No mediastinal adenopathy is noted. Lungs/Pleura: No pneumothorax or pleural effusion is noted. Mild emphysematous disease is noted in both upper lobes. No consolidative process is noted. Musculoskeletal: No  chest wall mass or suspicious bone lesions identified. CT ABDOMEN AND PELVIS FINDINGS Hepatobiliary: No focal liver abnormality is seen. No gallstones, gallbladder wall thickening, or biliary dilatation. Pancreas: Unremarkable. No pancreatic ductal dilatation or surrounding inflammatory changes. Spleen: Normal in size without focal abnormality. Adrenals/Urinary Tract: Adrenal glands are unremarkable. Kidneys are normal, without renal calculi, focal lesion, or hydronephrosis. Bladder is unremarkable. Stomach/Bowel: Stomach is within normal limits. Appendix appears normal. No evidence of bowel wall thickening, distention, or inflammatory changes. Stool is noted throughout the colon. Vascular/Lymphatic: 3.4 cm infrarenal abdominal aortic aneurysm is noted. Atherosclerosis of abdominal aorta is noted without dissection. No significant adenopathy is noted. Reproductive: Prostate is unremarkable. Other: No abdominal wall hernia or abnormality. No abdominopelvic ascites. 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 No acute osseous abnormality is noted. IMPRESSION: 11 mm left thyroid nodule is noted. Thyroid ultrasound is recommended for further evaluation. No other significant abnormality seen in the soft tissues of the neck. No acute abnormality is noted in the chest. 3.4 cm infrarenal abdominal aortic aneurysm is noted. Recommend followup by ultrasound in 3 years. This recommendation follows ACR consensus guidelines: White Paper of the ACR Incidental Findings Committee II on Vascular Findings. J Am Coll Radiol 2013; 10:789-794. 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 Aortic Atherosclerosis (ICD10-I70.0) and Emphysema (ICD10-J43.9). Electronically Signed   By: Marijo Conception, M.D.   On: 08/01/2018 18:18   Ct Abdomen Pelvis W Contrast  Result Date: 08/01/2018 CLINICAL DATA:  Gross hematuria, neck pain.  Shortness of breath. EXAM: CT OF THE NECK  WITH CONTRAST CT OF THE CHEST WITH CONTRAST CT OF THE ABDOMEN AND PELVIS WITH CONTRAST TECHNIQUE: Multidetector CT imaging of the neck was performed with intravenous contrast.; Multidetector CT imaging of the abdomen and pelvis was performed following the standard protocol during bolus administration of intravenous contrast.; Multidetector CT imaging of the chest was performed following the standard protocol during bolus administration of intravenous contrast. CONTRAST:  <See Chart> ISOVUE-300 IOPAMIDOL (ISOVUE-300) INJECTION 61%, 152m ISOVUE-300 IOPAMIDOL (ISOVUE-300) INJECTION 61% COMPARISON:  None. FINDINGS: CT NECK FINDINGS Pharynx and larynx: Normal. No mass or swelling. Salivary glands: No inflammation, mass, or stone. Thyroid: 11 mm left thyroid cyst is noted. Thyroid ultrasound is recommended for further evaluation. Lymph nodes: None enlarged or abnormal density. Vascular: Atherosclerotic calcifications of carotid arteries are noted without significant stenosis. No other significant vascular abnormality is noted. Limited intracranial: Negative. Visualized orbits: Negative. Mastoids and visualized paranasal sinuses: Clear. Skeleton: No acute or aggressive process. Upper chest: See below. Other: None. CT CHEST FINDINGS Cardiovascular: Atherosclerosis of thoracic aorta is noted  without aneurysm or dissection. Normal cardiac size. No pericardial effusion is noted. Mild coronary artery calcifications are noted. Mediastinum/Nodes: Esophagus is unremarkable. No mediastinal adenopathy is noted. Lungs/Pleura: No pneumothorax or pleural effusion is noted. Mild emphysematous disease is noted in both upper lobes. No consolidative process is noted. Musculoskeletal: No chest wall mass or suspicious bone lesions identified. CT ABDOMEN AND PELVIS FINDINGS Hepatobiliary: No focal liver abnormality is seen. No gallstones, gallbladder wall thickening, or biliary dilatation. Pancreas: Unremarkable. No pancreatic ductal  dilatation or surrounding inflammatory changes. Spleen: Normal in size without focal abnormality. Adrenals/Urinary Tract: Adrenal glands are unremarkable. Kidneys are normal, without renal calculi, focal lesion, or hydronephrosis. Bladder is unremarkable. Stomach/Bowel: Stomach is within normal limits. Appendix appears normal. No evidence of bowel wall thickening, distention, or inflammatory changes. Stool is noted throughout the colon. Vascular/Lymphatic: 3.4 cm infrarenal abdominal aortic aneurysm is noted. Atherosclerosis of abdominal aorta is noted without dissection. No significant adenopathy is noted. Reproductive: Prostate is unremarkable. Other: No abdominal wall hernia or abnormality. No abdominopelvic ascites. 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 No acute osseous abnormality is noted. IMPRESSION: 11 mm left thyroid nodule is noted. Thyroid ultrasound is recommended for further evaluation. No other significant abnormality seen in the soft tissues of the neck. No acute abnormality is noted in the chest. 3.4 cm infrarenal abdominal aortic aneurysm is noted. Recommend followup by ultrasound in 3 years. This recommendation follows ACR consensus guidelines: White Paper of the ACR Incidental Findings Committee II on Vascular Findings. J Am Coll Radiol 2013; 10:789-794. 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 Aortic Atherosclerosis (ICD10-I70.0) and Emphysema (ICD10-J43.9). Electronically Signed   By: Marijo Conception, M.D.   On: 08/01/2018 18:18    Procedures Procedures (including critical care time)  Medications Ordered in ED Medications  iopamidol (ISOVUE-300) 61 % injection 100 mL ( Intravenous Canceled Entry 08/01/18 1700)  sodium chloride 0.9 % bolus 1,000 mL (0 mLs Intravenous Stopped 08/01/18 1804)  cefTRIAXone (ROCEPHIN) 1 g in sodium chloride 0.9 % 100 mL IVPB (0 g Intravenous Stopped 08/01/18 1715)  iopamidol  (ISOVUE-300) 61 % injection 100 mL (100 mLs Intravenous Contrast Given 08/01/18 1701)     Initial Impression / Assessment and Plan / ED Course  I have reviewed the triage vital signs and the nursing notes.  Pertinent labs & imaging results that were available during my care of the patient were reviewed by me and considered in my medical decision making (see chart for details).    Pt does have a thyroid nodule, so an outpatient [place] was ordered.  Pt also has a 3.4 cm AAA which will need to get a repeat US in 3 years.   Pt is feeling better and is ready to go home.  Anemia has been chronic and is followed by Dr. Walden Field who is planning on a bone marrow biopsy and a capsule endoscopy.  I will start him on oral iron to see if that helps.  Pt will be put on keflex for UTI.  Urine has been sent for cx.  Pt is stable for d/c.  Return if worse.    Final Clinical Impressions(s) / ED Diagnoses   Final diagnoses:  Acute cystitis with hematuria  Anemia, unspecified type  Thyroid nodule  Abdominal aortic aneurysm (AAA), 30-34 mm diameter [facility])    ED Discharge Orders         Ordered    US THYROID     08/01/18 1859  cephALEXin (KEFLEX) 500 MG capsule  4 times daily     08/01/18 1859    ferrous sulfate 325 (65 FE) MG tablet  Daily     08/01/18 1900           Isla Pence, MD 08/01/18 [phone]

## 2018-08-01 NOTE — Discharge Instructions (Signed)
AAA

## 2018-08-04 ENCOUNTER — Other Ambulatory Visit (HOSPITAL_COMMUNITY): Payer: Self-pay | Admitting: Nurse Practitioner

## 2018-08-04 ENCOUNTER — Ambulatory Visit (HOSPITAL_COMMUNITY): Payer: PPO

## 2018-08-04 ENCOUNTER — Inpatient Hospital Stay (HOSPITAL_COMMUNITY): Payer: PPO | Attending: Hematology

## 2018-08-04 DIAGNOSIS — D508 Other iron deficiency anemias: Secondary | ICD-10-CM

## 2018-08-04 DIAGNOSIS — I714 Abdominal aortic aneurysm, without rupture: Secondary | ICD-10-CM | POA: Insufficient documentation

## 2018-08-04 DIAGNOSIS — D472 Monoclonal gammopathy: Secondary | ICD-10-CM | POA: Diagnosis not present

## 2018-08-04 DIAGNOSIS — R05 Cough: Secondary | ICD-10-CM

## 2018-08-04 DIAGNOSIS — Z720 Tobacco use: Secondary | ICD-10-CM

## 2018-08-04 DIAGNOSIS — D509 Iron deficiency anemia, unspecified: Secondary | ICD-10-CM | POA: Diagnosis not present

## 2018-08-04 DIAGNOSIS — I10 Essential (primary) hypertension: Secondary | ICD-10-CM | POA: Insufficient documentation

## 2018-08-04 DIAGNOSIS — E041 Nontoxic single thyroid nodule: Secondary | ICD-10-CM | POA: Diagnosis not present

## 2018-08-04 DIAGNOSIS — R059 Cough, unspecified: Secondary | ICD-10-CM

## 2018-08-04 DIAGNOSIS — D649 Anemia, unspecified: Secondary | ICD-10-CM

## 2018-08-04 LAB — URINE CULTURE: Culture: >=100000 — AB

## 2018-08-04 LAB — COMPREHENSIVE METABOLIC PANEL
ALBUMIN: 3.6 g/dL (ref 3.5–5.0)
ALK PHOS: 88 U/L (ref 38–126)
ALT: 12 U/L (ref 0–44)
ANION GAP: 7 (ref 5–15)
AST: 20 U/L (ref 15–41)
BUN: 11 mg/dL (ref 8–23)
CALCIUM: 8.9 mg/dL (ref 8.9–10.3)
CO2: 23 mmol/L (ref 22–32)
CREATININE: 1.15 mg/dL (ref 0.61–1.24)
Chloride: 111 mmol/L (ref 98–111)
GFR calc Af Amer: >60 mL/min (ref >60)
GFR calc non Af Amer: 58 mL/min — ABNORMAL LOW (ref >60)
GLUCOSE: 99 mg/dL (ref 70–99)
Potassium: 4.1 mmol/L (ref 3.5–5.1)
SODIUM: 141 mmol/L (ref 135–145)
Total Bilirubin: 0.6 mg/dL (ref 0.3–1.2)
Total Protein: 6.8 g/dL (ref 6.5–8.1)

## 2018-08-04 LAB — CBC WITH DIFFERENTIAL/PLATELET
BASOS ABS: 0.1 10*3/uL (ref 0.0–0.1)
Basophils Relative: 1 %
EOS ABS: 0.2 10*3/uL (ref 0.0–0.7)
Eosinophils Relative: 3 %
HCT: 25.3 % — ABNORMAL LOW (ref 39.0–52.0)
HEMOGLOBIN: 6.8 g/dL — AB (ref 13.0–17.0)
LYMPHS PCT: 19 %
Lymphs Abs: 1 10*3/uL (ref 0.7–4.0)
MCH: 19 pg — ABNORMAL LOW (ref 26.0–34.0)
MCHC: 26.9 g/dL — ABNORMAL LOW (ref 30.0–36.0)
MCV: 70.9 fL — ABNORMAL LOW (ref 78.0–100.0)
MONO ABS: 0.7 10*3/uL (ref 0.1–1.0)
Monocytes Relative: 14 %
Neutro Abs: 3 10*3/uL (ref 1.7–7.7)
Neutrophils Relative %: 63 %
PLATELETS: 363 10*3/uL (ref 150–400)
RBC: 3.57 MIL/uL — AB (ref 4.22–5.81)
RDW: 24.7 % — ABNORMAL HIGH (ref 11.5–15.5)
WBC: 5 10*3/uL (ref 4.0–10.5)

## 2018-08-04 LAB — LACTATE DEHYDROGENASE: LDH: 141 U/L (ref 98–192)

## 2018-08-04 LAB — FERRITIN: FERRITIN: 21 ng/mL — AB (ref 24–336)

## 2018-08-04 LAB — PREPARE RBC (CROSSMATCH)

## 2018-08-04 NOTE — Addendum Note (Signed)
Addended by: Farley Ly on: 08/04/2018 03:49 PM   Modules accepted: Orders, SmartSet

## 2018-08-04 NOTE — Progress Notes (Signed)
CRITICAL VALUE ALERT Critical value received: Hgb 6.8 Date of notification:  08-04-18 Time of notification: 5573 Critical value read back:  Yes.   Nurse who received alert:  Charlyne Petrin  MD notified (1st page):  Dr. Delton Coombes

## 2018-08-05 ENCOUNTER — Telehealth: Payer: Self-pay | Admitting: *Deleted

## 2018-08-05 ENCOUNTER — Inpatient Hospital Stay (HOSPITAL_COMMUNITY): Payer: PPO

## 2018-08-05 ENCOUNTER — Encounter (HOSPITAL_COMMUNITY): Payer: Self-pay

## 2018-08-05 DIAGNOSIS — D509 Iron deficiency anemia, unspecified: Secondary | ICD-10-CM | POA: Diagnosis not present

## 2018-08-05 DIAGNOSIS — D649 Anemia, unspecified: Secondary | ICD-10-CM

## 2018-08-05 LAB — HIV ANTIBODY (ROUTINE TESTING W REFLEX): HIV SCREEN 4TH GENERATION: NONREACTIVE

## 2018-08-05 LAB — HEPATITIS B SURFACE ANTIBODY,QUALITATIVE: Hep B S Ab: NONREACTIVE

## 2018-08-05 LAB — HEPATITIS B SURFACE ANTIGEN: Hepatitis B Surface Ag: NEGATIVE

## 2018-08-05 LAB — HEPATITIS B CORE ANTIBODY, TOTAL: Hep B Core Total Ab: NEGATIVE

## 2018-08-05 LAB — HEPATITIS PANEL, ACUTE
HCV Ab: <0.1 s/co ratio (ref 0.0–0.9)
HEP B S AG: NEGATIVE
Hep A IgM: NEGATIVE
Hep B C IgM: NEGATIVE

## 2018-08-05 MED ORDER — SODIUM CHLORIDE 0.9% IV SOLUTION
250.0000 mL | Freq: Once | INTRAVENOUS | Status: AC
  Administered 2018-08-05: 250 mL via INTRAVENOUS

## 2018-08-05 MED ORDER — ACETAMINOPHEN 325 MG PO TABS
650.0000 mg | ORAL_TABLET | Freq: Once | ORAL | Status: AC
  Administered 2018-08-05: 650 mg via ORAL

## 2018-08-05 MED ORDER — SODIUM CHLORIDE 0.9% FLUSH: 3.0000 mL | INTRAVENOUS | Status: DC | PRN

## 2018-08-05 MED ORDER — DIPHENHYDRAMINE HCL 25 MG PO CAPS
25.0000 mg | ORAL_CAPSULE | Freq: Once | ORAL | Status: AC
  Administered 2018-08-05: 25 mg via ORAL

## 2018-08-05 MED ORDER — HEPARIN SOD (PORK) LOCK FLUSH 100 UNIT/ML IV SOLN: 250.0000 [IU] | INTRAVENOUS | Status: DC | PRN

## 2018-08-05 MED ORDER — ACETAMINOPHEN 325 MG PO TABS
ORAL_TABLET | ORAL | Status: AC
  Filled 2018-08-05: qty 2

## 2018-08-05 MED ORDER — SODIUM CHLORIDE 0.9% FLUSH: 10.0000 mL | INTRAVENOUS | Status: DC | PRN

## 2018-08-05 MED ORDER — DIPHENHYDRAMINE HCL 25 MG PO CAPS
ORAL_CAPSULE | ORAL | Status: AC
  Filled 2018-08-05: qty 1

## 2018-08-05 MED ORDER — HEPARIN SOD (PORK) LOCK FLUSH 100 UNIT/ML IV SOLN: 500.0000 [IU] | Freq: Every day | INTRAVENOUS | Status: DC | PRN

## 2018-08-05 NOTE — Patient Instructions (Signed)
[place] at [facility]  Discharge Instructions:  You received a blood transfusion today.  _______________________________________________________________  Thank you for choosing [place] at [facility] to provide your oncology and hematology care.  To afford each patient quality time with our providers, please arrive at least 15 minutes before your scheduled appointment.  You need to re-schedule your appointment if you arrive 10 or more minutes late.  We strive to give you quality time with our providers, and arriving late affects you and other patients whose appointments are after yours.  Also, if you no show three or more times for appointments you may be dismissed from the clinic.  Again, thank you for choosing [place] [facility] [place] hope is that these requests will allow you access to exceptional care and in a timely manner. _______________________________________________________________  If you have questions after your visit, please contact our office at [phone] between the hours of 8:30 a.m. and 5:00 p.m. Voicemails left after 4:30 p.m. will not be returned until the following business day. _______________________________________________________________  For prescription refill requests, have your pharmacy contact our office. _______________________________________________________________  Recommendations made by the consultant and any test results will be sent to your referring physician. _______________________________________________________________

## 2018-08-05 NOTE — Progress Notes (Signed)
Patient tolerated blood transfusion with no complaints voiced.  Peripheral IV site clean and dry with no bruising or swelling noted at site.  Band aid applied.  VSs with discharge and left ambulatory with family.

## 2018-08-05 NOTE — Telephone Encounter (Signed)
Post ED Visit - Positive Culture Follow-up  Culture report reviewed by antimicrobial stewardship pharmacist:  []  Elenor Quinones, Pharm.D. []  Heide Guile, Pharm.D., BCPS AQ-ID []  Parks Neptune, Pharm.D., BCPS []  Alycia Rossetti, Pharm.D., BCPS []  Lasker, Pharm.D., BCPS, AAHIVP []  Legrand Como, Pharm.D., BCPS, AAHIVP []  Salome Arnt, PharmD, BCPS []  Johnnette Gourd, PharmD, BCPS []  Hughes Better, PharmD, BCPS []  Leeroy Cha, PharmD Ginger Carne, PharmD  Positive urine culture Treated with Cephalexin, organism sensitive to the same and no further patient follow-up is required at this time.  Harlon Flor [facility] 08/05/2018, 11:50 AM

## 2018-08-06 DIAGNOSIS — D509 Iron deficiency anemia, unspecified: Secondary | ICD-10-CM | POA: Diagnosis not present

## 2018-08-06 DIAGNOSIS — N390 Urinary tract infection, site not specified: Secondary | ICD-10-CM | POA: Diagnosis not present

## 2018-08-06 DIAGNOSIS — I10 Essential (primary) hypertension: Secondary | ICD-10-CM | POA: Diagnosis not present

## 2018-08-06 DIAGNOSIS — I2510 Atherosclerotic heart disease of native coronary artery without angina pectoris: Secondary | ICD-10-CM | POA: Diagnosis not present

## 2018-08-06 LAB — TYPE AND SCREEN
ABO/RH(D): A POS
Antibody Screen: NEGATIVE
UNIT DIVISION: 0
Unit division: 0

## 2018-08-06 LAB — BPAM RBC
BLOOD PRODUCT EXPIRATION DATE: 201910252359
Blood Product Expiration Date: 201910192359
ISSUE DATE / TIME: 201910021345
ISSUE DATE / TIME: 201910021154
UNIT TYPE AND RH: 600
Unit Type and Rh: 600

## 2018-08-13 ENCOUNTER — Encounter: Payer: Self-pay | Admitting: Gastroenterology

## 2018-08-13 ENCOUNTER — Ambulatory Visit (INDEPENDENT_AMBULATORY_CARE_PROVIDER_SITE_OTHER): Payer: PPO | Admitting: Gastroenterology

## 2018-08-13 ENCOUNTER — Other Ambulatory Visit: Payer: Self-pay

## 2018-08-13 ENCOUNTER — Telehealth: Payer: Self-pay

## 2018-08-13 VITALS — BP 125/74 | HR 87 | Temp 96.7°F | Ht 67.0 in | Wt 141.4 lb

## 2018-08-13 DIAGNOSIS — D500 Iron deficiency anemia secondary to blood loss (chronic): Secondary | ICD-10-CM

## 2018-08-13 NOTE — Progress Notes (Signed)
cc'ed to pcp °

## 2018-08-13 NOTE — Patient Instructions (Signed)
Capsule study is planned to evaluate the remaining portion of your GI tract that has not been seen with a scope. We will be able to look at the rest of your small intestines to rule out a source for blood loss.

## 2018-08-13 NOTE — Assessment & Plan Note (Signed)
82 year old gentleman with history of unexplained iron deficiency anemia, presumed occult blood loss.  I FOBT negative in July.  No overt GI bleeding that he recalls.  According to records from [facility], he presented with complaints of diarrhea and rectal bleeding back in February but otherwise patient denies any overt GI bleeding.  EGD and colonoscopy up-to-date.  Nothing to really explain the significant transfusion dependent anemia.  Will complete GI work-up with [facility] capsule endoscopy.  Discussed at length with patient today. I have discussed the risks, alternatives, benefits with regards to but not limited to the risk of reaction to medication, bleeding, infection, perforation, capsule becoming lodged and the patient is agreeable to proceed. Written consent to be obtained.

## 2018-08-13 NOTE — Progress Notes (Signed)
Primary Care Physician: Sinda Du, MD  Primary Gastroenterologist:  Garfield Cornea, MD   Chief Complaint  Patient presents with  . Anemia    HPI: Jared Schmidt is a 82 y.o. male here for follow-up of iron deficiency anemia.  He had an EGD and colonoscopy on June 24, 2018 which showed multiple erosions in the stomach and duodenal bulb, benign gastric biopsies, diverticulosis of the colon, internal hemorrhoids but otherwise unremarkable.    Issues of profound anemia initially noted back in February during hospitalization in [place].  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  He was hospitalized at [facility].  During that hospitalization he was offered colonoscopy but he declined.  He had a TEE without clear source of embolic CVA.  No EGD at that time as family had initially suspected.  Ultimately he agreed to EGD and colonoscopy here locally as outlined above.In July he completed I FOBT which was negative.  Patient continues to follow with Dr. Walden Field, his hematologist.  He continues to require IV iron infusions and blood transfusions.  Received 2 units of packed red blood cells last week for hemoglobin of 6.8.  Ferritin was 21, down from 96 a month ago.  Last iron infusion in August.  He has a history of monoclonal gammopathy.  He underwent CT chest/abdomen/pelvis/neck he has a 3.4 cm infrarenal abdominal aortic aneurysm requiring 3-year follow-up ultrasound, 11 mm left thyroid nodule (ultrasound recommended).  According to hematology, next step would be bone marrow biopsy.  Patient denies any recent melena or rectal bleeding.  Appetite is good.  Weight has been stable.  No abdominal pain.  Some fatigue.  No lightheadedness, dizziness, shortness of breath.  Current  Outpatient Medications  Medication Sig Dispense Refill  . amLODipine (NORVASC) 5 MG tablet Take 5 mg by mouth daily.    Marland Kitchen atorvastatin (LIPITOR) 10 MG tablet Take 10 mg by mouth daily with supper.     . carvedilol (COREG) 12.5 MG tablet Take 12.5 mg by mouth 2 (two) times daily with a meal.    . ferrous sulfate 325 (65 FE) MG tablet Take 1 tablet (325 mg total) by mouth daily. 30 tablet 0  . multivitamin-iron-minerals-folic acid (CENTRUM) chewable tablet Chew 1 tablet by mouth daily.    . pantoprazole (PROTONIX) 40 MG tablet Take 40 mg by mouth daily.     Marland Kitchen triamcinolone cream (KENALOG) 0.1 % Apply 1 application topically daily as needed (for rash).    . vitamin C (ASCORBIC ACID) 500 MG tablet Take 500 mg by mouth daily.    . vitamin E 100 UNIT capsule Take 100 Units by mouth daily.      No current facility-administered medications for this visit.     Allergies as of 08/13/2018  . (No Known Allergies)   Past Medical History:  Diagnosis Date  . Anemia   . GERD (gastroesophageal reflux disease)   . Hypercholesteremia   . Hypertension   . Iron deficiency anemia due to chronic blood loss 03/17/2018  . Vitamin B deficiency    patient denies   Past Surgical History:  Procedure Laterality Date  . BIOPSY  06/24/2018   Procedure: BIOPSY;  Surgeon: Daneil Dolin, MD;  Location: [facility];  Service: Endoscopy;;  gastric   . COLONOSCOPY N/A 06/24/2018  Dr. Gala Romney: Internal hemorrhoids, diverticulosis, 9 mm polyp removed from the sigmoid colon which was hyperplastic.  Marland Kitchen ESOPHAGOGASTRODUODENOSCOPY N/A 06/24/2018   Dr. Gala Romney: Multiple erosions in the stomach and duodenal bulb, gastric biopsies with chronic gastritis with intestinal metaplasia, no H. pylori.  . none    . POLYPECTOMY  06/24/2018   Procedure: POLYPECTOMY;  Surgeon: Daneil Dolin, MD;  Location: [facility];  Service: Endoscopy;;  sigmoid polyp hs   Family History  Problem Relation Age of Onset  . Colon cancer Neg Hx     Social History   Socioeconomic History  . Marital status: Married    Spouse name: Jared Schmidt  . Number of children: Not on file  . Years of education: Not on file  . Highest education level: 8th grade  Occupational History  . Not on file  Social Needs  . Financial resource strain: Not on file  . Food insecurity:    Worry: Not on file    Inability: Not on file  . Transportation needs:    Medical: Not on file    Non-medical: Not on file  Tobacco Use  . Smoking status: Former Smoker    Packs/day: 0.50    Years: 67.00    Pack years: 33.50    Types: Cigarettes    Last attempt to quit: 12/11/2017    Years since quitting: 0.6  . Smokeless tobacco: Never Used  Substance and Sexual Activity  . Alcohol use: No    Frequency: Never  . Drug use: No  . Sexual activity: Not Currently  Lifestyle  . Physical activity:    Days per week: Not on file    Minutes per session: Not on file  . Stress: Not on file  Relationships  . Social connections:    Talks on phone: Not on file    Gets together: Not on file    Attends religious service: Not on file    Active member of club or organization: Not on file    Attends meetings of clubs or organizations: Not on file    Relationship status: Not on file  . Intimate partner violence:    Fear of current or ex partner: Not on file    Emotionally abused: Not on file    Physically abused: Not on file    Forced sexual activity: Not on file  Other Topics Concern  . Not on file  Social History Narrative  . Not on file    ROS:  General: Negative for anorexia, weight loss, fever, chills,  weakness.  See HPI ENT: Negative for hoarseness, difficulty swallowing , nasal congestion. CV: Negative for chest pain, angina, palpitations, dyspnea on exertion, peripheral edema.  Respiratory: Negative for dyspnea at rest, dyspnea on exertion, cough, sputum, wheezing.  GI: See history of present illness. GU:  Negative for dysuria, hematuria, urinary  incontinence, urinary frequency, nocturnal urination.  Endo: Negative for unusual weight change.    Physical Examination:   BP 125/74   Pulse 87   Temp (!) 96.7 F (35.9 C) (Oral)   Ht _0  (1.702 m)   Wt 141 lb 6.4 oz (64.1 kg)   BMI 22.15 kg/m   General: Well-nourished, well-developed in no acute distress.  Eyes: No icterus. Mouth: Oropharyngeal mucosa moist and pink , no lesions erythema or exudate. Lungs: Clear to auscultation bilaterally.  Heart: Regular rate and rhythm, no murmurs rubs or gallops.  Abdomen: Bowel sounds are normal, nontender, nondistended, no hepatosplenomegaly or masses, no abdominal  bruits or hernia , no rebound or guarding.   Extremities: No lower extremity edema. No clubbing or deformities. Neuro: Alert and oriented x 4   Skin: Warm and dry, no jaundice.   Psych: Alert and cooperative, normal mood and affect.  Labs:  Lab Results  Component Value Date   CREATININE 1.15 08/04/2018   BUN 11 08/04/2018   NA 141 08/04/2018   K 4.1 08/04/2018   CL 111 08/04/2018   CO2 23 08/04/2018   Lab Results  Component Value Date   ALT 12 08/04/2018   AST 20 08/04/2018   ALKPHOS 88 08/04/2018   BILITOT 0.6 08/04/2018   Lab Results  Component Value Date   WBC 5.0 08/04/2018   HGB 6.8 (LL) 08/04/2018   HCT 25.3 (L) 08/04/2018   MCV 70.9 (L) 08/04/2018   PLT 363 08/04/2018   Lab Results  Component Value Date   FERRITIN 21 (L) 08/04/2018   Lab Results  Component Value Date   VITAMINB12 848 03/16/2018   Lab Results  Component Value Date   FOLATE 57.9 03/16/2018    Imaging Studies: Ct Soft Tissue Neck W Contrast  Result Date: 08/01/2018 CLINICAL DATA:  Gross hematuria, neck pain.  Shortness of breath. EXAM: CT OF THE NECK WITH CONTRAST CT OF THE CHEST WITH CONTRAST CT OF THE ABDOMEN AND PELVIS WITH CONTRAST TECHNIQUE: Multidetector CT imaging of the neck was performed with intravenous contrast.; Multidetector CT imaging of the abdomen and pelvis  was performed following the standard protocol during bolus administration of intravenous contrast.; Multidetector CT imaging of the chest was performed following the standard protocol during bolus administration of intravenous contrast. CONTRAST:  <See Chart> ISOVUE-300 IOPAMIDOL (ISOVUE-300) INJECTION 61%, 15m ISOVUE-300 IOPAMIDOL (ISOVUE-300) INJECTION 61% COMPARISON:  None. FINDINGS: CT NECK FINDINGS Pharynx and larynx: Normal. No mass or swelling. Salivary glands: No inflammation, mass, or stone. Thyroid: 11 mm left thyroid cyst is noted. Thyroid ultrasound is recommended for further evaluation. Lymph nodes: None enlarged or abnormal density. Vascular: Atherosclerotic calcifications of carotid arteries are noted without significant stenosis. No other significant vascular abnormality is noted. Limited intracranial: Negative. Visualized orbits: Negative. Mastoids and visualized paranasal sinuses: Clear. Skeleton: No acute or aggressive process. Upper chest: See below. Other: None. CT CHEST FINDINGS Cardiovascular: Atherosclerosis of thoracic aorta is noted without aneurysm or dissection. Normal cardiac size. No pericardial effusion is noted. Mild coronary artery calcifications are noted. Mediastinum/Nodes: Esophagus is unremarkable. No mediastinal adenopathy is noted. Lungs/Pleura: No pneumothorax or pleural effusion is noted. Mild emphysematous disease is noted in both upper lobes. No consolidative process is noted. Musculoskeletal: No chest wall mass or suspicious bone lesions identified. CT ABDOMEN AND PELVIS FINDINGS Hepatobiliary: No focal liver abnormality is seen. No gallstones, gallbladder wall thickening, or biliary dilatation. Pancreas: Unremarkable. No pancreatic ductal dilatation or surrounding inflammatory changes. Spleen: Normal in size without focal abnormality. Adrenals/Urinary Tract: Adrenal glands are unremarkable. Kidneys are normal, without renal calculi, focal lesion, or hydronephrosis.  Bladder is unremarkable. Stomach/Bowel: Stomach is within normal limits. Appendix appears normal. No evidence of bowel wall thickening, distention, or inflammatory changes. Stool is noted throughout the colon. Vascular/Lymphatic: 3.4 cm infrarenal abdominal aortic aneurysm is noted. Atherosclerosis of abdominal aorta is noted without dissection. No significant adenopathy is noted. Reproductive: Prostate is unremarkable. Other: No abdominal wall hernia or abnormality. No abdominopelvic ascites. 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 No acute osseous abnormality is noted. IMPRESSION: 11 mm left thyroid nodule is noted. Thyroid ultrasound is  recommended for further evaluation. No other significant abnormality seen in the soft tissues of the neck. No acute abnormality is noted in the chest. 3.4 cm infrarenal abdominal aortic aneurysm is noted. Recommend followup by ultrasound in 3 years. This recommendation follows ACR consensus guidelines: White Paper of the ACR Incidental Findings Committee II on Vascular Findings. J Am Coll Radiol 2013; 10:789-794. 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 Aortic Atherosclerosis (ICD10-I70.0) and Emphysema (ICD10-J43.9). Electronically Signed   By: Marijo Conception, M.D.   On: 08/01/2018 18:18   Ct Chest W Contrast  Result Date: 08/01/2018 CLINICAL DATA:  Gross hematuria, neck pain.  Shortness of breath. EXAM: CT OF THE NECK WITH CONTRAST CT OF THE CHEST WITH CONTRAST CT OF THE ABDOMEN AND PELVIS WITH CONTRAST TECHNIQUE: Multidetector CT imaging of the neck was performed with intravenous contrast.; Multidetector CT imaging of the abdomen and pelvis was performed following the standard protocol during bolus administration of intravenous contrast.; Multidetector CT imaging of the chest was performed following the standard protocol during bolus administration of intravenous contrast. CONTRAST:  <See Chart>  ISOVUE-300 IOPAMIDOL (ISOVUE-300) INJECTION 61%, 142m ISOVUE-300 IOPAMIDOL (ISOVUE-300) INJECTION 61% COMPARISON:  None. FINDINGS: CT NECK FINDINGS Pharynx and larynx: Normal. No mass or swelling. Salivary glands: No inflammation, mass, or stone. Thyroid: 11 mm left thyroid cyst is noted. Thyroid ultrasound is recommended for further evaluation. Lymph nodes: None enlarged or abnormal density. Vascular: Atherosclerotic calcifications of carotid arteries are noted without significant stenosis. No other significant vascular abnormality is noted. Limited intracranial: Negative. Visualized orbits: Negative. Mastoids and visualized paranasal sinuses: Clear. Skeleton: No acute or aggressive process. Upper chest: See below. Other: None. CT CHEST FINDINGS Cardiovascular: Atherosclerosis of thoracic aorta is noted without aneurysm or dissection. Normal cardiac size. No pericardial effusion is noted. Mild coronary artery calcifications are noted. Mediastinum/Nodes: Esophagus is unremarkable. No mediastinal adenopathy is noted. Lungs/Pleura: No pneumothorax or pleural effusion is noted. Mild emphysematous disease is noted in both upper lobes. No consolidative process is noted. Musculoskeletal: No chest wall mass or suspicious bone lesions identified. CT ABDOMEN AND PELVIS FINDINGS Hepatobiliary: No focal liver abnormality is seen. No gallstones, gallbladder wall thickening, or biliary dilatation. Pancreas: Unremarkable. No pancreatic ductal dilatation or surrounding inflammatory changes. Spleen: Normal in size without focal abnormality. Adrenals/Urinary Tract: Adrenal glands are unremarkable. Kidneys are normal, without renal calculi, focal lesion, or hydronephrosis. Bladder is unremarkable. Stomach/Bowel: Stomach is within normal limits. Appendix appears normal. No evidence of bowel wall thickening, distention, or inflammatory changes. Stool is noted throughout the colon. Vascular/Lymphatic: 3.4 cm infrarenal abdominal aortic  aneurysm is noted. Atherosclerosis of abdominal aorta is noted without dissection. No significant adenopathy is noted. Reproductive: Prostate is unremarkable. Other: No abdominal wall hernia or abnormality. No abdominopelvic ascites. 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 No acute osseous abnormality is noted. IMPRESSION: 11 mm left thyroid nodule is noted. Thyroid ultrasound is recommended for further evaluation. No other significant abnormality seen in the soft tissues of the neck. No acute abnormality is noted in the chest. 3.4 cm infrarenal abdominal aortic aneurysm is noted. Recommend followup by ultrasound in 3 years. This recommendation follows ACR consensus guidelines: White Paper of the ACR Incidental Findings Committee II on Vascular Findings. J Am Coll Radiol 2013; 10:789-794. 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 Aortic Atherosclerosis (ICD10-I70.0) and Emphysema (ICD10-J43.9). Electronically Signed   By: JMarijo Conception M.D.   On: 08/01/2018 18:18   Ct  Abdomen Pelvis W Contrast  Result Date: 08/01/2018 CLINICAL DATA:  Gross hematuria, neck pain.  Shortness of breath. EXAM: CT OF THE NECK WITH CONTRAST CT OF THE CHEST WITH CONTRAST CT OF THE ABDOMEN AND PELVIS WITH CONTRAST TECHNIQUE: Multidetector CT imaging of the neck was performed with intravenous contrast.; Multidetector CT imaging of the abdomen and pelvis was performed following the standard protocol during bolus administration of intravenous contrast.; Multidetector CT imaging of the chest was performed following the standard protocol during bolus administration of intravenous contrast. CONTRAST:  <See Chart> ISOVUE-300 IOPAMIDOL (ISOVUE-300) INJECTION 61%, 15m ISOVUE-300 IOPAMIDOL (ISOVUE-300) INJECTION 61% COMPARISON:  None. FINDINGS: CT NECK FINDINGS Pharynx and larynx: Normal. No mass or swelling. Salivary glands: No inflammation, mass, or stone. Thyroid: 11  mm left thyroid cyst is noted. Thyroid ultrasound is recommended for further evaluation. Lymph nodes: None enlarged or abnormal density. Vascular: Atherosclerotic calcifications of carotid arteries are noted without significant stenosis. No other significant vascular abnormality is noted. Limited intracranial: Negative. Visualized orbits: Negative. Mastoids and visualized paranasal sinuses: Clear. Skeleton: No acute or aggressive process. Upper chest: See below. Other: None. CT CHEST FINDINGS Cardiovascular: Atherosclerosis of thoracic aorta is noted without aneurysm or dissection. Normal cardiac size. No pericardial effusion is noted. Mild coronary artery calcifications are noted. Mediastinum/Nodes: Esophagus is unremarkable. No mediastinal adenopathy is noted. Lungs/Pleura: No pneumothorax or pleural effusion is noted. Mild emphysematous disease is noted in both upper lobes. No consolidative process is noted. Musculoskeletal: No chest wall mass or suspicious bone lesions identified. CT ABDOMEN AND PELVIS FINDINGS Hepatobiliary: No focal liver abnormality is seen. No gallstones, gallbladder wall thickening, or biliary dilatation. Pancreas: Unremarkable. No pancreatic ductal dilatation or surrounding inflammatory changes. Spleen: Normal in size without focal abnormality. Adrenals/Urinary Tract: Adrenal glands are unremarkable. Kidneys are normal, without renal calculi, focal lesion, or hydronephrosis. Bladder is unremarkable. Stomach/Bowel: Stomach is within normal limits. Appendix appears normal. No evidence of bowel wall thickening, distention, or inflammatory changes. Stool is noted throughout the colon. Vascular/Lymphatic: 3.4 cm infrarenal abdominal aortic aneurysm is noted. Atherosclerosis of abdominal aorta is noted without dissection. No significant adenopathy is noted. Reproductive: Prostate is unremarkable. Other: No abdominal wall hernia or abnormality. No abdominopelvic ascites. Musculoskeletal:  Multilevel degenerative disc disease is noted in lower lumbar spine. No acute osseous abnormality is noted. IMPRESSION: 11 mm left thyroid nodule is noted. Thyroid ultrasound is recommended for further evaluation. No other significant abnormality seen in the soft tissues of the neck. No acute abnormality is noted in the chest. 3.4 cm infrarenal abdominal aortic aneurysm is noted. Recommend followup by ultrasound in 3 years. This recommendation follows ACR consensus guidelines: White Paper of the ACR Incidental Findings Committee II on Vascular Findings. J Am Coll Radiol 2013; 10:789-794. 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 Aortic Atherosclerosis (ICD10-I70.0) and Emphysema (ICD10-J43.9). Electronically Signed   By: JMarijo Conception M.D.   On: 08/01/2018 18:18

## 2018-08-13 NOTE — Telephone Encounter (Signed)
PA for [employer] Capsule Study submitted via [employer]. Case suspended. Outpatient authorization# [id number], service dates 08/13/18-11/11/18.

## 2018-08-17 ENCOUNTER — Other Ambulatory Visit (HOSPITAL_COMMUNITY): Payer: Self-pay | Admitting: *Deleted

## 2018-08-17 DIAGNOSIS — D500 Iron deficiency anemia secondary to blood loss (chronic): Secondary | ICD-10-CM

## 2018-08-17 NOTE — Telephone Encounter (Signed)
Received fax from [facility]. [employer] approved. PA# [id number], 08/13/18-11/11/18.

## 2018-08-18 ENCOUNTER — Inpatient Hospital Stay (HOSPITAL_BASED_OUTPATIENT_CLINIC_OR_DEPARTMENT_OTHER): Payer: PPO | Admitting: Internal Medicine

## 2018-08-18 ENCOUNTER — Encounter (HOSPITAL_COMMUNITY): Payer: Self-pay | Admitting: Internal Medicine

## 2018-08-18 ENCOUNTER — Inpatient Hospital Stay (HOSPITAL_COMMUNITY): Payer: PPO

## 2018-08-18 VITALS — BP 141/73 | HR 94 | Temp 98.2°F | Resp 20 | Wt 144.1 lb

## 2018-08-18 DIAGNOSIS — E041 Nontoxic single thyroid nodule: Secondary | ICD-10-CM | POA: Diagnosis not present

## 2018-08-18 DIAGNOSIS — D472 Monoclonal gammopathy: Secondary | ICD-10-CM | POA: Diagnosis not present

## 2018-08-18 DIAGNOSIS — D509 Iron deficiency anemia, unspecified: Secondary | ICD-10-CM

## 2018-08-18 DIAGNOSIS — I714 Abdominal aortic aneurysm, without rupture: Secondary | ICD-10-CM

## 2018-08-18 DIAGNOSIS — D508 Other iron deficiency anemias: Secondary | ICD-10-CM

## 2018-08-18 DIAGNOSIS — D500 Iron deficiency anemia secondary to blood loss (chronic): Secondary | ICD-10-CM

## 2018-08-18 DIAGNOSIS — I10 Essential (primary) hypertension: Secondary | ICD-10-CM

## 2018-08-18 LAB — COMPREHENSIVE METABOLIC PANEL
ALBUMIN: 4 g/dL (ref 3.5–5.0)
ALT: 13 U/L (ref 0–44)
AST: 25 U/L (ref 15–41)
Alkaline Phosphatase: 90 U/L (ref 38–126)
Anion gap: 6 (ref 5–15)
BUN: 16 mg/dL (ref 8–23)
CALCIUM: 9 mg/dL (ref 8.9–10.3)
CO2: 24 mmol/L (ref 22–32)
CREATININE: 0.94 mg/dL (ref 0.61–1.24)
Chloride: 108 mmol/L (ref 98–111)
GFR calc non Af Amer: >60 mL/min (ref >60)
GLUCOSE: 102 mg/dL — AB (ref 70–99)
Potassium: 3.8 mmol/L (ref 3.5–5.1)
SODIUM: 138 mmol/L (ref 135–145)
Total Bilirubin: 0.5 mg/dL (ref 0.3–1.2)
Total Protein: 6.9 g/dL (ref 6.5–8.1)

## 2018-08-18 LAB — CBC WITH DIFFERENTIAL/PLATELET
Abs Immature Granulocytes: 0.02 10*3/uL (ref 0.00–0.07)
Basophils Absolute: 0.1 10*3/uL (ref 0.0–0.1)
Basophils Relative: 1 %
EOS ABS: 0.2 10*3/uL (ref 0.0–0.5)
Eosinophils Relative: 3 %
HCT: 33.5 % — ABNORMAL LOW (ref 39.0–52.0)
HEMOGLOBIN: 9.2 g/dL — AB (ref 13.0–17.0)
Immature Granulocytes: 0 %
LYMPHS ABS: 0.8 10*3/uL (ref 0.7–4.0)
Lymphocytes Relative: 11 %
MCH: 22.3 pg — AB (ref 26.0–34.0)
MCHC: 27.5 g/dL — ABNORMAL LOW (ref 30.0–36.0)
MCV: 81.3 fL (ref 80.0–100.0)
MONOS PCT: 13 %
Monocytes Absolute: 0.9 10*3/uL (ref 0.1–1.0)
NEUTROS PCT: 72 %
Neutro Abs: 5 10*3/uL (ref 1.7–7.7)
Platelets: 370 10*3/uL (ref 150–400)
RBC: 4.12 MIL/uL — ABNORMAL LOW (ref 4.22–5.81)
RDW: 26.5 % — ABNORMAL HIGH (ref 11.5–15.5)
WBC: 6.9 10*3/uL (ref 4.0–10.5)
nRBC: 0 % (ref 0.0–0.2)

## 2018-08-18 LAB — VITAMIN B12: VITAMIN B 12: 848 pg/mL (ref 180–914)

## 2018-08-18 LAB — FERRITIN: FERRITIN: 20 ng/mL — AB (ref 24–336)

## 2018-08-18 LAB — IRON AND TIBC
Iron: 26 ug/dL — ABNORMAL LOW (ref 45–182)
SATURATION RATIOS: 6 % — AB (ref 17.9–39.5)
TIBC: 410 ug/dL (ref 250–450)
UIBC: 384 ug/dL

## 2018-08-18 LAB — FOLATE: Folate: 22.6 ng/mL (ref >5.9)

## 2018-08-18 NOTE — Progress Notes (Signed)
Diagnosis Other iron deficiency anemia - Plan: CBC with Differential/Platelet, Comprehensive metabolic panel, Lactate dehydrogenase, Ferritin, Protein electrophoresis, serum, CT BONE MARROW BIOPSY & ASPIRATION, CT BIOPSY, CBC with Differential/Platelet, Comprehensive metabolic panel, Lactate dehydrogenase, Ferritin  Staging Cancer Staging No matching staging information was found for the patient.  Assessment and Plan:  1.  Iron deficiency anemia.  Pt is here today for follow-up.  He was last treated with IV iron on 06/18/2018.  Labs done 08/18/2018 reviewed and showed WBC 6.9 HB 9.2 plts 370,000.  Ferritin 20, B12 848.  Chemistries WNL with K+ 3.8 Cr 0.94 and normal LFTs.    Pt had CT neck, CAP done 08/01/2018 that was reviewed and showed  IMPRESSION: 11 mm left thyroid nodule is noted. Thyroid ultrasound is recommended for further evaluation.  No other significant abnormality seen in the soft tissues of the neck.  No acute abnormality is noted in the chest.  3.4 cm infrarenal abdominal aortic aneurysm is noted. Recommend followup by ultrasound in 3 years. This recommendation follows ACR consensus guidelines: White Paper of the ACR Incidental Findings Committee II on Vascular Findings. J Am Coll Radiol 2013; 10:789-794.  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  Aortic Atherosclerosis (ICD10-I70.0) and Emphysema (ICD10-J43.9).  Pt has been seen by GI and had colonoscopy done 06/24/2018 that showed internal hemorrhoids, diverticulosis and 1 polyp was removed with pathology returning as  Hyperplastic polyp and gastritis.  There was no evidence of malignancy.  He is scheduled for capsule study in 09/2018.    Prior Anemia work-up showed normal hemoglobin electrophoresis.  Pt had a normal B12, folate, haptoglobin, LDH.    CT CAP for further evaluation was unrevealing.  Due to persistent IDA despite iron therapy and transfusion,  bone marrow biopsy was  recommended to evaluate for  possibility for MDS.  Pt has option of repeat IV iron with Injectafer 750 mg IV D1 and D8.  He will have repeat labs in 09/2018 and will be seen for follow-up in 10/2018 with repeat labs.   2. Monoclonal gammopathy.  SPEP showed minimal m spike of 0.2 g/d.  Ifix is normal.  He has a normal FLC ratio of 1.44.  SPEP was repeated on 07/10/2018 and was negative.  Pt recommended for Bone marrow biopsy for further evaluation due to persistent anemia.    3.  Thyroid nodule.  I discussed with pt nodule measures 11 mm and he was recommended for thyroid USN.  He does not desire to have that performed.  He should follow-up with PCP or Dr. Luan Pulling for ongoing monitoring.    4  AAA.  This was noted on recent CT and measured 3.4 cm.  Pt should follow-up with PCP for ongoing monitoring.  He was given option of CT surgery referral but does not desire to be referred.    5.  Hypertension.  Blood pressure is 141/73.  Follow-up with PCP.  6.  Smoking.  Patient reports he has discontinued smoking in February 2019.  CT chest done 07/2018 showed no lung abnormalities.  Continue to follow-up with Dr. Luan Pulling of pulmonary for follow-up.    Greater than 25 minutes spent with more than 50% spent in counseling and coordination of care.    Interval History:  Historical data obtained from the note dated 04/02/2018.  82 year old male referred by Dr. Luan Pulling for evaluation of anemia.  The patient was seen in the emergency room due to severe anemia.  He reported he had been feeling  tired, also dizzy.  He had been seen at [facility] and was transferred to [facility] for evaluation.  Labs done showed a white count of 8.6 hemoglobin 6.7 platelets 307,000.  PT INR was within normal limits.  Potassium was 3.8 bicarb was 18 creatinine was 1.18.  Labs done 01/21/2018 showed a white count 11.4 hemoglobin 8.9 platelets 284,000.  MCV was 75.  H&H done 03/03/2018 showed a hemoglobin of 6.  He was last  transfused 03/03/2018.  He reports craving ice.  He denies any blood in his stool or his urine.  He had been offered GI evaluation but refused GI evaluation.  He reports he is no longer taking iron supplements.  He denies any family history of leukemia, lymphoma, hemoglobinopathy.  He was a longtime smoker reportedly last smoked in February 2019.  He reports he was hospitalized in [place] for shortness of breath and was intubated.  He was found to have IDA and was treated with IV iron on 03/20/2018 and 03/27/2018.    Labs done 03/16/2018 showed a white count 8.4 hemoglobin 7.7 MCV 72 platelets 388,000. Ferritin was 5, Creatinine is 1.02.   Current Status:  Pt is seen today for follow-up.  He is here to go over labs and scans.  He is scheduled for capsule study in 09/2018.     Problem List Patient Active Problem List   Diagnosis Date Noted  . Iron deficiency anemia due to chronic blood loss [D50.0] 03/17/2018  . Open wound of penis [S31.20XA] 01/21/2018  . Symptomatic anemia [D64.9] 01/20/2018  . Hypertension [I10] 01/20/2018  . Hypercholesteremia [E78.00] 01/20/2018  . GERD (gastroesophageal reflux disease) [K21.9] 01/20/2018  . Vitamin B deficiency [E53.9] 01/20/2018    Past Medical History Past Medical History:  Diagnosis Date  . Anemia   . GERD (gastroesophageal reflux disease)   . Hypercholesteremia   . Hypertension   . Iron deficiency anemia due to chronic blood loss 03/17/2018  . Vitamin B deficiency    patient denies    Past Surgical History Past Surgical History:  Procedure Laterality Date  . BIOPSY  06/24/2018   Procedure: BIOPSY;  Surgeon: Daneil Dolin, MD;  [facility];  Service: Endoscopy;;  gastric   . COLONOSCOPY N/A 06/24/2018   Dr. Gala Romney: Internal hemorrhoids, diverticulosis, 9 mm polyp removed from the sigmoid colon which was hyperplastic.  Marland Kitchen ESOPHAGOGASTRODUODENOSCOPY N/A 06/24/2018   Dr. Gala Romney: Multiple erosions in the stomach and duodenal bulb, gastric  biopsies with chronic gastritis with intestinal metaplasia, no H. pylori.  . none    . POLYPECTOMY  06/24/2018   Procedure: POLYPECTOMY;  Surgeon: Daneil Dolin, MD;  Location: [facility];  Service: Endoscopy;;  sigmoid polyp hs    Family History Family History  Problem Relation Age of Onset  . Colon cancer Neg Hx      Social History  reports that he quit smoking about 8 months ago. His smoking use included cigarettes. He has a 33.50 pack-year smoking history. He has never used smokeless tobacco. He reports that he does not drink alcohol or use drugs.  Medications  Current Outpatient Medications:  .  amLODipine (NORVASC) 5 MG tablet, Take 5 mg by mouth daily., Disp: , Rfl:  .  atorvastatin (LIPITOR) 10 MG tablet, Take 10 mg by mouth daily with supper. , Disp: , Rfl:  .  carvedilol (COREG) 12.5 MG tablet, Take 12.5 mg by mouth 2 (two) times daily with a meal., Disp: , Rfl:  .  ferrous sulfate 325 (65 FE) MG tablet, Take 1 tablet (325 mg total) by mouth daily., Disp: 30 tablet, Rfl: 0 .  multivitamin-iron-minerals-folic acid (CENTRUM) chewable tablet, Chew 1 tablet by mouth daily., Disp: , Rfl:  .  pantoprazole (PROTONIX) 40 MG tablet, Take 40 mg by mouth daily. , Disp: , Rfl:  .  triamcinolone cream (KENALOG) 0.1 %, Apply 1 application topically daily as needed (for rash)., Disp: , Rfl:  .  vitamin C (ASCORBIC ACID) 500 MG tablet, Take 500 mg by mouth daily., Disp: , Rfl:  .  vitamin E 100 UNIT capsule, Take 100 Units by mouth daily. , Disp: , Rfl:   Allergies Patient has no known allergies.  Review of Systems Review of Systems - Oncology ROS negative   Physical Exam  Vitals Wt Readings from Last 3 Encounters:  08/18/18 144 lb 1.6 oz (65.4 kg)  08/13/18 141 lb 6.4 oz (64.1 kg)  08/01/18 142 lb (64.4 kg)   Temp Readings from Last 3 Encounters:  08/18/18 98.2 F (36.8 C) (Oral)  08/13/18 (!) 96.7 F (35.9 C) (Oral)  08/05/18 97.9 F (36.6 C) (Oral)   BP Readings  from Last 3 Encounters:  08/18/18 (!) 141/73  08/13/18 125/74  08/05/18 (!) 141/59   Pulse Readings from Last 3 Encounters:  08/18/18 94  08/13/18 87  08/05/18 68   Constitutional: Well-developed, well-nourished, and in no distress.   HENT: Head: Normocephalic and atraumatic.  Mouth/Throat: No oropharyngeal exudate. Mucosa moist. Eyes: Pupils are equal, round, and reactive to light. Conjunctivae are normal. No scleral icterus.  Neck: Normal range of motion. Neck supple. No JVD present.  Cardiovascular: Normal rate, regular rhythm and normal heart sounds.  Exam reveals no gallop and no friction rub.   No murmur heard. Pulmonary/Chest: Effort normal and breath sounds normal. No respiratory distress. No wheezes.No rales.  Abdominal: Soft. Bowel sounds are normal. No distension. There is no tenderness. There is no guarding.  Musculoskeletal: No edema or tenderness.  Lymphadenopathy: No cervical, axillary or supraclavicular adenopathy.  Neurological: Alert and oriented to person, place, and time. No cranial nerve deficit.  Skin: Skin is warm and dry. No rash noted. No erythema. No pallor.  Psychiatric: Affect and judgment normal.   Labs Appointment on 08/18/2018  Component Date Value Ref Range Status  . WBC 08/18/2018 6.9  4.0 - 10.5 K/uL Final  . RBC 08/18/2018 4.12* 4.22 - 5.81 MIL/uL Final  . Hemoglobin 08/18/2018 9.2* 13.0 - 17.0 g/dL Final  . HCT 08/18/2018 33.5* 39.0 - 52.0 % Final  . MCV 08/18/2018 81.3  80.0 - 100.0 fL Final  . MCH 08/18/2018 22.3* 26.0 - 34.0 pg Final  . MCHC 08/18/2018 27.5* 30.0 - 36.0 g/dL Final  . RDW 08/18/2018 26.5* 11.5 - 15.5 % Final  . Platelets 08/18/2018 370  150 - 400 K/uL Final   Comment: PLATELET COUNT CONFIRMED BY SMEAR SPECIMEN CHECKED FOR CLOTS GIANT PLATELETS SEEN   . nRBC 08/18/2018 0.0  0.0 - 0.2 % Final  . Neutrophils Relative % 08/18/2018 72  % Final  . Neutro Abs 08/18/2018 5.0  1.7 - 7.7 K/uL Final  . Lymphocytes Relative  08/18/2018 11  % Final  . Lymphs Abs 08/18/2018 0.8  0.7 - 4.0 K/uL Final  . Monocytes Relative 08/18/2018 13  % Final  . Monocytes Absolute 08/18/2018 0.9  0.1 - 1.0 K/uL Final  . Eosinophils Relative 08/18/2018 3  % Final  . Eosinophils Absolute 08/18/2018 0.2  0.0 - 0.5  K/uL Final  . Basophils Relative 08/18/2018 1  % Final  . Basophils Absolute 08/18/2018 0.1  0.0 - 0.1 K/uL Final  . RBC Morphology 08/18/2018 ANISOCYTOSIS   Final  . Immature Granulocytes 08/18/2018 0  % Final  . Abs Immature Granulocytes 08/18/2018 0.02  0.00 - 0.07 K/uL Final  . Schistocytes 08/18/2018 PRESENT   Final   Performed at [facility], [address] [place], [place] [address]  . Sodium 08/18/2018 138  135 - 145 mmol/L Final  . Potassium 08/18/2018 3.8  3.5 - 5.1 mmol/L Final  . Chloride 08/18/2018 108  98 - 111 mmol/L Final  . CO2 08/18/2018 24  22 - 32 mmol/L Final  . Glucose, Bld 08/18/2018 102* 70 - 99 mg/dL Final  . BUN 08/18/2018 16  8 - 23 mg/dL Final  . Creatinine, Ser 08/18/2018 0.94  0.61 - 1.24 mg/dL Final  . Calcium 08/18/2018 9.0  8.9 - 10.3 mg/dL Final  . Total Protein 08/18/2018 6.9  6.5 - 8.1 g/dL Final  . Albumin 08/18/2018 4.0  3.5 - 5.0 g/dL Final  . AST 08/18/2018 25  15 - 41 U/L Final  . ALT 08/18/2018 13  0 - 44 U/L Final  . Alkaline Phosphatase 08/18/2018 90  38 - 126 U/L Final  . Total Bilirubin 08/18/2018 0.5  0.3 - 1.2 mg/dL Final  . GFR calc non Af Amer 08/18/2018 >60  >60 mL/min Final  . GFR calc Af Amer 08/18/2018 >60  >60 mL/min Final   Comment: (NOTE) The eGFR has been calculated using the CKD EPI equation. This calculation has not been validated in all clinical situations. eGFR's persistently <60 mL/min signify possible Chronic Kidney Disease.   Georgiann Hahn gap 08/18/2018 6  5 - 15 Final   Performed at [facility], [address] [place], [place] [address]  . Folate 08/18/2018 22.6  >5.9 ng/mL Final   Performed at [facility], [address], [place], [place]  [address]  . Vitamin B-12 08/18/2018 848  180 - 914 pg/mL Final   Comment: (NOTE) This assay is not validated for testing neonatal or myeloproliferative syndrome specimens for Vitamin B12 levels. Performed at [facility], [address] [place], [place] [address]   . Ferritin 08/18/2018 20* 24 - 336 ng/mL Final   Performed at [facility], [address], [place], [place] [address]  . Iron 08/18/2018 26* 45 - 182 ug/dL Final  . TIBC 08/18/2018 410  250 - 450 ug/dL Final  . Saturation Ratios 08/18/2018 6* 17.9 - 39.5 % Final  . UIBC 08/18/2018 384  ug/dL Final   Performed at [facility], [address], [place], [place] [address]     Pathology Orders Placed This Encounter  Procedures  . CT BONE MARROW BIOPSY & ASPIRATION    Standing Status:   Future    Standing Expiration Date:   11/19/2019    Order Specific Question:   Reason for Exam (SYMPTOM  OR DIAGNOSIS REQUIRED)    Answer:   Persistant anemia    Order Specific Question:   Preferred imaging location?    Answer:   [facility]    Order Specific Question:   Radiology Contrast Protocol - do NOT remove file path    Answer:   [email or web address]  . CT BIOPSY    Standing Status:   Future    Standing Expiration Date:   08/18/2019    Order Specific Question:   Lab orders requested (DO NOT place separate lab  orders, these will be automatically ordered during procedure specimen collection):    Answer:   Surgical Pathology    Comments:   fish for MDS and cytogenetics    Order Specific Question:   Lab orders requested (DO NOT place separate lab orders, these will be automatically ordered during procedure specimen collection):    Answer:   Other    Order Specific Question:   Reason for Exam (SYMPTOM  OR DIAGNOSIS REQUIRED)    Answer:   persistant anemia    Order Specific Question:   Preferred imaging location?    Answer:   [facility]    Order Specific Question:   Radiology Contrast Protocol - do  NOT remove file path    Answer:   [email or web address]  . CBC with Differential/Platelet    Standing Status:   Future    Standing Expiration Date:   08/19/2019  . Comprehensive metabolic panel    Standing Status:   Future    Standing Expiration Date:   08/19/2019  . Lactate dehydrogenase    Standing Status:   Future    Standing Expiration Date:   08/19/2019  . Ferritin    Standing Status:   Future    Standing Expiration Date:   08/19/2019  . Protein electrophoresis, serum    Standing Status:   Future    Standing Expiration Date:   08/19/2019  . CBC with Differential/Platelet    Standing Status:   Future    Standing Expiration Date:   08/19/2019  . Comprehensive metabolic panel    Standing Status:   Future    Standing Expiration Date:   08/19/2019  . Lactate dehydrogenase    Standing Status:   Future    Standing Expiration Date:   08/19/2019  . Ferritin    Standing Status:   Future    Standing Expiration Date:   08/19/2019       Zoila Shutter MD

## 2018-08-19 LAB — PROTEIN ELECTROPHORESIS, SERUM
A/G RATIO SPE: 1.3 (ref 0.7–1.7)
Albumin ELP: 3.6 g/dL (ref 2.9–4.4)
Alpha-1-Globulin: 0.3 g/dL (ref 0.0–0.4)
Alpha-2-Globulin: 0.7 g/dL (ref 0.4–1.0)
Beta Globulin: 1 g/dL (ref 0.7–1.3)
GLOBULIN, TOTAL: 2.7 g/dL (ref 2.2–3.9)
Gamma Globulin: 0.8 g/dL (ref 0.4–1.8)
TOTAL PROTEIN ELP: 6.3 g/dL (ref 6.0–8.5)

## 2018-08-19 LAB — IMMUNOFIXATION ELECTROPHORESIS
IGA: 125 mg/dL (ref 61–437)
IGG (IMMUNOGLOBIN G), SERUM: 829 mg/dL (ref 700–1600)
IgM (Immunoglobulin M), Srm: <5 mg/dL — ABNORMAL LOW (ref 15–143)
TOTAL PROTEIN ELP: 6.1 g/dL (ref 6.0–8.5)

## 2018-08-19 LAB — KAPPA/LAMBDA LIGHT CHAINS
Kappa free light chain: 31.2 mg/L — ABNORMAL HIGH (ref 3.3–19.4)
Kappa, lambda light chain ratio: 1.34 (ref 0.26–1.65)
LAMDA FREE LIGHT CHAINS: 23.2 mg/L (ref 5.7–26.3)

## 2018-08-19 LAB — HAPTOGLOBIN: HAPTOGLOBIN: 123 mg/dL (ref 34–200)

## 2018-08-19 LAB — IGG, IGA, IGM
IGG (IMMUNOGLOBIN G), SERUM: 805 mg/dL (ref 700–1600)
IgA: 122 mg/dL (ref 61–437)
IgM (Immunoglobulin M), Srm: <5 mg/dL — ABNORMAL LOW (ref 15–143)

## 2018-08-19 LAB — BETA 2 MICROGLOBULIN, SERUM: Beta-2 Microglobulin: 2.6 mg/L — ABNORMAL HIGH (ref 0.6–2.4)

## 2018-08-25 ENCOUNTER — Encounter (HOSPITAL_COMMUNITY): Payer: Self-pay

## 2018-08-25 ENCOUNTER — Inpatient Hospital Stay (HOSPITAL_COMMUNITY): Payer: PPO

## 2018-08-25 ENCOUNTER — Other Ambulatory Visit: Payer: Self-pay

## 2018-08-25 VITALS — BP 162/69 | HR 72 | Temp 98.1°F | Resp 18 | Wt 142.8 lb

## 2018-08-25 DIAGNOSIS — D500 Iron deficiency anemia secondary to blood loss (chronic): Secondary | ICD-10-CM

## 2018-08-25 DIAGNOSIS — D509 Iron deficiency anemia, unspecified: Secondary | ICD-10-CM | POA: Diagnosis not present

## 2018-08-25 MED ORDER — SODIUM CHLORIDE 0.9 % IV SOLN
750.0000 mg | Freq: Once | INTRAVENOUS | Status: AC
  Administered 2018-08-25: 750 mg via INTRAVENOUS
  Filled 2018-08-25: qty 15

## 2018-08-25 MED ORDER — SODIUM CHLORIDE 0.9 % IV SOLN
Freq: Once | INTRAVENOUS | Status: AC
  Administered 2018-08-25: 15:00:00 via INTRAVENOUS

## 2018-08-25 NOTE — Progress Notes (Signed)
Injectafer given per orders. Patient tolerated it well without problems. Vitals stable and discharged home from clinic ambulatory. Follow up as scheduled.

## 2018-08-25 NOTE — Patient Instructions (Signed)
[facility] Discharge Instructions     Thank you for choosing  Cancer Center at [facility] to provide your oncology and hematology care.  To afford each patient quality time with our provider, please arrive at least 15 minutes before your scheduled appointment time.   If you have a lab appointment with the Cancer Center please come in thru the  Main Entrance and check in at the main information desk  You need to re-schedule your appointment should you arrive 10 or more minutes late.  We strive to give you quality time with our providers, and arriving late affects you and other patients whose appointments are after yours.  Also, if you no show three or more times for appointments you may be dismissed from the clinic at the providers discretion.     Again, thank you for choosing [facility].  Our hope is that these requests will decrease the amount of time that you wait before being seen by our physicians.       _____________________________________________________________  Should you have questions after your visit to  Cancer Center, please contact our office at [phone] between the hours of 8:00 a.m. and 4:30 p.m.  Voicemails left after 4:00 p.m. will not be returned until the following business day.  For prescription refill requests, have your pharmacy contact our office and allow 72 hours.    Cancer Center Support Programs:   > Cancer Support Group  2nd Tuesday of the month 1pm-2pm, Journey Room    

## 2018-08-28 ENCOUNTER — Other Ambulatory Visit: Payer: Self-pay | Admitting: Student

## 2018-08-31 ENCOUNTER — Ambulatory Visit (HOSPITAL_COMMUNITY)
Admission: RE | Admit: 2018-08-31 | Discharge: 2018-08-31 | Disposition: A | Discharge status: Home / Self Care | Payer: PPO | Source: Ambulatory Visit | Attending: Internal Medicine | Admitting: Internal Medicine

## 2018-08-31 ENCOUNTER — Other Ambulatory Visit: Payer: Self-pay

## 2018-08-31 ENCOUNTER — Encounter (HOSPITAL_COMMUNITY): Payer: Self-pay

## 2018-08-31 DIAGNOSIS — Z8719 Personal history of other diseases of the digestive system: Secondary | ICD-10-CM | POA: Insufficient documentation

## 2018-08-31 DIAGNOSIS — J439 Emphysema, unspecified: Secondary | ICD-10-CM | POA: Insufficient documentation

## 2018-08-31 DIAGNOSIS — Z79899 Other long term (current) drug therapy: Secondary | ICD-10-CM | POA: Diagnosis not present

## 2018-08-31 DIAGNOSIS — I714 Abdominal aortic aneurysm, without rupture: Secondary | ICD-10-CM | POA: Diagnosis not present

## 2018-08-31 DIAGNOSIS — D508 Other iron deficiency anemias: Secondary | ICD-10-CM

## 2018-08-31 DIAGNOSIS — E7800 Pure hypercholesterolemia, unspecified: Secondary | ICD-10-CM | POA: Insufficient documentation

## 2018-08-31 DIAGNOSIS — D7589 Other specified diseases of blood and blood-forming organs: Secondary | ICD-10-CM | POA: Diagnosis not present

## 2018-08-31 DIAGNOSIS — E041 Nontoxic single thyroid nodule: Secondary | ICD-10-CM | POA: Diagnosis not present

## 2018-08-31 DIAGNOSIS — K219 Gastro-esophageal reflux disease without esophagitis: Secondary | ICD-10-CM | POA: Diagnosis not present

## 2018-08-31 DIAGNOSIS — I700 Atherosclerosis of aorta: Secondary | ICD-10-CM | POA: Insufficient documentation

## 2018-08-31 DIAGNOSIS — I10 Essential (primary) hypertension: Secondary | ICD-10-CM | POA: Diagnosis not present

## 2018-08-31 DIAGNOSIS — Z87891 Personal history of nicotine dependence: Secondary | ICD-10-CM | POA: Insufficient documentation

## 2018-08-31 DIAGNOSIS — R7889 Finding of other specified substances, not normally found in blood: Secondary | ICD-10-CM | POA: Insufficient documentation

## 2018-08-31 DIAGNOSIS — M5136 Other intervertebral disc degeneration, lumbar region: Secondary | ICD-10-CM | POA: Diagnosis not present

## 2018-08-31 DIAGNOSIS — D649 Anemia, unspecified: Secondary | ICD-10-CM | POA: Diagnosis not present

## 2018-08-31 LAB — CBC WITH DIFFERENTIAL/PLATELET
Abs Immature Granulocytes: 0.05 10*3/uL (ref 0.00–0.07)
Basophils Absolute: 0.1 10*3/uL (ref 0.0–0.1)
Basophils Relative: 1 %
EOS ABS: 0.2 10*3/uL (ref 0.0–0.5)
Eosinophils Relative: 3 %
HCT: 34.7 % — ABNORMAL LOW (ref 39.0–52.0)
HEMOGLOBIN: 9.3 g/dL — AB (ref 13.0–17.0)
Immature Granulocytes: 1 %
LYMPHS ABS: 1 10*3/uL (ref 0.7–4.0)
LYMPHS PCT: 12 %
MCH: 22.9 pg — ABNORMAL LOW (ref 26.0–34.0)
MCHC: 26.8 g/dL — ABNORMAL LOW (ref 30.0–36.0)
MCV: 85.3 fL (ref 80.0–100.0)
MONO ABS: 0.9 10*3/uL (ref 0.1–1.0)
Monocytes Relative: 11 %
NRBC: 0 % (ref 0.0–0.2)
Neutro Abs: 6.1 10*3/uL (ref 1.7–7.7)
Neutrophils Relative %: 72 %
Platelets: 499 10*3/uL — ABNORMAL HIGH (ref 150–400)
RBC: 4.07 MIL/uL — ABNORMAL LOW (ref 4.22–5.81)
RDW: 31.6 % — ABNORMAL HIGH (ref 11.5–15.5)
WBC: 8.4 10*3/uL (ref 4.0–10.5)

## 2018-08-31 MED ORDER — FLUMAZENIL 0.5 MG/5ML IV SOLN
INTRAVENOUS | Status: AC
  Filled 2018-08-31: qty 5

## 2018-08-31 MED ORDER — MIDAZOLAM HCL 2 MG/2ML IJ SOLN
INTRAMUSCULAR | Status: AC
  Filled 2018-08-31: qty 2

## 2018-08-31 MED ORDER — FENTANYL CITRATE (PF) 100 MCG/2ML IJ SOLN
INTRAMUSCULAR | Status: AC
  Filled 2018-08-31: qty 2

## 2018-08-31 MED ORDER — SODIUM CHLORIDE 0.9 % IV SOLN
INTRAVENOUS | Status: DC
  Administered 2018-08-31: 09:00:00 via INTRAVENOUS

## 2018-08-31 MED ORDER — MIDAZOLAM HCL 2 MG/2ML IJ SOLN
INTRAMUSCULAR | Status: AC | PRN
  Administered 2018-08-31 (×2): 1 mg via INTRAVENOUS

## 2018-08-31 MED ORDER — NALOXONE HCL 0.4 MG/ML IJ SOLN
INTRAMUSCULAR | Status: AC
  Filled 2018-08-31: qty 1

## 2018-08-31 MED ORDER — FENTANYL CITRATE (PF) 100 MCG/2ML IJ SOLN
INTRAMUSCULAR | Status: AC | PRN
  Administered 2018-08-31 (×2): 50 ug via INTRAVENOUS

## 2018-08-31 NOTE — Procedures (Signed)
Interventional Radiology Procedure Note  Procedure: CT guided aspirate and core biopsy of right iliac bone Complications: None Recommendations: - Bedrest supine x 1 hrs - Hydrocodone PRN  Pain - Follow biopsy results  Signed,  Heath K. McCullough, MD   

## 2018-08-31 NOTE — Consult Note (Signed)
Chief Complaint: Patient was seen in consultation today for CT-guided bone marrow biopsy   Referring Physician(s): Higgs,Vetta  Supervising Physician: Jacqulynn Cadet  Patient Status: [facility] - Out-pt  History of Present Illness: Jared Schmidt is an 82 y.o. male smoker with history of persistent anemia as well as slightly elevated kappa free light chains who presents today for CT-guided bone marrow biopsy for further evaluation.  Past Medical History:  Diagnosis Date  . Anemia   . GERD (gastroesophageal reflux disease)   . Hypercholesteremia   . Hypertension   . Iron deficiency anemia due to chronic blood loss 03/17/2018  . Vitamin B deficiency    patient denies    Past Surgical History:  Procedure Laterality Date  . BIOPSY  06/24/2018   Procedure: BIOPSY;  Surgeon: Daneil Dolin, MD;  Location: [facility];  Service: Endoscopy;;  gastric   . COLONOSCOPY N/A 06/24/2018   Dr. Gala Romney: Internal hemorrhoids, diverticulosis, 9 mm polyp removed from the sigmoid colon which was hyperplastic.  Marland Kitchen ESOPHAGOGASTRODUODENOSCOPY N/A 06/24/2018   Dr. Gala Romney: Multiple erosions in the stomach and duodenal bulb, gastric biopsies with chronic gastritis with intestinal metaplasia, no H. pylori.  . none    . POLYPECTOMY  06/24/2018   Procedure: POLYPECTOMY;  Surgeon: Daneil Dolin, MD;  Location: [facility];  Service: Endoscopy;;  sigmoid polyp hs    Allergies: Patient has no known allergies.  Medications: Prior to Admission medications   Medication Sig Start Date End Date Taking? Authorizing Provider  amLODipine (NORVASC) 5 MG tablet Take 5 mg by mouth daily.    [provider]  atorvastatin (LIPITOR) 10 MG tablet Take 10 mg by mouth daily with supper.     [provider]  carvedilol (COREG) 12.5 MG tablet Take 12.5 mg by mouth 2 (two) times daily with a meal.    [provider]  ferrous sulfate 325 (65 FE) MG tablet Take 1 tablet (325 mg total) by mouth  daily. 08/01/18   Isla Pence, MD  multivitamin-iron-minerals-folic acid (CENTRUM) chewable tablet Chew 1 tablet by mouth daily.    [provider]  pantoprazole (PROTONIX) 40 MG tablet Take 40 mg by mouth daily.     [provider]  triamcinolone cream (KENALOG) 0.1 % Apply 1 application topically daily as needed (for rash).    [provider]  vitamin C (ASCORBIC ACID) 500 MG tablet Take 500 mg by mouth daily.    [provider]  vitamin E 100 UNIT capsule Take 100 Units by mouth daily.     [provider]     Family History  Problem Relation Age of Onset  . Colon cancer Neg Hx     Social History   Socioeconomic History  . Marital status: Married    Spouse name: Abelardo Seidner  . Number of children: Not on file  . Years of education: Not on file  . Highest education level: 8th grade  Occupational History  . Not on file  Social Needs  . Financial resource strain: Not on file  . Food insecurity:    Worry: Not on file    Inability: Not on file  . Transportation needs:    Medical: Not on file    Non-medical: Not on file  Tobacco Use  . Smoking status: Former Smoker    Packs/day: 0.50    Years: 67.00    Pack years: 33.50    Types: Cigarettes    Last attempt to quit:  12/11/2017    Years since quitting: 0.7  . Smokeless tobacco: Never Used  Substance and Sexual Activity  . Alcohol use: No    Frequency: Never  . Drug use: No  . Sexual activity: Not Currently  Lifestyle  . Physical activity:    Days per week: Not on file    Minutes per session: Not on file  . Stress: Not on file  Relationships  . Social connections:    Talks on phone: Not on file    Gets together: Not on file    Attends religious service: Not on file    Active member of club or organization: Not on file    Attends meetings of clubs or organizations: Not on file    Relationship status: Not on file  Other Topics Concern  . Not on file  Social History  Narrative  . Not on file     Review of Systems denies fever, headache, chest pain, dyspnea, cough, abdominal/back pain, nausea, vomiting or bleeding.  Vital Signs: BP (!) 155/77 (BP Location: Left Arm)   Pulse 75   Resp 18   SpO2 99%   Physical Exam awake, alert.  Chest with distant breath sounds bilaterally.  Heart with normal rate, some occasional ectopy.  Abdomen soft, positive bowel sounds, nontender.  No lower extremity edema.  Imaging: Ct Soft Tissue Neck W Contrast  Result Date: 08/01/2018 CLINICAL DATA:  Gross hematuria, neck pain.  Shortness of breath. EXAM: CT OF THE NECK WITH CONTRAST CT OF THE CHEST WITH CONTRAST CT OF THE ABDOMEN AND PELVIS WITH CONTRAST TECHNIQUE: Multidetector CT imaging of the neck was performed with intravenous contrast.; Multidetector CT imaging of the abdomen and pelvis was performed following the standard protocol during bolus administration of intravenous contrast.; Multidetector CT imaging of the chest was performed following the standard protocol during bolus administration of intravenous contrast. CONTRAST:  <See Chart> ISOVUE-300 IOPAMIDOL (ISOVUE-300) INJECTION 61%, 166m ISOVUE-300 IOPAMIDOL (ISOVUE-300) INJECTION 61% COMPARISON:  None. FINDINGS: CT NECK FINDINGS Pharynx and larynx: Normal. No mass or swelling. Salivary glands: No inflammation, mass, or stone. Thyroid: 11 mm left thyroid cyst is noted. Thyroid ultrasound is recommended for further evaluation. Lymph nodes: None enlarged or abnormal density. Vascular: Atherosclerotic calcifications of carotid arteries are noted without significant stenosis. No other significant vascular abnormality is noted. Limited intracranial: Negative. Visualized orbits: Negative. Mastoids and visualized paranasal sinuses: Clear. Skeleton: No acute or aggressive process. Upper chest: See below. Other: None. CT CHEST FINDINGS Cardiovascular: Atherosclerosis of thoracic aorta is noted without aneurysm or dissection.  Normal cardiac size. No pericardial effusion is noted. Mild coronary artery calcifications are noted. Mediastinum/Nodes: Esophagus is unremarkable. No mediastinal adenopathy is noted. Lungs/Pleura: No pneumothorax or pleural effusion is noted. Mild emphysematous disease is noted in both upper lobes. No consolidative process is noted. Musculoskeletal: No chest wall mass or suspicious bone lesions identified. CT ABDOMEN AND PELVIS FINDINGS Hepatobiliary: No focal liver abnormality is seen. No gallstones, gallbladder wall thickening, or biliary dilatation. Pancreas: Unremarkable. No pancreatic ductal dilatation or surrounding inflammatory changes. Spleen: Normal in size without focal abnormality. Adrenals/Urinary Tract: Adrenal glands are unremarkable. Kidneys are normal, without renal calculi, focal lesion, or hydronephrosis. Bladder is unremarkable. Stomach/Bowel: Stomach is within normal limits. Appendix appears normal. No evidence of bowel wall thickening, distention, or inflammatory changes. Stool is noted throughout the colon. Vascular/Lymphatic: 3.4 cm infrarenal abdominal aortic aneurysm is noted. Atherosclerosis of abdominal aorta is noted without dissection. No significant adenopathy is noted. Reproductive: Prostate is unremarkable.  Other: No abdominal wall hernia or abnormality. No abdominopelvic ascites. 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 No acute osseous abnormality is noted. IMPRESSION: 11 mm left thyroid nodule is noted. Thyroid ultrasound is recommended for further evaluation. No other significant abnormality seen in the soft tissues of the neck. No acute abnormality is noted in the chest. 3.4 cm infrarenal abdominal aortic aneurysm is noted. Recommend followup by ultrasound in 3 years. This recommendation follows ACR consensus guidelines: White Paper of the ACR Incidental Findings Committee II on Vascular Findings. J Am Coll Radiol 2013; 10:789-794.  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 Aortic Atherosclerosis (ICD10-I70.0) and Emphysema (ICD10-J43.9). Electronically Signed   By: Marijo Conception, M.D.   On: 08/01/2018 18:18   Ct Chest W Contrast  Result Date: 08/01/2018 CLINICAL DATA:  Gross hematuria, neck pain.  Shortness of breath. EXAM: CT OF THE NECK WITH CONTRAST CT OF THE CHEST WITH CONTRAST CT OF THE ABDOMEN AND PELVIS WITH CONTRAST TECHNIQUE: Multidetector CT imaging of the neck was performed with intravenous contrast.; Multidetector CT imaging of the abdomen and pelvis was performed following the standard protocol during bolus administration of intravenous contrast.; Multidetector CT imaging of the chest was performed following the standard protocol during bolus administration of intravenous contrast. CONTRAST:  <See Chart> ISOVUE-300 IOPAMIDOL (ISOVUE-300) INJECTION 61%, 141m ISOVUE-300 IOPAMIDOL (ISOVUE-300) INJECTION 61% COMPARISON:  None. FINDINGS: CT NECK FINDINGS Pharynx and larynx: Normal. No mass or swelling. Salivary glands: No inflammation, mass, or stone. Thyroid: 11 mm left thyroid cyst is noted. Thyroid ultrasound is recommended for further evaluation. Lymph nodes: None enlarged or abnormal density. Vascular: Atherosclerotic calcifications of carotid arteries are noted without significant stenosis. No other significant vascular abnormality is noted. Limited intracranial: Negative. Visualized orbits: Negative. Mastoids and visualized paranasal sinuses: Clear. Skeleton: No acute or aggressive process. Upper chest: See below. Other: None. CT CHEST FINDINGS Cardiovascular: Atherosclerosis of thoracic aorta is noted without aneurysm or dissection. Normal cardiac size. No pericardial effusion is noted. Mild coronary artery calcifications are noted. Mediastinum/Nodes: Esophagus is unremarkable. No mediastinal adenopathy is noted. Lungs/Pleura: No pneumothorax or pleural effusion is noted. Mild emphysematous  disease is noted in both upper lobes. No consolidative process is noted. Musculoskeletal: No chest wall mass or suspicious bone lesions identified. CT ABDOMEN AND PELVIS FINDINGS Hepatobiliary: No focal liver abnormality is seen. No gallstones, gallbladder wall thickening, or biliary dilatation. Pancreas: Unremarkable. No pancreatic ductal dilatation or surrounding inflammatory changes. Spleen: Normal in size without focal abnormality. Adrenals/Urinary Tract: Adrenal glands are unremarkable. Kidneys are normal, without renal calculi, focal lesion, or hydronephrosis. Bladder is unremarkable. Stomach/Bowel: Stomach is within normal limits. Appendix appears normal. No evidence of bowel wall thickening, distention, or inflammatory changes. Stool is noted throughout the colon. Vascular/Lymphatic: 3.4 cm infrarenal abdominal aortic aneurysm is noted. Atherosclerosis of abdominal aorta is noted without dissection. No significant adenopathy is noted. Reproductive: Prostate is unremarkable. Other: No abdominal wall hernia or abnormality. No abdominopelvic ascites. 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 No acute osseous abnormality is noted. IMPRESSION: 11 mm left thyroid nodule is noted. Thyroid ultrasound is recommended for further evaluation. No other significant abnormality seen in the soft tissues of the neck. No acute abnormality is noted in the chest. 3.4 cm infrarenal abdominal aortic aneurysm is noted. Recommend followup by ultrasound in 3 years. This recommendation follows ACR consensus guidelines: White Paper of the ACR Incidental Findings Committee II on Vascular Findings. J Am  Coll Radiol 2013; 10:789-794. 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 Aortic Atherosclerosis (ICD10-I70.0) and Emphysema (ICD10-J43.9). Electronically Signed   By: Marijo Conception, M.D.   On: 08/01/2018 18:18   Ct Abdomen Pelvis W Contrast  Result Date:  08/01/2018 CLINICAL DATA:  Gross hematuria, neck pain.  Shortness of breath. EXAM: CT OF THE NECK WITH CONTRAST CT OF THE CHEST WITH CONTRAST CT OF THE ABDOMEN AND PELVIS WITH CONTRAST TECHNIQUE: Multidetector CT imaging of the neck was performed with intravenous contrast.; Multidetector CT imaging of the abdomen and pelvis was performed following the standard protocol during bolus administration of intravenous contrast.; Multidetector CT imaging of the chest was performed following the standard protocol during bolus administration of intravenous contrast. CONTRAST:  <See Chart> ISOVUE-300 IOPAMIDOL (ISOVUE-300) INJECTION 61%, 18m ISOVUE-300 IOPAMIDOL (ISOVUE-300) INJECTION 61% COMPARISON:  None. FINDINGS: CT NECK FINDINGS Pharynx and larynx: Normal. No mass or swelling. Salivary glands: No inflammation, mass, or stone. Thyroid: 11 mm left thyroid cyst is noted. Thyroid ultrasound is recommended for further evaluation. Lymph nodes: None enlarged or abnormal density. Vascular: Atherosclerotic calcifications of carotid arteries are noted without significant stenosis. No other significant vascular abnormality is noted. Limited intracranial: Negative. Visualized orbits: Negative. Mastoids and visualized paranasal sinuses: Clear. Skeleton: No acute or aggressive process. Upper chest: See below. Other: None. CT CHEST FINDINGS Cardiovascular: Atherosclerosis of thoracic aorta is noted without aneurysm or dissection. Normal cardiac size. No pericardial effusion is noted. Mild coronary artery calcifications are noted. Mediastinum/Nodes: Esophagus is unremarkable. No mediastinal adenopathy is noted. Lungs/Pleura: No pneumothorax or pleural effusion is noted. Mild emphysematous disease is noted in both upper lobes. No consolidative process is noted. Musculoskeletal: No chest wall mass or suspicious bone lesions identified. CT ABDOMEN AND PELVIS FINDINGS Hepatobiliary: No focal liver abnormality is seen. No gallstones,  gallbladder wall thickening, or biliary dilatation. Pancreas: Unremarkable. No pancreatic ductal dilatation or surrounding inflammatory changes. Spleen: Normal in size without focal abnormality. Adrenals/Urinary Tract: Adrenal glands are unremarkable. Kidneys are normal, without renal calculi, focal lesion, or hydronephrosis. Bladder is unremarkable. Stomach/Bowel: Stomach is within normal limits. Appendix appears normal. No evidence of bowel wall thickening, distention, or inflammatory changes. Stool is noted throughout the colon. Vascular/Lymphatic: 3.4 cm infrarenal abdominal aortic aneurysm is noted. Atherosclerosis of abdominal aorta is noted without dissection. No significant adenopathy is noted. Reproductive: Prostate is unremarkable. Other: No abdominal wall hernia or abnormality. No abdominopelvic ascites. 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 No acute osseous abnormality is noted. IMPRESSION: 11 mm left thyroid nodule is noted. Thyroid ultrasound is recommended for further evaluation. No other significant abnormality seen in the soft tissues of the neck. No acute abnormality is noted in the chest. 3.4 cm infrarenal abdominal aortic aneurysm is noted. Recommend followup by ultrasound in 3 years. This recommendation follows ACR consensus guidelines: White Paper of the ACR Incidental Findings Committee II on Vascular Findings. J Am Coll Radiol 2013; 10:789-794. 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 Aortic Atherosclerosis (ICD10-I70.0) and Emphysema (ICD10-J43.9). Electronically Signed   By: JMarijo Conception M.D.   On: 08/01/2018 18:18    Labs:  CBC: Recent Labs    07/10/18 1106 08/01/18 1418 08/04/18 1335 08/18/18 1328  WBC 6.1 7.3 5.0 6.9  HGB 8.0* 7.1* 6.8* 9.2*  HCT 28.6* 26.4* 25.3* 33.5*  PLT 445* 382 363 370    COAGS: Recent Labs    01/20/18 2007  INR 1.06    BMP:  Recent Labs    07/10/18 1106  08/01/18 1418 08/04/18 1335 08/18/18 1328  NA 141 137 141 138  K 4.6 3.9 4.1 3.8  CL 112* 109 111 108  CO2 _0 GLUCOSE 107* 110* 99 102*  BUN _1 CALCIUM 8.3* 9.1 8.9 9.0  CREATININE 1.02 1.21 1.15 0.94  GFRNONAA >60 55* 58* >60  GFRAA >60 >60 >60 >60    LIVER FUNCTION TESTS: Recent Labs    06/22/18 1053 07/10/18 1106 08/04/18 1335 08/18/18 1328  BILITOT 0.5 0.5 0.6 0.5  AST _2 ALT _3 ALKPHOS 78 85 88 90  PROT 6.0* 6.3* 6.8 6.9  ALBUMIN 3.4* 3.5 3.6 4.0    TUMOR MARKERS: No results for input(s): AFPTM, CEA, CA199, CHROMGRNA in the last 8760 hours.  Assessment and Plan:  82 y.o. male smoker with history of persistent anemia as well as slightly elevated kappa free light chains who presents today for CT-guided bone marrow biopsy for further evaluation.Risks and benefits discussed with the patient/spouse including, but not limited to bleeding, infection, damage to adjacent structures or low yield requiring additional tests.  All of the patient's questions were answered, patient is agreeable to proceed. Consent signed and in chart.     Thank you for this interesting consult.  I greatly enjoyed meeting SHYHEEM WHITHAM and look forward to participating in their care.  A copy of this report was sent to the requesting provider on this date.  Electronically Signed: D. Rowe Robert, PA-C 08/31/2018, 9:02 AM   I spent a total of 20 minutes  in face to face in clinical consultation, greater than 50% of which was counseling/coordinating care for CT-guided bone marrow biopsy

## 2018-08-31 NOTE — Discharge Instructions (Addendum)
Bone Marrow Aspiration and Bone Marrow Biopsy, Adult, Care After This sheet gives you information about how to care for yourself after your procedure. Your health care provider may also give you more specific instructions. If you have problems or questions, contact your health care provider. What can I expect after the procedure? After the procedure, it is common to have:  Mild pain and tenderness.  Swelling.  Bruising.  Follow these instructions at home:  Take over-the-counter or prescription medicines only as told by your health care provider.  You may shower tomorrow.  Do not take baths, swim, or use a hot tub until your health care provider approves. Ask if you can take a shower or have a sponge bath.  Follow instructions from your health care provider about how to take care of the puncture site. Make sure you: ? Wash your hands with soap and water before you change your bandage (dressing). If soap and water are not available, use hand sanitizer. ? Change your dressing as told by your health care provider.  You may remove your dressing tomorrow.  Check your puncture siteevery day for signs of infection. Check for: ? More redness, swelling, or pain. ? More fluid or blood. ? Warmth. ? Pus or a bad smell.  Return to your normal activities as told by your health care provider. Ask your health care provider what activities are safe for you.  Do not drive for 24 hours if you were given a medicine to help you relax (sedative).  Keep all follow-up visits as told by your health care provider. This is important. Contact a health care provider if:  You have more redness, swelling, or pain around the puncture site.  You have more fluid or blood coming from the puncture site.  Your puncture site feels warm to the touch.  You have pus or a bad smell coming from the puncture site.  You have a fever.  Your pain is not controlled with medicine. This information is not intended to  replace advice given to you by your health care provider. Make sure you discuss any questions you have with your health care provider. Document Released: 05/10/2005 Document Revised: 05/10/2016 Document Reviewed: 04/03/2016 Elsevier Interactive Patient Education  2018 [place]. Moderate Conscious Sedation, Adult, Care After These instructions provide you with information about caring for yourself after your procedure. Your health care provider may also give you more specific instructions. Your treatment has been planned according to current medical practices, but problems sometimes occur. Call your health care provider if you have any problems or questions after your procedure. What can I expect after the procedure? After your procedure, it is common:  To feel sleepy for several hours.  To feel clumsy and have poor balance for several hours.  To have poor judgment for several hours.  To vomit if you eat too soon.  Follow these instructions at home: For at least 24 hours after the procedure:   Do not: ? Participate in activities where you could fall or become injured. ? Drive. ? Use heavy machinery. ? Drink alcohol. ? Take sleeping pills or medicines that cause drowsiness. ? Make important decisions or sign legal documents. ? Take care of children on your own.  Rest. Eating and drinking  Follow the diet recommended by your health care provider.  If you vomit: ? Drink water, juice, or soup when you can drink without vomiting. ? Make sure you have little or no nausea before eating solid foods. General  instructions  Have a responsible adult stay with you until you are awake and alert.  Take over-the-counter and prescription medicines only as told by your health care provider.  If you smoke, do not smoke without supervision.  Keep all follow-up visits as told by your health care provider. This is important. Contact a health care provider if:  You keep feeling nauseous  or you keep vomiting.  You feel light-headed.  You develop a rash.  You have a fever. Get help right away if:  You have trouble breathing. This information is not intended to replace advice given to you by your health care provider. Make sure you discuss any questions you have with your health care provider. Document Released: 08/11/2013 Document Revised: 03/25/2016 Document Reviewed: 02/10/2016 Elsevier Interactive Patient Education  [employer].

## 2018-09-01 ENCOUNTER — Ambulatory Visit (HOSPITAL_COMMUNITY): Payer: PPO

## 2018-09-04 ENCOUNTER — Encounter (HOSPITAL_COMMUNITY): Payer: Self-pay

## 2018-09-04 ENCOUNTER — Inpatient Hospital Stay (HOSPITAL_COMMUNITY): Payer: PPO | Attending: Internal Medicine

## 2018-09-04 VITALS — BP 157/69 | HR 88 | Temp 97.9°F | Resp 18 | Wt 141.6 lb

## 2018-09-04 DIAGNOSIS — D472 Monoclonal gammopathy: Secondary | ICD-10-CM | POA: Diagnosis not present

## 2018-09-04 DIAGNOSIS — K2970 Gastritis, unspecified, without bleeding: Secondary | ICD-10-CM | POA: Diagnosis not present

## 2018-09-04 DIAGNOSIS — E041 Nontoxic single thyroid nodule: Secondary | ICD-10-CM | POA: Insufficient documentation

## 2018-09-04 DIAGNOSIS — D500 Iron deficiency anemia secondary to blood loss (chronic): Secondary | ICD-10-CM | POA: Diagnosis not present

## 2018-09-04 DIAGNOSIS — K648 Other hemorrhoids: Secondary | ICD-10-CM | POA: Insufficient documentation

## 2018-09-04 DIAGNOSIS — I10 Essential (primary) hypertension: Secondary | ICD-10-CM | POA: Insufficient documentation

## 2018-09-04 DIAGNOSIS — I714 Abdominal aortic aneurysm, without rupture: Secondary | ICD-10-CM | POA: Insufficient documentation

## 2018-09-04 MED ORDER — SODIUM CHLORIDE 0.9 % IV SOLN
750.0000 mg | Freq: Once | INTRAVENOUS | Status: AC
  Administered 2018-09-04: 750 mg via INTRAVENOUS
  Filled 2018-09-04: qty 15

## 2018-09-04 MED ORDER — SODIUM CHLORIDE 0.9 % IV SOLN
Freq: Once | INTRAVENOUS | Status: AC
  Administered 2018-09-04: 14:00:00 via INTRAVENOUS

## 2018-09-04 NOTE — Patient Instructions (Signed)
[facility]  Discharge Instructions:   _______________________________________________________________  Thank you for choosing [facility] at [facility] to provide your oncology and hematology care.  To afford each patient quality time with our providers, please arrive at least 15 minutes before your scheduled appointment.  You need to re-schedule your appointment if you arrive 10 or more minutes late.  We strive to give you quality time with our providers, and arriving late affects you and other patients whose appointments are after yours.  Also, if you no show three or more times for appointments you may be dismissed from the clinic.  Again, thank you for choosing [facility]. Our hope is that these requests will allow you access to exceptional care and in a timely manner. _______________________________________________________________  If you have questions after your visit, please contact our office at [phone] between the hours of 8:30 a.m. and 5:00 p.m. Voicemails left after 4:30 p.m. will not be returned until the following business day. _______________________________________________________________  For prescription refill requests, have your pharmacy contact our office. _______________________________________________________________  Recommendations made by the consultant and any test results will be sent to your referring physician. _______________________________________________________________ 

## 2018-09-04 NOTE — Progress Notes (Signed)
Patient tolerated iron infusion with no complaints voiced.  Peripheral IV site clean and dry with no bruising or swelling noted at site.  Good blood return noted before and after administration of iron.  Band aid applied.  VSS with discharge and left ambulatory with no s/s of distress noted.

## 2018-09-07 ENCOUNTER — Telehealth: Payer: Self-pay | Admitting: Gastroenterology

## 2018-09-07 NOTE — Telephone Encounter (Signed)
Routing to LSL for advice.

## 2018-09-07 NOTE — Telephone Encounter (Signed)
[phone] PLEASE CALL PATIENT, SHE HAS A QUESTION ABOUT HIM HAVING THE PILL TEST DONE.  HE HAD A BONE MARROW TEST AND THEY WANT TO KNOW IF THE PILL IS STILL NEEDED

## 2018-09-08 ENCOUNTER — Inpatient Hospital Stay (HOSPITAL_COMMUNITY): Payer: PPO

## 2018-09-08 DIAGNOSIS — D508 Other iron deficiency anemias: Secondary | ICD-10-CM

## 2018-09-08 DIAGNOSIS — K648 Other hemorrhoids: Secondary | ICD-10-CM | POA: Diagnosis not present

## 2018-09-08 LAB — COMPREHENSIVE METABOLIC PANEL
ALK PHOS: 84 U/L (ref 38–126)
ALT: 15 U/L (ref 0–44)
AST: 26 U/L (ref 15–41)
Albumin: 3.6 g/dL (ref 3.5–5.0)
Anion gap: 6 (ref 5–15)
BILIRUBIN TOTAL: 0.3 mg/dL (ref 0.3–1.2)
BUN: 13 mg/dL (ref 8–23)
CALCIUM: 8.4 mg/dL — AB (ref 8.9–10.3)
CO2: 22 mmol/L (ref 22–32)
Chloride: 112 mmol/L — ABNORMAL HIGH (ref 98–111)
Creatinine, Ser: 0.85 mg/dL (ref 0.61–1.24)
GFR calc Af Amer: >60 mL/min (ref >60)
GFR calc non Af Amer: >60 mL/min (ref >60)
GLUCOSE: 101 mg/dL — AB (ref 70–99)
POTASSIUM: 3.8 mmol/L (ref 3.5–5.1)
SODIUM: 140 mmol/L (ref 135–145)
Total Protein: 6.6 g/dL (ref 6.5–8.1)

## 2018-09-08 LAB — CBC WITH DIFFERENTIAL/PLATELET
ABS IMMATURE GRANULOCYTES: 0.02 10*3/uL (ref 0.00–0.07)
Basophils Absolute: 0.1 10*3/uL (ref 0.0–0.1)
Basophils Relative: 1 %
EOS ABS: 0.2 10*3/uL (ref 0.0–0.5)
EOS PCT: 3 %
HEMATOCRIT: 34.5 % — AB (ref 39.0–52.0)
HEMOGLOBIN: 9.3 g/dL — AB (ref 13.0–17.0)
Immature Granulocytes: 0 %
Lymphocytes Relative: 12 %
Lymphs Abs: 0.8 10*3/uL (ref 0.7–4.0)
MCH: 23.3 pg — ABNORMAL LOW (ref 26.0–34.0)
MCHC: 27 g/dL — ABNORMAL LOW (ref 30.0–36.0)
MCV: 86.3 fL (ref 80.0–100.0)
MONO ABS: 0.9 10*3/uL (ref 0.1–1.0)
MONOS PCT: 13 %
Neutro Abs: 4.7 10*3/uL (ref 1.7–7.7)
Neutrophils Relative %: 71 %
Platelets: 327 10*3/uL (ref 150–400)
RBC: 4 MIL/uL — ABNORMAL LOW (ref 4.22–5.81)
RDW: 28.7 % — ABNORMAL HIGH (ref 11.5–15.5)
WBC: 6.7 10*3/uL (ref 4.0–10.5)
nRBC: 0 % (ref 0.0–0.2)

## 2018-09-08 LAB — FERRITIN: Ferritin: 1032 ng/mL — ABNORMAL HIGH (ref 24–336)

## 2018-09-08 LAB — LACTATE DEHYDROGENASE: LDH: 143 U/L (ref 98–192)

## 2018-09-08 LAB — SAMPLE TO BLOOD BANK

## 2018-09-09 ENCOUNTER — Encounter (HOSPITAL_COMMUNITY): Payer: Self-pay | Admitting: Internal Medicine

## 2018-09-09 ENCOUNTER — Ambulatory Visit (INDEPENDENT_AMBULATORY_CARE_PROVIDER_SITE_OTHER): Payer: PPO | Admitting: Urology

## 2018-09-09 DIAGNOSIS — R310 Gross hematuria: Secondary | ICD-10-CM

## 2018-09-09 LAB — PROTEIN ELECTROPHORESIS, SERUM
A/G Ratio: 1.2 (ref 0.7–1.7)
ALPHA-1-GLOBULIN: 0.3 g/dL (ref 0.0–0.4)
Albumin ELP: 3.2 g/dL (ref 2.9–4.4)
Alpha-2-Globulin: 0.8 g/dL (ref 0.4–1.0)
Beta Globulin: 0.8 g/dL (ref 0.7–1.3)
GAMMA GLOBULIN: 0.7 g/dL (ref 0.4–1.8)
GLOBULIN, TOTAL: 2.7 g/dL (ref 2.2–3.9)
Total Protein ELP: 5.9 g/dL — ABNORMAL LOW (ref 6.0–8.5)

## 2018-09-09 NOTE — Telephone Encounter (Signed)
Yes he needs capsule study done. His bone marrow biopsy was c/w IDA which remains unexplained.

## 2018-09-09 NOTE — Telephone Encounter (Signed)
Called and informed pts wife

## 2018-09-10 ENCOUNTER — Inpatient Hospital Stay (HOSPITAL_BASED_OUTPATIENT_CLINIC_OR_DEPARTMENT_OTHER): Payer: PPO | Admitting: Internal Medicine

## 2018-09-10 ENCOUNTER — Encounter (HOSPITAL_COMMUNITY): Payer: Self-pay | Admitting: Internal Medicine

## 2018-09-10 VITALS — BP 162/65 | HR 86 | Temp 97.6°F | Resp 18 | Wt 141.7 lb

## 2018-09-10 DIAGNOSIS — I714 Abdominal aortic aneurysm, without rupture: Secondary | ICD-10-CM

## 2018-09-10 DIAGNOSIS — E041 Nontoxic single thyroid nodule: Secondary | ICD-10-CM

## 2018-09-10 DIAGNOSIS — K2970 Gastritis, unspecified, without bleeding: Secondary | ICD-10-CM | POA: Diagnosis not present

## 2018-09-10 DIAGNOSIS — K648 Other hemorrhoids: Secondary | ICD-10-CM

## 2018-09-10 DIAGNOSIS — I10 Essential (primary) hypertension: Secondary | ICD-10-CM | POA: Diagnosis not present

## 2018-09-10 DIAGNOSIS — D472 Monoclonal gammopathy: Secondary | ICD-10-CM

## 2018-09-10 DIAGNOSIS — D500 Iron deficiency anemia secondary to blood loss (chronic): Secondary | ICD-10-CM | POA: Diagnosis not present

## 2018-09-10 NOTE — Progress Notes (Signed)
Diagnosis Iron deficiency anemia due to chronic blood loss - Plan: CBC with Differential/Platelet, Comprehensive metabolic panel, Lactate dehydrogenase, Ferritin, Haptoglobin, CBC with Differential/Platelet, Comprehensive metabolic panel, Lactate dehydrogenase, Ferritin, Protein electrophoresis, serum  Staging Cancer Staging No matching staging information was found for the patient.  Assessment and Plan:   1.  Iron deficiency anemia.  Pt is here today for follow-up.  He was last treated with IV iron on 09/04/2018.    Pt had CT neck, CAP done 08/01/2018 that was reviewed and showed  IMPRESSION: 11 mm left thyroid nodule is noted. Thyroid ultrasound is recommended for further evaluation.  No other significant abnormality seen in the soft tissues of the neck.  No acute abnormality is noted in the chest.  3.4 cm infrarenal abdominal aortic aneurysm is noted. Recommend followup by ultrasound in 3 years. This recommendation follows ACR consensus guidelines: White Paper of the ACR Incidental Findings Committee II on Vascular Findings. J Am Coll Radiol 2013; 10:789-794.  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  Aortic Atherosclerosis (ICD10-I70.0) and Emphysema (ICD10-J43.9).  Pt has been seen by GI and had colonoscopy done 06/24/2018 that showed internal hemorrhoids, diverticulosis and 1 polyp was removed with pathology returning as  Hyperplastic polyp and gastritis.  There was no evidence of malignancy.  He is scheduled for capsule study in 09/2018.    Prior Anemia work-up showed normal hemoglobin electrophoresis.  Pt had a normal B12, folate, haptoglobin, LDH.  Labs done 09/08/2018 reviewed and showed wBC 6.7 HB 9.3 plts 327,000.  Chemistries WNL with K+ 3.8 Cr 0.85 and normal LFTs.  Ferritin 1032.  SPEP negative.    CT CAP for further evaluation was unrevealing.  Due to persistent IDA despite iron therapy and transfusion,  bone marrow biopsy was done  08/31/2018 and showed [place] marrow with changes thought to reflect IDA.  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   He is advised to follow-up with GI due to bone marrow findings.  He will have repeat lab in 10/2018 and will be seen for follow-up in 11/2018.    2. Monoclonal gammopathy.  SPEP showed minimal m spike of 0.2 g/d.  Ifix is normal.  He has a normal FLC ratio of 1.44.  SPEP was repeated on 07/10/2018 and was negative. Pt had repeat SPEP done 11/5/209 that was negative.  Bone marrow biopsy done 08/31/2018 showed findings consistent with Iron deficiency.    3.  Thyroid nodule.  I discussed with pt nodule measures 11 mm and he was recommended for thyroid USN.  He does not desire to have that performed.  He should follow-up with PCP or Dr. Luan Pulling for ongoing monitoring.    4  AAA.  This was noted on recent CT and measured 3.4 cm.  Pt should follow-up with PCP for ongoing monitoring.  He was given option of CT surgery referral but does not desire to be referred.    5.  Hypertension.  Blood pressure is 162/65.   Follow-up with PCP.  6.  Smoking.  Patient reports he has discontinued smoking in February 2019.  CT chest done 07/2018 showed no lung abnormalities.  Continue to follow-up with Dr. Luan Pulling of pulmonary for follow-up.    25 minutes spent with more than 50% spent in counseling and coordination of care.    Interval History:  Historical data obtained from the note dated 04/02/2018.  82 year old male referred by Dr. Luan Pulling  for evaluation of anemia.  The patient was seen in the emergency room due to severe anemia.  He reported he had been feeling tired, also dizzy.  He had been seen at [place] and was transferred to [facility] for evaluation.  Labs done showed a white count of 8.6 hemoglobin 6.7 platelets 307,000.  PT INR was within normal limits.  Potassium was 3.8 bicarb was 18 creatinine was 1.18.  Labs done 01/21/2018  showed a white count 11.4 hemoglobin 8.9 platelets 284,000.  MCV was 75.  H&H done 03/03/2018 showed a hemoglobin of 6.  He was last transfused 03/03/2018.  He reports craving ice.  He denies any blood in his stool or his urine.  He had been offered GI evaluation but refused GI evaluation.  He reports he is no longer taking iron supplements.  He denies any family history of leukemia, lymphoma, hemoglobinopathy.  He was a longtime smoker reportedly last smoked in February 2019.  He reports he was hospitalized in [place] for shortness of breath and was intubated.  He was found to have IDA and was treated with IV iron on 03/20/2018 and 03/27/2018.    Labs done 03/16/2018 showed a white count 8.4 hemoglobin 7.7 MCV 72 platelets 388,000. Ferritin was 5, Creatinine is 1.02.   Current Status:  Pt is seen today for follow-up.  He is here to go over bone marrow results.  He is scheduled for GI evaluation with capsule study.    Problem List Patient Active Problem List   Diagnosis Date Noted  . Iron deficiency anemia due to chronic blood loss [D50.0] 03/17/2018  . Open wound of penis [S31.20XA] 01/21/2018  . Symptomatic anemia [D64.9] 01/20/2018  . Hypertension [I10] 01/20/2018  . Hypercholesteremia [E78.00] 01/20/2018  . GERD (gastroesophageal reflux disease) [K21.9] 01/20/2018  . Vitamin B deficiency [E53.9] 01/20/2018    Past Medical History Past Medical History:  Diagnosis Date  . Anemia   . GERD (gastroesophageal reflux disease)   . Hypercholesteremia   . Hypertension   . Iron deficiency anemia due to chronic blood loss 03/17/2018  . Vitamin B deficiency    patient denies    Past Surgical History Past Surgical History:  Procedure Laterality Date  . BIOPSY  06/24/2018   Procedure: BIOPSY;  Surgeon: Daneil Dolin, MD;  [facility];  Service: Endoscopy;;  gastric   . COLONOSCOPY N/A 06/24/2018   Dr. Gala Romney: Internal hemorrhoids, diverticulosis, 9 mm polyp removed from the sigmoid  colon which was hyperplastic.  Marland Kitchen ESOPHAGOGASTRODUODENOSCOPY N/A 06/24/2018   Dr. Gala Romney: Multiple erosions in the stomach and duodenal bulb, gastric biopsies with chronic gastritis with intestinal metaplasia, no H. pylori.  . none    . POLYPECTOMY  06/24/2018   Procedure: POLYPECTOMY;  Surgeon: Daneil Dolin, MD;  Location: [facility];  Service: Endoscopy;;  sigmoid polyp hs    Family History Family History  Problem Relation Age of Onset  . Colon cancer Neg Hx      Social History  reports that he quit smoking about 8 months ago. His smoking use included cigarettes. He has a 33.50 pack-year smoking history. He has never used smokeless tobacco. He reports that he does not drink alcohol or use drugs.  Medications  Current Outpatient Medications:  .  amLODipine (NORVASC) 5 MG tablet, Take 5 mg by mouth daily., Disp: , Rfl:  .  atorvastatin (LIPITOR) 10 MG tablet, Take 10 mg by mouth daily with supper. , Disp: ,  Rfl:  .  carvedilol (COREG) 12.5 MG tablet, Take 12.5 mg by mouth 2 (two) times daily with a meal., Disp: , Rfl:  .  ferrous sulfate 325 (65 FE) MG tablet, Take 1 tablet (325 mg total) by mouth daily., Disp: 30 tablet, Rfl: 0 .  multivitamin-iron-minerals-folic acid (CENTRUM) chewable tablet, Chew 1 tablet by mouth daily., Disp: , Rfl:  .  pantoprazole (PROTONIX) 40 MG tablet, Take 40 mg by mouth daily. , Disp: , Rfl:  .  triamcinolone cream (KENALOG) 0.1 %, Apply 1 application topically daily as needed (for rash)., Disp: , Rfl:  .  vitamin C (ASCORBIC ACID) 500 MG tablet, Take 500 mg by mouth daily., Disp: , Rfl:  .  vitamin E 100 UNIT capsule, Take 100 Units by mouth daily. , Disp: , Rfl:   Allergies Patient has no known allergies.  Review of Systems Review of Systems - Oncology ROS negative   Physical Exam  Vitals Wt Readings from Last 3 Encounters:  09/10/18 141 lb 11.2 oz (64.3 kg)  09/04/18 141 lb 9.6 oz (64.2 kg)  08/25/18 142 lb 12.8 oz (64.8 kg)   Temp  Readings from Last 3 Encounters:  09/10/18 97.6 F (36.4 C) (Oral)  09/04/18 97.9 F (36.6 C) (Oral)  08/31/18 97.7 F (36.5 C) (Oral)   BP Readings from Last 3 Encounters:  09/10/18 (!) 162/65  09/04/18 (!) 157/69  08/31/18 108/80   Pulse Readings from Last 3 Encounters:  09/10/18 86  09/04/18 88  08/31/18 79   Constitutional: Well-developed, well-nourished, and in no distress.   HENT: Head: Normocephalic and atraumatic.  Mouth/Throat: No oropharyngeal exudate. Mucosa moist. Eyes: Pupils are equal, round, and reactive to light. Conjunctivae are normal. No scleral icterus.  Neck: Normal range of motion. Neck supple. No JVD present.  Cardiovascular: Normal rate, regular rhythm and normal heart sounds.  Exam reveals no gallop and no friction rub.   No murmur heard. Pulmonary/Chest: Effort normal and breath sounds normal. No respiratory distress. No wheezes.No rales.  Abdominal: Soft. Bowel sounds are normal. No distension. There is no tenderness. There is no guarding.  Musculoskeletal: No edema or tenderness.  Lymphadenopathy: No cervical, axillary or supraclavicular adenopathy.  Neurological: Alert and oriented to person, place, and time. No cranial nerve deficit.  Skin: Skin is warm and dry. No rash noted. No erythema. No pallor.  Psychiatric: Affect and judgment normal.   Labs Appointment on 09/08/2018  Component Date Value Ref Range Status  . WBC 09/08/2018 6.7  4.0 - 10.5 K/uL Final   WHITE COUNT CONFIRMED ON SMEAR  . RBC 09/08/2018 4.00* 4.22 - 5.81 MIL/uL Final  . Hemoglobin 09/08/2018 9.3* 13.0 - 17.0 g/dL Final  . HCT 09/08/2018 34.5* 39.0 - 52.0 % Final  . MCV 09/08/2018 86.3  80.0 - 100.0 fL Final  . MCH 09/08/2018 23.3* 26.0 - 34.0 pg Final  . MCHC 09/08/2018 27.0* 30.0 - 36.0 g/dL Final  . RDW 09/08/2018 28.7* 11.5 - 15.5 % Final  . Platelets 09/08/2018 327  150 - 400 K/uL Final  . nRBC 09/08/2018 0.0  0.0 - 0.2 % Final  . Neutrophils Relative % 09/08/2018 71   % Final  . Neutro Abs 09/08/2018 4.7  1.7 - 7.7 K/uL Final  . Lymphocytes Relative 09/08/2018 12  % Final  . Lymphs Abs 09/08/2018 0.8  0.7 - 4.0 K/uL Final  . Monocytes Relative 09/08/2018 13  % Final  . Monocytes Absolute 09/08/2018 0.9  0.1 - 1.0 K/uL Final  .  Eosinophils Relative 09/08/2018 3  % Final  . Eosinophils Absolute 09/08/2018 0.2  0.0 - 0.5 K/uL Final  . Basophils Relative 09/08/2018 1  % Final  . Basophils Absolute 09/08/2018 0.1  0.0 - 0.1 K/uL Final  . RBC Morphology 09/08/2018 ANISOCYTOSIS PRESENT   Final  . Immature Granulocytes 09/08/2018 0  % Final  . Abs Immature Granulocytes 09/08/2018 0.02  0.00 - 0.07 K/uL Final  . Schistocytes 09/08/2018 PRESENT   Final  . Polychromasia 09/08/2018 PRESENT   Final  . Target Cells 09/08/2018 PRESENT   Final   Performed at [facility], [address] [place], [place] [address]  . Sodium 09/08/2018 140  135 - 145 mmol/L Final  . Potassium 09/08/2018 3.8  3.5 - 5.1 mmol/L Final  . Chloride 09/08/2018 112* 98 - 111 mmol/L Final  . CO2 09/08/2018 22  22 - 32 mmol/L Final  . Glucose, Bld 09/08/2018 101* 70 - 99 mg/dL Final  . BUN 09/08/2018 13  8 - 23 mg/dL Final  . Creatinine, Ser 09/08/2018 0.85  0.61 - 1.24 mg/dL Final  . Calcium 09/08/2018 8.4* 8.9 - 10.3 mg/dL Final  . Total Protein 09/08/2018 6.6  6.5 - 8.1 g/dL Final  . Albumin 09/08/2018 3.6  3.5 - 5.0 g/dL Final  . AST 09/08/2018 26  15 - 41 U/L Final  . ALT 09/08/2018 15  0 - 44 U/L Final  . Alkaline Phosphatase 09/08/2018 84  38 - 126 U/L Final  . Total Bilirubin 09/08/2018 0.3  0.3 - 1.2 mg/dL Final  . GFR calc non Af Amer 09/08/2018 >60  >60 mL/min Final  . GFR calc Af Amer 09/08/2018 >60  >60 mL/min Final   Comment: (NOTE) The eGFR has been calculated using the CKD EPI equation. This calculation has not been validated in all clinical situations. eGFR's persistently <60 mL/min signify possible Chronic Kidney Disease.   Georgiann Hahn gap 09/08/2018 6  5 - 15 Final    Performed at [facility], [address] [place], [place] [address]  . LDH 09/08/2018 143  98 - 192 U/L Final   Performed at [facility], [address], [place], [place] [address]  . Ferritin 09/08/2018 1,032* 24 - 336 ng/mL Final   Performed at [facility], [address] [place], [place] [address]  . Total Protein ELP 09/08/2018 5.9* 6.0 - 8.5 g/dL Final  . Albumin ELP 09/08/2018 3.2  2.9 - 4.4 g/dL Final  . Alpha-1-Globulin 09/08/2018 0.3  0.0 - 0.4 g/dL Final  . Alpha-2-Globulin 09/08/2018 0.8  0.4 - 1.0 g/dL Final  . Beta Globulin 09/08/2018 0.8  0.7 - 1.3 g/dL Final  . Gamma Globulin 09/08/2018 0.7  0.4 - 1.8 g/dL Final  . M-Spike, % 09/08/2018 Not Observed  Not Observed g/dL Final  . SPE Interp. 09/08/2018 Comment   Final   Comment: (NOTE) The SPE pattern appears essentially unremarkable. Evidence of monoclonal protein is not apparent. Performed At: [facility] [place], [place] [id number] Rush Farmer MD ID:[id number]   . Comment 09/08/2018 Comment   Final   Comment: (NOTE) Protein electrophoresis scan will follow via computer, mail, or courier delivery.   Marland Kitchen GLOBULIN, TOTAL 09/08/2018 2.7  2.2 - 3.9 g/dL Corrected  . A/G Ratio 09/08/2018 1.2  0.7 - 1.7 Corrected  . Blood Bank Specimen 09/08/2018 SAMPLE AVAILABLE FOR TESTING   Final  . Sample Expiration 09/08/2018    Final  Value:09/09/2018 Performed at [facility], [address], [place], [place] [address]      Pathology Orders Placed This Encounter  Procedures  . CBC with Differential/Platelet    Standing Status:   Future    Standing Expiration Date:   09/11/2019  . Comprehensive metabolic panel    Standing Status:   Future    Standing Expiration Date:   09/11/2019  . Lactate dehydrogenase    Standing Status:   Future    Standing Expiration Date:   09/11/2019  . Ferritin    Standing Status:   Future    Standing Expiration Date:   09/11/2019  . Haptoglobin    Standing  Status:   Future    Standing Expiration Date:   09/11/2019  . CBC with Differential/Platelet    Standing Status:   Future    Standing Expiration Date:   09/10/2020  . Comprehensive metabolic panel    Standing Status:   Future    Standing Expiration Date:   09/10/2020  . Lactate dehydrogenase    Standing Status:   Future    Standing Expiration Date:   09/10/2020  . Ferritin    Standing Status:   Future    Standing Expiration Date:   09/10/2020  . Protein electrophoresis, serum    Standing Status:   Future    Standing Expiration Date:   09/10/2020       Zoila Shutter MD

## 2018-09-10 NOTE — Patient Instructions (Signed)
[facility]  Discharge Instructions:   _______________________________________________________________  Thank you for choosing [facility] to provide your oncology and hematology care.  To afford each patient quality time with our providers, please arrive at least 15 minutes before your scheduled appointment.  You need to re-schedule your appointment if you arrive 10 or more minutes late.  We strive to give you quality time with our providers, and arriving late affects you and other patients whose appointments are after yours.  Also, if you no show three or more times for appointments you may be dismissed from the clinic.  Again, thank you for choosing [facility]. Our hope is that these requests will allow you access to exceptional care and in a timely manner. _______________________________________________________________  If you have questions after your visit, please contact our office at [phone] between the hours of 8:30 a.m. and 5:00 p.m. Voicemails left after 4:30 p.m. will not be returned until the following business day. _______________________________________________________________  For prescription refill requests, have your pharmacy contact our office. _______________________________________________________________  Recommendations made by the consultant and any test results will be sent to your referring physician. _______________________________________________________________ 

## 2018-09-15 ENCOUNTER — Encounter (HOSPITAL_COMMUNITY)
Admission: RE | Disposition: A | Discharge status: Home / Self Care | Payer: Self-pay | Source: Ambulatory Visit | Attending: Internal Medicine

## 2018-09-15 ENCOUNTER — Ambulatory Visit (HOSPITAL_COMMUNITY)
Admission: RE | Admit: 2018-09-15 | Discharge: 2018-09-15 | Disposition: A | Discharge status: Home / Self Care | Payer: PPO | Source: Ambulatory Visit | Attending: Internal Medicine | Admitting: Internal Medicine

## 2018-09-15 DIAGNOSIS — Z79899 Other long term (current) drug therapy: Secondary | ICD-10-CM | POA: Insufficient documentation

## 2018-09-15 DIAGNOSIS — D500 Iron deficiency anemia secondary to blood loss (chronic): Secondary | ICD-10-CM | POA: Insufficient documentation

## 2018-09-15 DIAGNOSIS — K219 Gastro-esophageal reflux disease without esophagitis: Secondary | ICD-10-CM | POA: Diagnosis not present

## 2018-09-15 DIAGNOSIS — E041 Nontoxic single thyroid nodule: Secondary | ICD-10-CM | POA: Insufficient documentation

## 2018-09-15 DIAGNOSIS — K922 Gastrointestinal hemorrhage, unspecified: Secondary | ICD-10-CM | POA: Insufficient documentation

## 2018-09-15 DIAGNOSIS — D649 Anemia, unspecified: Secondary | ICD-10-CM | POA: Diagnosis not present

## 2018-09-15 DIAGNOSIS — I714 Abdominal aortic aneurysm, without rupture: Secondary | ICD-10-CM | POA: Insufficient documentation

## 2018-09-15 DIAGNOSIS — K259 Gastric ulcer, unspecified as acute or chronic, without hemorrhage or perforation: Secondary | ICD-10-CM | POA: Diagnosis not present

## 2018-09-15 DIAGNOSIS — D472 Monoclonal gammopathy: Secondary | ICD-10-CM | POA: Diagnosis not present

## 2018-09-15 DIAGNOSIS — Z87891 Personal history of nicotine dependence: Secondary | ICD-10-CM | POA: Insufficient documentation

## 2018-09-15 DIAGNOSIS — I10 Essential (primary) hypertension: Secondary | ICD-10-CM | POA: Diagnosis not present

## 2018-09-15 HISTORY — PX: GIVENS CAPSULE STUDY: SHX5432

## 2018-09-15 SURGERY — IMAGING PROCEDURE, GI TRACT, INTRALUMINAL, VIA CAPSULE

## 2018-09-16 ENCOUNTER — Other Ambulatory Visit: Payer: Self-pay

## 2018-09-16 ENCOUNTER — Encounter (HOSPITAL_COMMUNITY)
Admission: RE | Admit: 2018-09-16 | Discharge: 2018-09-16 | Disposition: A | Discharge status: Home / Self Care | Payer: PPO | Source: Ambulatory Visit | Attending: Internal Medicine | Admitting: Internal Medicine

## 2018-09-16 ENCOUNTER — Telehealth: Payer: Self-pay | Admitting: Gastroenterology

## 2018-09-16 DIAGNOSIS — K254 Chronic or unspecified gastric ulcer with hemorrhage: Secondary | ICD-10-CM

## 2018-09-16 DIAGNOSIS — D500 Iron deficiency anemia secondary to blood loss (chronic): Secondary | ICD-10-CM

## 2018-09-16 DIAGNOSIS — K922 Gastrointestinal hemorrhage, unspecified: Secondary | ICD-10-CM

## 2018-09-16 DIAGNOSIS — Q2730 Arteriovenous malformation, site unspecified: Secondary | ICD-10-CM

## 2018-09-16 NOTE — Telephone Encounter (Signed)
Pt notified of results. Please contact pts spouse on home number to schedule procedure.

## 2018-09-16 NOTE — Telephone Encounter (Signed)
EGD w/Propofol w/RMR scheduled for 09/17/18 at 11:00am. Spoke to endo scheduler. Pt to arrive at 9:30am. Pre-op nurse will call pt this afternoon. Called and spoke to pt's spouse. Informed her of procedure tomorrow. EGD instructions given to her. Verbalized understanding. Orders entered.

## 2018-09-16 NOTE — Op Note (Signed)
  Small Bowel [place] Capsule Study Procedure date: 09/15/2018  Referring Provider:  Garfield Cornea, MD  PCP:  Dr. Sinda Du, MD  Indication for procedure: 82 year old male with transfusion dependent iron deficiency anemia.  EGD and colonoscopy in August 19 showed multiple erosions in the stomach and duodenal bulb, benign gastric biopsies, diverticulosis of the colon, internal hemorrhoids but otherwise unremarkable.  Followed by hematology, recent bone marrow biopsy performed due to ongoing requirements of blood transfusions/iron. Per hematology "[place] marrow with changes thought to reflect IDA.  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   He is advised to follow-up with GI due to bone marrow findings." No report of recent overt GI bleeding.  He was Hemoccult negative in July.    Patient data:  Wt: 141 pounds Ht: 5 foot 7 inches   Findings: Patient swallowed capsule without any difficulty.  There were several erosions seen in the stomach, fresh blood also noted.  Pertinent images at 9 minutes 11 seconds, 14 minutes 52 seconds, 14 minutes 53 seconds, 15 minutes 9 seconds, 15 minutes 13 seconds  Capsule passed quickly through the small bowel 24 minutes.  Fresh blood noted 21 minutes 49 seconds.  Suspected AVMs at 21 minutes 56 seconds, 22 minutes 6 seconds, 23 minutes 21 seconds, 23 minutes 59 seconds.  Suspected erosions at 24 minutes 4 seconds, 24 minutes 11 seconds.  Blood noted between the folds at 25 minutes 10 seconds, 25 minutes 48 seconds, 27 minutes 30 seconds.  Suspected AVM 29 minutes 5 seconds. Erythematous/inflamed focal area 29 minutes 31 seconds.  First Gastric image: 50 seconds First Duodenal image: 21 minutes 45 seconds First Ileo-Cecal Valve image: 45 minutes 29 seconds First Cecal image: 46 minutes 32 seconds Gastric Passage time: 20 minutes Small Bowel Passage time: 24 minutes  Summary &  Recommendations: 82 year old gentleman with history of iron deficiency anemia requiring persistent blood transfusions/iron infusions.  Bone marrow biopsy thought to reflect IDA.  On this study he had fresh blood within the stomach as well as the proximal small bowel.  Small bowel transit was rapid.  Multiple erosions noted throughout the stomach.  Source of fresh blood within the stomach not identified.  Multiple questionable AVMs and erosions throughout the small bowel.  Patient does not take any NSAIDs, aspirin, blood thinners.  Patient would benefit from repeat upper endoscopy with pediatric colonoscopy asap given active bleeding. Discussed findings with Dr. Gala Romney.   Laureen Ochs. Bernarda C[facility] [phone] 11/13/20191:21 PM

## 2018-09-16 NOTE — Telephone Encounter (Signed)
Please let patient know that his capsule study showed active bleeding in the stomach and proximal small bowel. 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   Discussed with Dr. Gala Romney: recommends EGD with pediatric colonoscopy TOMORROW WITH PROPOFOL.

## 2018-09-17 ENCOUNTER — Ambulatory Visit (HOSPITAL_COMMUNITY): Payer: PPO | Admitting: Anesthesiology

## 2018-09-17 ENCOUNTER — Encounter (HOSPITAL_COMMUNITY): Payer: Self-pay | Admitting: *Deleted

## 2018-09-17 ENCOUNTER — Ambulatory Visit (HOSPITAL_COMMUNITY)
Admission: RE | Admit: 2018-09-17 | Discharge: 2018-09-17 | Disposition: A | Discharge status: Home / Self Care | Payer: PPO | Source: Ambulatory Visit | Attending: Internal Medicine | Admitting: Internal Medicine

## 2018-09-17 ENCOUNTER — Encounter (HOSPITAL_COMMUNITY)
Admission: RE | Disposition: A | Discharge status: Home / Self Care | Payer: Self-pay | Source: Ambulatory Visit | Attending: Internal Medicine

## 2018-09-17 DIAGNOSIS — K219 Gastro-esophageal reflux disease without esophagitis: Secondary | ICD-10-CM | POA: Insufficient documentation

## 2018-09-17 DIAGNOSIS — K922 Gastrointestinal hemorrhage, unspecified: Secondary | ICD-10-CM | POA: Diagnosis not present

## 2018-09-17 DIAGNOSIS — Q2730 Arteriovenous malformation, site unspecified: Secondary | ICD-10-CM

## 2018-09-17 DIAGNOSIS — D500 Iron deficiency anemia secondary to blood loss (chronic): Secondary | ICD-10-CM | POA: Diagnosis not present

## 2018-09-17 DIAGNOSIS — Q2733 Arteriovenous malformation of digestive system vessel: Secondary | ICD-10-CM | POA: Diagnosis not present

## 2018-09-17 DIAGNOSIS — I10 Essential (primary) hypertension: Secondary | ICD-10-CM | POA: Insufficient documentation

## 2018-09-17 DIAGNOSIS — Z79899 Other long term (current) drug therapy: Secondary | ICD-10-CM | POA: Diagnosis not present

## 2018-09-17 DIAGNOSIS — Z7982 Long term (current) use of aspirin: Secondary | ICD-10-CM | POA: Insufficient documentation

## 2018-09-17 DIAGNOSIS — Z8719 Personal history of other diseases of the digestive system: Secondary | ICD-10-CM | POA: Diagnosis not present

## 2018-09-17 DIAGNOSIS — Z87891 Personal history of nicotine dependence: Secondary | ICD-10-CM | POA: Diagnosis not present

## 2018-09-17 DIAGNOSIS — E7800 Pure hypercholesterolemia, unspecified: Secondary | ICD-10-CM | POA: Diagnosis not present

## 2018-09-17 DIAGNOSIS — Z86010 Personal history of colonic polyps: Secondary | ICD-10-CM | POA: Insufficient documentation

## 2018-09-17 DIAGNOSIS — K254 Chronic or unspecified gastric ulcer with hemorrhage: Secondary | ICD-10-CM

## 2018-09-17 HISTORY — PX: ESOPHAGOGASTRODUODENOSCOPY (EGD) WITH PROPOFOL: SHX5813

## 2018-09-17 SURGERY — ESOPHAGOGASTRODUODENOSCOPY (EGD) WITH PROPOFOL
Anesthesia: Monitor Anesthesia Care

## 2018-09-17 MED ORDER — PROMETHAZINE HCL 25 MG/ML IJ SOLN: 6.2500 mg | INTRAMUSCULAR | Status: DC | PRN

## 2018-09-17 MED ORDER — LACTATED RINGERS IV SOLN
INTRAVENOUS | Status: DC
  Administered 2018-09-17: 11:00:00 via INTRAVENOUS

## 2018-09-17 MED ORDER — CHLORHEXIDINE GLUCONATE CLOTH 2 % EX PADS: 6.0000 | MEDICATED_PAD | Freq: Once | CUTANEOUS | Status: DC

## 2018-09-17 MED ORDER — MEPERIDINE HCL 100 MG/ML IJ SOLN: 6.2500 mg | INTRAMUSCULAR | Status: DC | PRN

## 2018-09-17 MED ORDER — PROPOFOL 500 MG/50ML IV EMUL
INTRAVENOUS | Status: DC | PRN
  Administered 2018-09-17: 150 ug/kg/min via INTRAVENOUS

## 2018-09-17 MED ORDER — HYDROMORPHONE HCL 1 MG/ML IJ SOLN: 0.2500 mg | INTRAMUSCULAR | Status: DC | PRN

## 2018-09-17 MED ORDER — LACTATED RINGERS IV SOLN: INTRAVENOUS | Status: DC

## 2018-09-17 MED ORDER — HYDROCODONE-ACETAMINOPHEN 7.5-325 MG PO TABS: 1.0000 | ORAL_TABLET | Freq: Once | ORAL | Status: DC | PRN

## 2018-09-17 NOTE — Anesthesia Postprocedure Evaluation (Signed)
Anesthesia Post Note  Patient: Jared Schmidt  Procedure(s) Performed: ESOPHAGOGASTRODUODENOSCOPY (EGD) WITH PROPOFOL (N/A )  Patient location during evaluation: PACU Anesthesia Type: MAC Level of consciousness: awake and patient cooperative Pain management: pain level controlled Vital Signs Assessment: post-procedure vital signs reviewed and stable Respiratory status: spontaneous breathing, nonlabored ventilation and respiratory function stable Cardiovascular status: blood pressure returned to baseline Postop Assessment: no apparent nausea or vomiting Anesthetic complications: no     Last Vitals:  Vitals:   09/17/18 0948  BP: (!) 158/72  Pulse: 72  Resp: 18  Temp: 36.5 C  SpO2: 100%    Last Pain:  Vitals:   09/17/18 1125  TempSrc:   PainSc: 0-No pain                 Remy Voiles J

## 2018-09-17 NOTE — Transfer of Care (Signed)
Immediate Anesthesia Transfer of Care Note  Patient: Jared Schmidt  Procedure(s) Performed: ESOPHAGOGASTRODUODENOSCOPY (EGD) WITH PROPOFOL (N/A )  Patient Location: PACU  Anesthesia Type:MAC  Level of Consciousness: awake and patient cooperative  Airway & Oxygen Therapy: Patient Spontanous Breathing  Post-op Assessment: Report given to RN and Post -op Vital signs reviewed and stable  Post vital signs: Reviewed and stable  Last Vitals:  Vitals Value Taken Time  BP    Temp    Pulse 78 09/17/2018 12:01 PM  Resp 17 09/17/2018 12:01 PM  SpO2 100 % 09/17/2018 12:01 PM  Vitals shown include unvalidated device data.  Last Pain:  Vitals:   09/17/18 1125  TempSrc:   PainSc: 0-No pain      Patients Stated Pain Goal: 4 (10/93/23 5573)  Complications: No apparent anesthesia complications

## 2018-09-17 NOTE — Discharge Instructions (Signed)
EGD Discharge instructions Please read the instructions outlined below and refer to this sheet in the next few weeks. These discharge instructions provide you with general information on caring for yourself after you leave the hospital. Your doctor may also give you specific instructions. While your treatment has been planned according to the most current medical practices available, unavoidable complications occasionally occur. If you have any problems or questions after discharge, please call your doctor. ACTIVITY  You may resume your regular activity but move at a slower pace for the next 24 hours.   Take frequent rest periods for the next 24 hours.   Walking will help expel (get rid of) the air and reduce the bloated feeling in your abdomen.   No driving for 24 hours (because of the anesthesia (medicine) used during the test).   You may shower.   Do not sign any important legal documents or operate any machinery for 24 hours (because of the anesthesia used during the test).  NUTRITION  Drink plenty of fluids.   You may resume your normal diet.   Begin with a light meal and progress to your normal diet.   Avoid alcoholic beverages for 24 hours or as instructed by your caregiver.  MEDICATIONS  You may resume your normal medications unless your caregiver tells you otherwise.  WHAT YOU CAN EXPECT TODAY  You may experience abdominal discomfort such as a feeling of fullness or gas pains.  FOLLOW-UP  Your doctor will discuss the results of your test with you.  SEEK IMMEDIATE MEDICAL ATTENTION IF ANY OF THE FOLLOWING OCCUR:  Excessive nausea (feeling sick to your stomach) and/or vomiting.   Severe abdominal pain and distention (swelling).   Trouble swallowing.   Temperature over 101 F (37.8 C).   Rectal bleeding or vomiting of blood.    No future MRI until the stomach clips are gone  Office visit with [place] in 3 months    Monitored Anesthesia Care, Care After These  instructions provide you with information about caring for yourself after your procedure. Your health care provider may also give you more specific instructions. Your treatment has been planned according to current medical practices, but problems sometimes occur. Call your health care provider if you have any problems or questions after your procedure. What can I expect after the procedure? After your procedure, it is common to:  Feel sleepy for several hours.  Feel clumsy and have poor balance for several hours.  Feel forgetful about what happened after the procedure.  Have poor judgment for several hours.  Feel nauseous or vomit.  Have a sore throat if you had a breathing tube during the procedure.  Follow these instructions at home: For at least 24 hours after the procedure:   Do not: ? Participate in activities in which you could fall or become injured. ? Drive. ? Use heavy machinery. ? Drink alcohol. ? Take sleeping pills or medicines that cause drowsiness. ? Make important decisions or sign legal documents. ? Take care of children on your own.  Rest. Eating and drinking  Follow the diet that is recommended by your health care provider.  If you vomit, drink water, juice, or soup when you can drink without vomiting.  Make sure you have little or no nausea before eating solid foods. General instructions  Have a responsible adult stay with you until you are awake and alert.  Take over-the-counter and prescription medicines only as told by your health care provider.  If you smoke,  do not smoke without supervision.  Keep all follow-up visits as told by your health care provider. This is important. Contact a health care provider if:  You keep feeling nauseous or you keep vomiting.  You feel light-headed.  You develop a rash.  You have a fever. Get help right away if:  You have trouble breathing. This information is not intended to replace advice given to you  by your health care provider. Make sure you discuss any questions you have with your health care provider. Document Released: 02/11/2016 Document Revised: 06/12/2016 Document Reviewed: 02/11/2016 Elsevier Interactive Patient Education  [employer].

## 2018-09-17 NOTE — H&P (Signed)
@LOGO @   Primary Care Physician:  Sinda Du, MD Primary Gastroenterologist:  Dr. Gala Romney  Pre-Procedure History & Physical: HPI:  Jared Schmidt is a 82 y.o. male here for further evaluation of overt bleeding of obscure etiology history of IDA.  Recent capsule study showed fresh blood in the stomach proximal small bowel.  He is here for EGD and enteroscopy via pediatric colonoscope.  Past Medical History:  Diagnosis Date  . Anemia   . GERD (gastroesophageal reflux disease)   . Hypercholesteremia   . Hypertension   . Iron deficiency anemia due to chronic blood loss 03/17/2018  . Vitamin B deficiency    patient denies    Past Surgical History:  Procedure Laterality Date  . BIOPSY  06/24/2018   Procedure: BIOPSY;  Surgeon: Daneil Dolin, MD;  Location: [facility];  Service: Endoscopy;;  gastric   . COLONOSCOPY N/A 06/24/2018   Dr. Gala Romney: Internal hemorrhoids, diverticulosis, 9 mm polyp removed from the sigmoid colon which was hyperplastic.  Marland Kitchen ESOPHAGOGASTRODUODENOSCOPY N/A 06/24/2018   Dr. Gala Romney: Multiple erosions in the stomach and duodenal bulb, gastric biopsies with chronic gastritis with intestinal metaplasia, no H. pylori.  . none    . POLYPECTOMY  06/24/2018   Procedure: POLYPECTOMY;  Surgeon: Daneil Dolin, MD;  Location: [facility];  Service: Endoscopy;;  sigmoid polyp hs    Prior to Admission medications   Medication Sig Start Date End Date Taking? Authorizing Provider  alfuzosin (UROXATRAL) 10 MG 24 hr tablet Take 10 mg by mouth at bedtime. 09/09/18  Yes [provider]  amLODipine (NORVASC) 5 MG tablet Take 5 mg by mouth daily.   Yes [provider]  aspirin EC 81 MG tablet Take 81 mg by mouth daily.   Yes [provider]  atorvastatin (LIPITOR) 10 MG tablet Take 10 mg by mouth daily with supper.    Yes [provider]  carvedilol (COREG) 12.5 MG tablet Take 12.5 mg by mouth 2 (two) times daily with a meal.   Yes [provider]  clotrimazole-betamethasone (LOTRISONE) cream Apply 1 application topically 2 (two) times daily. 09/09/18  Yes [provider]  Multiple Vitamins-Minerals (CENTRUM SILVER PO) Take 1 tablet by mouth daily.   Yes [provider]  pantoprazole (PROTONIX) 40 MG tablet Take 40 mg by mouth daily.    Yes [provider]  vitamin C (ASCORBIC ACID) 500 MG tablet Take 500 mg by mouth daily.   Yes [provider]  vitamin E 100 UNIT capsule Take 100 Units by mouth daily.    Yes [provider]  ferrous sulfate 325 (65 FE) MG tablet Take 1 tablet (325 mg total) by mouth daily. Patient not taking: Reported on 09/16/2018 08/01/18   Isla Pence, MD    Allergies as of 09/16/2018  . (No Known Allergies)    Family History  Problem Relation Age of Onset  . Colon cancer Neg Hx     Social History   Socioeconomic History  . Marital status: Married    Spouse name: Deontrae Drinkard  . Number of children: Not on file  . Years of education: Not on file  . Highest education level: 8th grade  Occupational History  . Not on file  Social Needs  . Financial resource strain: Not on file  . Food insecurity:    Worry: Not on file    Inability: Not on file  . Transportation needs:    Medical: Not on file  Non-medical: Not on file  Tobacco Use  . Smoking status: Former Smoker    Packs/day: 0.50    Years: 67.00    Pack years: 33.50    Types: Cigarettes    Last attempt to quit: 12/11/2017    Years since quitting: 0.7  . Smokeless tobacco: Never Used  Substance and Sexual Activity  . Alcohol use: No    Frequency: Never  . Drug use: No  . Sexual activity: Not Currently  Lifestyle  . Physical activity:    Days per week: Not on file    Minutes per session: Not on file  . Stress: Not on file  Relationships  . Social connections:    Talks on phone: Not on file    Gets together: Not on file    Attends religious service: Not on file    Active  member of club or organization: Not on file    Attends meetings of clubs or organizations: Not on file    Relationship status: Not on file  . Intimate partner violence:    Fear of current or ex partner: Not on file    Emotionally abused: Not on file    Physically abused: Not on file    Forced sexual activity: Not on file  Other Topics Concern  . Not on file  Social History Narrative  . Not on file    Review of Systems: See HPI, otherwise negative ROS  Physical Exam: BP (!) 158/72   Pulse 72   Temp 97.7 F (36.5 C) (Oral)   Resp 18   Ht 5\' 7"  (1.702 m)   Wt 64 kg   SpO2 100%   BMI 22.08 kg/m  General:   Alert,  Well-developed, well-nourished, pleasant and cooperative in NAD Neck:  Supple; no masses or thyromegaly. No significant cervical adenopathy. Lungs:  Clear throughout to auscultation.   No wheezes, crackles, or rhonchi. No acute distress. Heart:  Regular rate and rhythm; no murmurs, clicks, rubs,  or gallops. Abdomen: Non-distended, normal bowel sounds.  Soft and nontender without appreciable mass or hepatosplenomegaly.  Pulses:  Normal pulses noted. Extremities:  Without clubbing or edema.  Impression/Plan: 82 year old gentleman with recurrent GI bleeding/IDA.  Capsule study positive for fresh blood in the stomach and proximal small bowel.  Etiology uncertain.  EGD now being done with the pediatric colonoscope try to further elucidate cause of bleeding.  Potential therapeutic intervention as appropriate. The risks, benefits, limitations, alternatives and imponderables have been reviewed with the patient. Potential for esophageal dilation, biopsy, etc. have also been reviewed.  Questions have been answered. All parties agreeable.     Notice: This dictation was prepared with Dragon dictation along with smaller phrase technology. Any transcriptional errors that result from this process are unintentional and may not be corrected upon review.

## 2018-09-17 NOTE — Op Note (Signed)
[facility] Patient Name: Jared Schmidt Procedure Date: 09/17/2018 11:26 AM MRN: [id number] Date of Birth: 15-Dec-1935 Attending MD: Norvel Richards , MD CSN: [id number] Age: 82 Admit Type: Outpatient Procedure:                Upper GI endoscopy Indications:              Arteriovenous malformation in the stomach, Recent                            gastrointestinal bleeding; fresh blood in stomach                            on capsule study. Providers:                Norvel Richards, MD, Janeece Riggers, RN, Nelma Rothman, Technician Referring MD:              Medicines:                Propofol per Anesthesia Complications:            No immediate complications. Estimated Blood Loss:     Estimated blood loss: none. Procedure:                Pre-Anesthesia Assessment:                           - Prior to the procedure, a History and Physical                            was performed, and patient medications and                            allergies were reviewed. The patient's tolerance of                            previous anesthesia was also reviewed. The risks                            and benefits of the procedure and the sedation                            options and risks were discussed with the patient.                            All questions were answered, and informed consent                            was obtained. Prior Anticoagulants: The patient has                            taken no previous anticoagulant or antiplatelet                            agents.  ASA Grade Assessment: III - A patient with                            severe systemic disease. After reviewing the risks                            and benefits, the patient was deemed in                            satisfactory condition to undergo the procedure.                           After obtaining informed consent, the endoscope was                            passed under direct  vision. Throughout the                            procedure, the patient's blood pressure, pulse, and                            oxygen saturations were monitored continuously. The                            [id number]-[id number] ([id number]) scope was introduced through                            the and advanced to the third part of duodenum. The                            upper GI endoscopy was accomplished without                            difficulty. The patient tolerated the procedure                            well. Scope In: 11:30:08 AM Scope Out: 11:52:06 AM Total Procedure Duration: 0 hours 21 minutes 58 seconds  Findings:      The examined esophagus was normal.      Red blood was found in the entire examined stomach. 3 discrete areas of       active oozing seen on the gastric wall and antrum. Please see       photographs. Attempted thermally sealing 1 with cold probe. This did not       work. Subsequently I placed hemostasis clips - one on the antrum, one on       the lesion on the anterior wall and 4 to achieve hemostasis on the       lesion on the posterior wall.      The duodenal bulb, second portion of the duodenum and third portion of       the duodenum were normal. Impression:               - Normal esophagus.                           -  Red blood in the entire stomach. 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                           - No specimens collected. Moderate Sedation:      Moderate (conscious) sedation was personally administered by an       anesthesia professional. The following parameters were monitored: oxygen       saturation, heart rate, blood pressure, respiratory rate, EKG, adequacy       of pulmonary ventilation, and response  to care. Recommendation:           - Patient has a contact number available for                            emergencies. The signs and symptoms of potential                            delayed complications were discussed with the                            patient. Return to normal activities tomorrow.                            Written discharge instructions were provided to the                            patient.                           - Advance diet as tolerated.                           - Continue present medications.                           - No repeat upper endoscopy.                           - Return to GI office in 3 months. No future MRI                            until clips gone Procedure Code(s):        --- Professional ---  50722, Esophagogastroduodenoscopy, flexible,                            transoral; diagnostic, including collection of                            specimen(s) by brushing or washing, when performed                            (separate procedure) Diagnosis Code(s):        --- Professional ---                           K92.2, Gastrointestinal hemorrhage, unspecified                           Q27.33, Arteriovenous malformation of digestive                            system vessel CPT copyright 2018 American [employer] Association. All rights reserved. The codes documented in this report are preliminary and upon coder review may  be revised to meet current compliance requirements. Cristopher Estimable. Rourk, MD Norvel Richards, MD 09/17/2018 12:15:11 PM This report has been signed electronically. Number of Addenda: 0

## 2018-09-17 NOTE — Anesthesia Preprocedure Evaluation (Signed)
Anesthesia Evaluation    Airway Mallampati: II       Dental  (+) Edentulous Lower, Edentulous Upper   Pulmonary former smoker,    breath sounds clear to auscultation       Cardiovascular hypertension, On Medications  Rhythm:regular     Neuro/Psych    GI/Hepatic GERD  Medicated,  Endo/Other    Renal/GU      Musculoskeletal   Abdominal   Peds  Hematology  (+) Blood dyscrasia, anemia ,   Anesthesia Other Findings Givens capsule two days ago  Followup EGD today Stopped toabacco use Feb 2019  Reproductive/Obstetrics                             Anesthesia Physical Anesthesia Plan  ASA: III  Anesthesia Plan: MAC   Post-op Pain Management:    Induction:   PONV Risk Score and Plan:   Airway Management Planned:   Additional Equipment:   Intra-op Plan:   Post-operative Plan:   Informed Consent:   Plan Discussed with: Anesthesiologist  Anesthesia Plan Comments:         Anesthesia Quick Evaluation

## 2018-09-18 ENCOUNTER — Encounter (HOSPITAL_COMMUNITY): Payer: Self-pay | Admitting: Internal Medicine

## 2018-09-23 ENCOUNTER — Encounter (HOSPITAL_COMMUNITY): Payer: Self-pay | Admitting: Internal Medicine

## 2018-10-06 ENCOUNTER — Inpatient Hospital Stay (HOSPITAL_COMMUNITY): Payer: PPO | Attending: Hematology

## 2018-10-06 ENCOUNTER — Ambulatory Visit (HOSPITAL_COMMUNITY): Payer: PPO | Admitting: Internal Medicine

## 2018-10-06 ENCOUNTER — Ambulatory Visit: Payer: PPO | Admitting: Gastroenterology

## 2018-10-06 DIAGNOSIS — D509 Iron deficiency anemia, unspecified: Secondary | ICD-10-CM | POA: Diagnosis not present

## 2018-10-06 DIAGNOSIS — D508 Other iron deficiency anemias: Secondary | ICD-10-CM

## 2018-10-06 LAB — COMPREHENSIVE METABOLIC PANEL
ALT: 15 U/L (ref 0–44)
AST: 23 U/L (ref 15–41)
Albumin: 3.9 g/dL (ref 3.5–5.0)
Alkaline Phosphatase: 88 U/L (ref 38–126)
Anion gap: 5 (ref 5–15)
BUN: 16 mg/dL (ref 8–23)
CHLORIDE: 111 mmol/L (ref 98–111)
CO2: 26 mmol/L (ref 22–32)
CREATININE: 0.93 mg/dL (ref 0.61–1.24)
Calcium: 9 mg/dL (ref 8.9–10.3)
GFR calc Af Amer: >60 mL/min (ref >60)
GFR calc non Af Amer: >60 mL/min (ref >60)
Glucose, Bld: 120 mg/dL — ABNORMAL HIGH (ref 70–99)
POTASSIUM: 4.4 mmol/L (ref 3.5–5.1)
SODIUM: 142 mmol/L (ref 135–145)
Total Bilirubin: 0.1 mg/dL — ABNORMAL LOW (ref 0.3–1.2)
Total Protein: 6.9 g/dL (ref 6.5–8.1)

## 2018-10-06 LAB — CBC WITH DIFFERENTIAL/PLATELET
ABS IMMATURE GRANULOCYTES: 0.02 10*3/uL (ref 0.00–0.07)
BASOS PCT: 1 %
Basophils Absolute: 0.1 10*3/uL (ref 0.0–0.1)
EOS ABS: 0.2 10*3/uL (ref 0.0–0.5)
Eosinophils Relative: 3 %
HCT: 37.7 % — ABNORMAL LOW (ref 39.0–52.0)
Hemoglobin: 10.5 g/dL — ABNORMAL LOW (ref 13.0–17.0)
IMMATURE GRANULOCYTES: 0 %
Lymphocytes Relative: 11 %
Lymphs Abs: 0.7 10*3/uL (ref 0.7–4.0)
MCH: 25.4 pg — ABNORMAL LOW (ref 26.0–34.0)
MCHC: 27.9 g/dL — ABNORMAL LOW (ref 30.0–36.0)
MCV: 91.1 fL (ref 80.0–100.0)
Monocytes Absolute: 0.7 10*3/uL (ref 0.1–1.0)
Monocytes Relative: 11 %
NEUTROS ABS: 4.7 10*3/uL (ref 1.7–7.7)
NEUTROS PCT: 74 %
NRBC: 0 % (ref 0.0–0.2)
PLATELETS: 299 10*3/uL (ref 150–400)
RBC: 4.14 MIL/uL — AB (ref 4.22–5.81)
RDW: 19.9 % — AB (ref 11.5–15.5)
WBC: 6.4 10*3/uL (ref 4.0–10.5)

## 2018-10-06 LAB — FERRITIN: FERRITIN: 90 ng/mL (ref 24–336)

## 2018-10-06 LAB — LACTATE DEHYDROGENASE: LDH: 139 U/L (ref 98–192)

## 2018-10-13 ENCOUNTER — Telehealth (HOSPITAL_COMMUNITY): Payer: Self-pay | Admitting: Surgery

## 2018-10-13 NOTE — Telephone Encounter (Signed)
Notified pt of blood work results-per Dr. Walden Field, the pt's hemoglobin was 10.5 which is stable, and his iron (ferritin) level was 90 which is normal.  Pt verbalized understanding, and was told to call our office if he had any questions.

## 2018-10-14 ENCOUNTER — Ambulatory Visit (INDEPENDENT_AMBULATORY_CARE_PROVIDER_SITE_OTHER): Payer: PPO | Admitting: Urology

## 2018-10-14 DIAGNOSIS — N401 Enlarged prostate with lower urinary tract symptoms: Secondary | ICD-10-CM

## 2018-10-14 DIAGNOSIS — N481 Balanitis: Secondary | ICD-10-CM | POA: Diagnosis not present

## 2018-10-15 DIAGNOSIS — I2510 Atherosclerotic heart disease of native coronary artery without angina pectoris: Secondary | ICD-10-CM | POA: Diagnosis not present

## 2018-10-15 DIAGNOSIS — I10 Essential (primary) hypertension: Secondary | ICD-10-CM | POA: Diagnosis not present

## 2018-10-15 DIAGNOSIS — D509 Iron deficiency anemia, unspecified: Secondary | ICD-10-CM | POA: Diagnosis not present

## 2018-10-15 DIAGNOSIS — R300 Dysuria: Secondary | ICD-10-CM | POA: Diagnosis not present

## 2018-10-15 DIAGNOSIS — Z23 Encounter for immunization: Secondary | ICD-10-CM | POA: Diagnosis not present

## 2018-11-06 ENCOUNTER — Inpatient Hospital Stay (HOSPITAL_COMMUNITY): Payer: PPO | Attending: Hematology

## 2018-11-06 DIAGNOSIS — K5730 Diverticulosis of large intestine without perforation or abscess without bleeding: Secondary | ICD-10-CM | POA: Insufficient documentation

## 2018-11-06 DIAGNOSIS — E041 Nontoxic single thyroid nodule: Secondary | ICD-10-CM | POA: Insufficient documentation

## 2018-11-06 DIAGNOSIS — J439 Emphysema, unspecified: Secondary | ICD-10-CM | POA: Insufficient documentation

## 2018-11-06 DIAGNOSIS — D500 Iron deficiency anemia secondary to blood loss (chronic): Secondary | ICD-10-CM | POA: Diagnosis not present

## 2018-11-06 DIAGNOSIS — I10 Essential (primary) hypertension: Secondary | ICD-10-CM | POA: Diagnosis not present

## 2018-11-06 DIAGNOSIS — D472 Monoclonal gammopathy: Secondary | ICD-10-CM | POA: Diagnosis not present

## 2018-11-06 DIAGNOSIS — I714 Abdominal aortic aneurysm, without rupture: Secondary | ICD-10-CM | POA: Insufficient documentation

## 2018-11-06 DIAGNOSIS — K2970 Gastritis, unspecified, without bleeding: Secondary | ICD-10-CM | POA: Diagnosis not present

## 2018-11-06 DIAGNOSIS — I700 Atherosclerosis of aorta: Secondary | ICD-10-CM | POA: Insufficient documentation

## 2018-11-06 LAB — CBC WITH DIFFERENTIAL/PLATELET
ABS IMMATURE GRANULOCYTES: 0.02 10*3/uL (ref 0.00–0.07)
Basophils Absolute: 0 10*3/uL (ref 0.0–0.1)
Basophils Relative: 1 %
Eosinophils Absolute: 0.2 10*3/uL (ref 0.0–0.5)
Eosinophils Relative: 3 %
HEMATOCRIT: 36.3 % — AB (ref 39.0–52.0)
HEMOGLOBIN: 10.2 g/dL — AB (ref 13.0–17.0)
Immature Granulocytes: 0 %
LYMPHS ABS: 0.7 10*3/uL (ref 0.7–4.0)
LYMPHS PCT: 11 %
MCH: 24.6 pg — AB (ref 26.0–34.0)
MCHC: 28.1 g/dL — ABNORMAL LOW (ref 30.0–36.0)
MCV: 87.7 fL (ref 80.0–100.0)
Monocytes Absolute: 0.7 10*3/uL (ref 0.1–1.0)
Monocytes Relative: 12 %
NEUTROS ABS: 4.4 10*3/uL (ref 1.7–7.7)
Neutrophils Relative %: 73 %
Platelets: 353 10*3/uL (ref 150–400)
RBC: 4.14 MIL/uL — AB (ref 4.22–5.81)
RDW: 17.2 % — ABNORMAL HIGH (ref 11.5–15.5)
WBC: 6 10*3/uL (ref 4.0–10.5)
nRBC: 0 % (ref 0.0–0.2)

## 2018-11-06 LAB — COMPREHENSIVE METABOLIC PANEL
ALBUMIN: 3.9 g/dL (ref 3.5–5.0)
ALK PHOS: 87 U/L (ref 38–126)
ALT: 13 U/L (ref 0–44)
AST: 22 U/L (ref 15–41)
Anion gap: 6 (ref 5–15)
BUN: 21 mg/dL (ref 8–23)
CALCIUM: 9.3 mg/dL (ref 8.9–10.3)
CO2: 25 mmol/L (ref 22–32)
CREATININE: 1.51 mg/dL — AB (ref 0.61–1.24)
Chloride: 108 mmol/L (ref 98–111)
GFR calc non Af Amer: 43 mL/min — ABNORMAL LOW (ref >60)
GFR, EST AFRICAN AMERICAN: 50 mL/min — AB (ref >60)
GLUCOSE: 95 mg/dL (ref 70–99)
Potassium: 4.4 mmol/L (ref 3.5–5.1)
SODIUM: 139 mmol/L (ref 135–145)
Total Bilirubin: 0.2 mg/dL — ABNORMAL LOW (ref 0.3–1.2)
Total Protein: 6.9 g/dL (ref 6.5–8.1)

## 2018-11-06 LAB — LACTATE DEHYDROGENASE: LDH: 135 U/L (ref 98–192)

## 2018-11-06 LAB — FERRITIN: Ferritin: 36 ng/mL (ref 24–336)

## 2018-11-06 MED ORDER — HEPARIN SOD (PORK) LOCK FLUSH 100 UNIT/ML IV SOLN
INTRAVENOUS | Status: AC
  Filled 2018-11-06: qty 5

## 2018-11-09 LAB — PROTEIN ELECTROPHORESIS, SERUM
A/G Ratio: 1.6 (ref 0.7–1.7)
ALPHA-1-GLOBULIN: 0.2 g/dL (ref 0.0–0.4)
Albumin ELP: 3.9 g/dL (ref 2.9–4.4)
Alpha-2-Globulin: 0.6 g/dL (ref 0.4–1.0)
Beta Globulin: 0.9 g/dL (ref 0.7–1.3)
GLOBULIN, TOTAL: 2.4 g/dL (ref 2.2–3.9)
Gamma Globulin: 0.7 g/dL (ref 0.4–1.8)
TOTAL PROTEIN ELP: 6.3 g/dL (ref 6.0–8.5)

## 2018-11-10 ENCOUNTER — Other Ambulatory Visit: Payer: Self-pay

## 2018-11-10 ENCOUNTER — Inpatient Hospital Stay (HOSPITAL_BASED_OUTPATIENT_CLINIC_OR_DEPARTMENT_OTHER): Payer: PPO | Admitting: Internal Medicine

## 2018-11-10 ENCOUNTER — Encounter (HOSPITAL_COMMUNITY): Payer: Self-pay | Admitting: Internal Medicine

## 2018-11-10 VITALS — BP 140/70 | HR 80 | Temp 98.2°F | Resp 16 | Wt 144.0 lb

## 2018-11-10 DIAGNOSIS — I10 Essential (primary) hypertension: Secondary | ICD-10-CM

## 2018-11-10 DIAGNOSIS — D500 Iron deficiency anemia secondary to blood loss (chronic): Secondary | ICD-10-CM | POA: Diagnosis not present

## 2018-11-10 DIAGNOSIS — E041 Nontoxic single thyroid nodule: Secondary | ICD-10-CM | POA: Diagnosis not present

## 2018-11-10 DIAGNOSIS — D472 Monoclonal gammopathy: Secondary | ICD-10-CM

## 2018-11-10 DIAGNOSIS — I700 Atherosclerosis of aorta: Secondary | ICD-10-CM

## 2018-11-10 DIAGNOSIS — I714 Abdominal aortic aneurysm, without rupture: Secondary | ICD-10-CM | POA: Diagnosis not present

## 2018-11-10 DIAGNOSIS — J439 Emphysema, unspecified: Secondary | ICD-10-CM | POA: Diagnosis not present

## 2018-11-10 DIAGNOSIS — K5730 Diverticulosis of large intestine without perforation or abscess without bleeding: Secondary | ICD-10-CM | POA: Diagnosis not present

## 2018-11-10 DIAGNOSIS — K2970 Gastritis, unspecified, without bleeding: Secondary | ICD-10-CM | POA: Diagnosis not present

## 2018-11-10 NOTE — Progress Notes (Signed)
Diagnosis Iron deficiency anemia due to chronic blood loss - Plan: CBC with Differential/Platelet, Comprehensive metabolic panel, Lactate dehydrogenase, Ferritin  Staging Cancer Staging No matching staging information was found for the patient.  Assessment and Plan:  1.  Iron deficiency anemia.  He was last treated with IV iron on 09/04/2018.    CT neck, CAP done 08/01/2018 showed  IMPRESSION: 11 mm left thyroid nodule is noted. Thyroid ultrasound is recommended for further evaluation.  No other significant abnormality seen in the soft tissues of the neck.  No acute abnormality is noted in the chest.  3.4 cm infrarenal abdominal aortic aneurysm is noted. Recommend followup by ultrasound in 3 years. This recommendation follows ACR consensus guidelines: White Paper of the ACR Incidental Findings Committee II on Vascular Findings. J Am Coll Radiol 2013; 10:789-794.  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  Aortic Atherosclerosis (ICD10-I70.0) and Emphysema (ICD10-J43.9).  Pt has been seen by GI and had colonoscopy done 06/24/2018 that showed internal hemorrhoids, diverticulosis and 1 polyp was removed with pathology returning as  Hyperplastic polyp and gastritis.  There was no evidence of malignancy.  He is scheduled for capsule study in 09/2018.    Prior Anemia work-up showed normal hemoglobin electrophoresis.  Pt had a normal B12, folate, haptoglobin, LDH.  Labs done 09/08/2018 reviewed and showed wBC 6.7 HB 9.3 plts 327,000.  Chemistries WNL with K+ 3.8 Cr 0.85 and normal LFTs.  Ferritin 1032.  SPEP negative.    CT CAP for further evaluation was unrevealing.  Due to persistent IDA despite iron therapy and transfusion,  bone marrow biopsy was done 08/31/2018 and showed [place] marrow with changes thought to reflect IDA.  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   He was advised to follow-up with GI  due to bone marrow findings.   Pt had EGD done 09/17/2018 that showed  - Normal esophagus. - Red blood in the entire stomach. 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  Chemistries WNL with K+ 4.4 Cr 1.51 and normal LFTs.  SPEP negative.  Ferritin has trended downward and is 36.  Due to history of severe anemia, he is recommended for repeat IV iron with Feraheme 510 mg IV D1 and D8.  He will have repeat labs 6-8 weeks after IV iron.  Recent EGD has shown bleeding source and pt should follow-up with GI as recommended.    2. Monoclonal gammopathy.  SPEP showed minimal m spike of 0.2 g/d.  Ifix is normal.  He has a normal FLC ratio of 1.44.  SPEP was repeated on 07/10/2018 and was negative. Pt had repeat SPEP done 11/5/209 that was negative.  Bone marrow biopsy done 08/31/2018 showed findings consistent with Iron deficiency.  SPEP done 11/06/2018 was negative.  Pt has had 2 negative SPEPs and will not repeat testing unless numbers change.    3.  Thyroid nodule.  I discussed with pt nodule measures 11 mm and he was recommended for thyroid USN.  He does not desire to have that performed.  He should follow-up with PCP or Dr. Luan Pulling for ongoing monitoring.    4  AAA.  This was noted on recent CT and measured 3.4 cm.  Pt should follow-up with PCP for ongoing monitoring.  He was given  option of CT surgery referral but does not desire to be referred.    5.  Hypertension.  Blood pressure is 140/70.  Follow-up with PCP.  6.  Smoking.  Patient reports he has discontinued smoking in February 2019.  CT chest done 07/2018 showed no lung abnormalities.  Continue to follow-up with Dr. Luan Pulling of pulmonary for follow-up.    7.  Health maintenance.  Colonoscopy done 06/24/2018 showed  - Internal hemorrhoids. - Diverticulosis in the entire examined colon. - One 9 mm polyp in  the sigmoid colon, removed with a hot snare. Resected and retrieved.  Pathology showed gastritis and hyperplastic polyp but no evidence of malignancy.  Pt should follow-up with GI as recommended.    25 minutes spent with more than 50% spent in counseling and coordination of care.    Interval History:  Historical data obtained from the note dated 04/02/2018.  83 year old male referred by Dr. Luan Pulling for evaluation of anemia.  The patient was seen in the emergency room due to severe anemia.  He reported he had been feeling tired, also dizzy.  He had been seen at [place] and was transferred to [facility] for evaluation.  Labs done showed a white count of 8.6 hemoglobin 6.7 platelets 307,000.  PT INR was within normal limits.  Potassium was 3.8 bicarb was 18 creatinine was 1.18.  Labs done 01/21/2018 showed a white count 11.4 hemoglobin 8.9 platelets 284,000.  MCV was 75.  H&H done 03/03/2018 showed a hemoglobin of 6.  He was last transfused 03/03/2018.  He reports craving ice.  He denies any blood in his stool or his urine.  He had been offered GI evaluation but refused GI evaluation.  He reports he is no longer taking iron supplements.  He denies any family history of leukemia, lymphoma, hemoglobinopathy.  He was a longtime smoker reportedly last smoked in February 2019.  He reports he was hospitalized in [place] for shortness of breath and was intubated.  He was found to have IDA and was treated with IV iron on 03/20/2018 and 03/27/2018.    Labs done 03/16/2018 showed a white count 8.4 hemoglobin 7.7 MCV 72 platelets 388,000. Ferritin was 5, Creatinine is 1.02.   Current Status:  Pt is seen today for follow-up.  He is here to go over labs.  Wife reports recent GI evaluation showed areas of bleeding.   Problem List Patient Active Problem List   Diagnosis Date Noted  . Iron deficiency anemia due to chronic blood loss [D50.0] 03/17/2018  . Open wound of penis [S31.20XA] 01/21/2018  .  Symptomatic anemia [D64.9] 01/20/2018  . Hypertension [I10] 01/20/2018  . Hypercholesteremia [E78.00] 01/20/2018  . GERD (gastroesophageal reflux disease) [K21.9] 01/20/2018  . Vitamin B deficiency [E53.9] 01/20/2018    Past Medical History Past Medical History:  Diagnosis Date  . Anemia   . GERD (gastroesophageal reflux disease)   . Hypercholesteremia   . Hypertension   . Iron deficiency anemia due to chronic blood loss 03/17/2018  . Vitamin B deficiency    patient denies    Past Surgical History Past Surgical History:  Procedure Laterality Date  . BIOPSY  06/24/2018   Procedure: BIOPSY;  Surgeon: Daneil Dolin, MD;  [facility];  Service: Endoscopy;;  gastric   . COLONOSCOPY N/A 06/24/2018   Dr. Gala Romney: Internal hemorrhoids, diverticulosis, 9 mm polyp removed from the sigmoid colon which was hyperplastic.  Marland Kitchen ESOPHAGOGASTRODUODENOSCOPY N/A 06/24/2018   Dr. Gala Romney: Multiple erosions  in the stomach and duodenal bulb, gastric biopsies with chronic gastritis with intestinal metaplasia, no H. pylori.  . ESOPHAGOGASTRODUODENOSCOPY (EGD) WITH PROPOFOL N/A 09/17/2018   Procedure: ESOPHAGOGASTRODUODENOSCOPY (EGD) WITH PROPOFOL;  Surgeon: Daneil Dolin, MD;  [facility];  Service: Endoscopy;  Laterality: N/A;  11:00am  . GIVENS CAPSULE STUDY N/A 09/15/2018   Procedure: GIVENS CAPSULE STUDY;  Surgeon: Daneil Dolin, MD;  [facility];  Service: Endoscopy;  Laterality: N/A;  7:30am  . none    . POLYPECTOMY  06/24/2018   Procedure: POLYPECTOMY;  Surgeon: Daneil Dolin, MD;  [facility];  Service: Endoscopy;;  sigmoid polyp hs    Family History Family History  Problem Relation Age of Onset  . Colon cancer Neg Hx      Social History  reports that he quit smoking about 10 months ago. His smoking use included cigarettes. He has a 33.50 pack-year smoking history. He has never used smokeless tobacco. He reports that he does not drink alcohol  or use drugs.  Medications  Current Outpatient Medications:  .  alfuzosin (UROXATRAL) 10 MG 24 hr tablet, Take 10 mg by mouth at bedtime., Disp: , Rfl: 11 .  amLODipine (NORVASC) 5 MG tablet, Take 5 mg by mouth daily., Disp: , Rfl:  .  aspirin EC 81 MG tablet, Take 81 mg by mouth daily., Disp: , Rfl:  .  atorvastatin (LIPITOR) 10 MG tablet, Take 10 mg by mouth daily with supper. , Disp: , Rfl:  .  carvedilol (COREG) 12.5 MG tablet, Take 12.5 mg by mouth 2 (two) times daily with a meal., Disp: , Rfl:  .  clotrimazole-betamethasone (LOTRISONE) cream, Apply 1 application topically 2 (two) times daily., Disp: , Rfl: 3 .  Ferrous Sulfate (IRON) 325 (65 Fe) MG TABS, Take 1 tablet by mouth daily., Disp: , Rfl: 5 .  Multiple Vitamins-Minerals (CENTRUM SILVER PO), Take 1 tablet by mouth daily., Disp: , Rfl:  .  pantoprazole (PROTONIX) 40 MG tablet, Take 40 mg by mouth daily. , Disp: , Rfl:  .  vitamin C (ASCORBIC ACID) 500 MG tablet, Take 500 mg by mouth daily., Disp: , Rfl:  .  vitamin E 100 UNIT capsule, Take 100 Units by mouth daily. , Disp: , Rfl:   Allergies Patient has no known allergies.  Review of Systems Review of Systems - Oncology ROS negative   Physical Exam  Vitals Wt Readings from Last 3 Encounters:  11/10/18 144 lb (65.3 kg)  09/17/18 141 lb (64 kg)  09/15/18 141 lb (64 kg)   Temp Readings from Last 3 Encounters:  11/10/18 98.2 F (36.8 C) (Oral)  09/17/18 97.6 F (36.4 C) (Oral)  09/10/18 97.6 F (36.4 C) (Oral)   BP Readings from Last 3 Encounters:  11/10/18 140/70  09/17/18 140/72  09/10/18 (!) 162/65   Pulse Readings from Last 3 Encounters:  11/10/18 80  09/17/18 65  09/10/18 86   Constitutional: Well-developed, well-nourished, and in no distress.   HENT: Head: Normocephalic and atraumatic.  Mouth/Throat: No oropharyngeal exudate. Mucosa moist. Eyes: Pupils are equal, round, and reactive to light. Conjunctivae are normal. No scleral icterus.  Neck:  Normal range of motion. Neck supple. No JVD present.  Cardiovascular: Normal rate, regular rhythm and normal heart sounds.  Exam reveals no gallop and no friction rub.   No murmur heard. Pulmonary/Chest: Effort normal and breath sounds normal. No respiratory distress. No wheezes.No rales.  Abdominal: Soft. Bowel sounds are normal.  No distension. There is no tenderness. There is no guarding.  Musculoskeletal: No edema or tenderness.  Lymphadenopathy: No cervical, axillary or supraclavicular adenopathy.  Neurological: Alert and oriented to person, place, and time. No cranial nerve deficit.  Skin: Skin is warm and dry. No rash noted. No erythema. No pallor.  Psychiatric: Affect and judgment normal.   Labs No visits with results within 3 Day(s) from this visit.  Latest known visit with results is:  Appointment on 11/06/2018  Component Date Value Ref Range Status  . WBC 11/06/2018 6.0  4.0 - 10.5 K/uL Final  . RBC 11/06/2018 4.14* 4.22 - 5.81 MIL/uL Final  . Hemoglobin 11/06/2018 10.2* 13.0 - 17.0 g/dL Final  . HCT 11/06/2018 36.3* 39.0 - 52.0 % Final  . MCV 11/06/2018 87.7  80.0 - 100.0 fL Final  . MCH 11/06/2018 24.6* 26.0 - 34.0 pg Final  . MCHC 11/06/2018 28.1* 30.0 - 36.0 g/dL Final  . RDW 11/06/2018 17.2* 11.5 - 15.5 % Final  . Platelets 11/06/2018 353  150 - 400 K/uL Final  . nRBC 11/06/2018 0.0  0.0 - 0.2 % Final  . Neutrophils Relative % 11/06/2018 73  % Final  . Neutro Abs 11/06/2018 4.4  1.7 - 7.7 K/uL Final  . Lymphocytes Relative 11/06/2018 11  % Final  . Lymphs Abs 11/06/2018 0.7  0.7 - 4.0 K/uL Final  . Monocytes Relative 11/06/2018 12  % Final  . Monocytes Absolute 11/06/2018 0.7  0.1 - 1.0 K/uL Final  . Eosinophils Relative 11/06/2018 3  % Final  . Eosinophils Absolute 11/06/2018 0.2  0.0 - 0.5 K/uL Final  . Basophils Relative 11/06/2018 1  % Final  . Basophils Absolute 11/06/2018 0.0  0.0 - 0.1 K/uL Final  . Immature Granulocytes 11/06/2018 0  % Final  . Abs Immature  Granulocytes 11/06/2018 0.02  0.00 - 0.07 K/uL Final   Performed at [facility], [address] [place], [place] [address]  . Sodium 11/06/2018 139  135 - 145 mmol/L Final  . Potassium 11/06/2018 4.4  3.5 - 5.1 mmol/L Final  . Chloride 11/06/2018 108  98 - 111 mmol/L Final  . CO2 11/06/2018 25  22 - 32 mmol/L Final  . Glucose, Bld 11/06/2018 95  70 - 99 mg/dL Final  . BUN 11/06/2018 21  8 - 23 mg/dL Final  . Creatinine, Ser 11/06/2018 1.51* 0.61 - 1.24 mg/dL Final  . Calcium 11/06/2018 9.3  8.9 - 10.3 mg/dL Final  . Total Protein 11/06/2018 6.9  6.5 - 8.1 g/dL Final  . Albumin 11/06/2018 3.9  3.5 - 5.0 g/dL Final  . AST 11/06/2018 22  15 - 41 U/L Final  . ALT 11/06/2018 13  0 - 44 U/L Final  . Alkaline Phosphatase 11/06/2018 87  38 - 126 U/L Final  . Total Bilirubin 11/06/2018 0.2* 0.3 - 1.2 mg/dL Final  . GFR calc non Af Amer 11/06/2018 43* >60 mL/min Final  . GFR calc Af Amer 11/06/2018 50* >60 mL/min Final  . Anion gap 11/06/2018 6  5 - 15 Final   Performed at [facility], [address] [place], [place] [address]  . LDH 11/06/2018 135  98 - 192 U/L Final   Performed at [facility], [address] [place], [place] [address]  . Ferritin 11/06/2018 36  24 - 336 ng/mL Final   Performed at [facility], [address], [place], [place] [address]  . Total Protein ELP 11/06/2018 6.3  6.0 - 8.5 g/dL Final  .  Albumin ELP 11/06/2018 3.9  2.9 - 4.4 g/dL Final  . Alpha-1-Globulin 11/06/2018 0.2  0.0 - 0.4 g/dL Final  . Alpha-2-Globulin 11/06/2018 0.6  0.4 - 1.0 g/dL Final  . Beta Globulin 11/06/2018 0.9  0.7 - 1.3 g/dL Final  . Gamma Globulin 11/06/2018 0.7  0.4 - 1.8 g/dL Final  . M-Spike, % 11/06/2018 Not Observed  Not Observed g/dL Final  . SPE Interp. 11/06/2018 Comment   Final   Comment: (NOTE) The SPE pattern appears essentially unremarkable. Evidence of monoclonal protein is not apparent. Performed At: [facility] [place], [place] [id number] Rush Farmer MD [id number]   . Comment 11/06/2018 Comment   Final   Comment: (NOTE) Protein electrophoresis scan will follow via computer, mail, or courier delivery.   Marland Kitchen GLOBULIN, TOTAL 11/06/2018 2.4  2.2 - 3.9 g/dL Corrected  . A/G Ratio 11/06/2018 1.6  0.7 - 1.7 Corrected     Pathology Orders Placed This Encounter  Procedures  . CBC with Differential/Platelet    Standing Status:   Future    Standing Expiration Date:   11/11/2019  . Comprehensive metabolic panel    Standing Status:   Future    Standing Expiration Date:   11/11/2019  . Lactate dehydrogenase    Standing Status:   Future    Standing Expiration Date:   11/11/2019  . Ferritin    Standing Status:   Future    Standing Expiration Date:   11/11/2019       Zoila Shutter MD

## 2018-11-13 ENCOUNTER — Inpatient Hospital Stay (HOSPITAL_COMMUNITY): Payer: PPO | Attending: Hematology

## 2018-11-13 ENCOUNTER — Encounter (HOSPITAL_COMMUNITY): Payer: Self-pay

## 2018-11-13 VITALS — BP 159/66 | HR 71 | Temp 98.1°F | Resp 16

## 2018-11-13 DIAGNOSIS — D500 Iron deficiency anemia secondary to blood loss (chronic): Secondary | ICD-10-CM

## 2018-11-13 DIAGNOSIS — D509 Iron deficiency anemia, unspecified: Secondary | ICD-10-CM | POA: Diagnosis not present

## 2018-11-13 MED ORDER — SODIUM CHLORIDE 0.9% FLUSH
10.0000 mL | Freq: Once | INTRAVENOUS | Status: AC | PRN
  Administered 2018-11-13: 10 mL

## 2018-11-13 MED ORDER — SODIUM CHLORIDE 0.9 % IV SOLN
Freq: Once | INTRAVENOUS | Status: AC
  Administered 2018-11-13: 14:00:00 via INTRAVENOUS

## 2018-11-13 MED ORDER — SODIUM CHLORIDE 0.9 % IV SOLN
510.0000 mg | Freq: Once | INTRAVENOUS | Status: AC
  Administered 2018-11-13: 510 mg via INTRAVENOUS
  Filled 2018-11-13: qty 17

## 2018-11-13 NOTE — Progress Notes (Signed)
Patient tolerated iron infusion with no complaints voiced.  Good blood return noted before and after infusion.  Band aid applied.  VSs with discharge and left ambulatory with wife with no s/s of distress noted.

## 2018-11-13 NOTE — Patient Instructions (Signed)
[facility]  Discharge Instructions:   _______________________________________________________________  Thank you for choosing [facility] to provide your oncology and hematology care.  To afford each patient quality time with our providers, please arrive at least 15 minutes before your scheduled appointment.  You need to re-schedule your appointment if you arrive 10 or more minutes late.  We strive to give you quality time with our providers, and arriving late affects you and other patients whose appointments are after yours.  Also, if you no show three or more times for appointments you may be dismissed from the clinic.  Again, thank you for choosing  [facility] at [facility]. Our hope is that these requests will allow you access to exceptional care and in a timely manner. _______________________________________________________________  If you have questions after your visit, please contact our office at [phone] between the hours of 8:30 a.m. and 5:00 p.m. Voicemails left after 4:30 p.m. will not be returned until the following business day. _______________________________________________________________  For prescription refill requests, have your pharmacy contact our office. _______________________________________________________________  Recommendations made by the consultant and any test results will be sent to your referring physician. _______________________________________________________________ 

## 2018-11-20 ENCOUNTER — Inpatient Hospital Stay (HOSPITAL_COMMUNITY): Payer: PPO

## 2018-11-20 ENCOUNTER — Other Ambulatory Visit: Payer: Self-pay

## 2018-11-20 ENCOUNTER — Encounter (HOSPITAL_COMMUNITY): Payer: Self-pay

## 2018-11-20 VITALS — BP 164/67 | HR 70 | Temp 97.8°F | Resp 18

## 2018-11-20 DIAGNOSIS — K2970 Gastritis, unspecified, without bleeding: Secondary | ICD-10-CM | POA: Diagnosis not present

## 2018-11-20 DIAGNOSIS — D500 Iron deficiency anemia secondary to blood loss (chronic): Secondary | ICD-10-CM

## 2018-11-20 MED ORDER — SODIUM CHLORIDE 0.9% FLUSH: 10.0000 mL | Freq: Once | INTRAVENOUS | Status: DC | PRN

## 2018-11-20 MED ORDER — SODIUM CHLORIDE 0.9 % IV SOLN
510.0000 mg | Freq: Once | INTRAVENOUS | Status: AC
  Administered 2018-11-20: 510 mg via INTRAVENOUS
  Filled 2018-11-20: qty 17

## 2018-11-20 MED ORDER — SODIUM CHLORIDE 0.9 % IV SOLN
Freq: Once | INTRAVENOUS | Status: AC
  Administered 2018-11-20: 15:00:00 via INTRAVENOUS

## 2018-11-20 NOTE — Patient Instructions (Signed)
[facility]  Discharge Instructions:   _______________________________________________________________  Thank you for choosing [facility] to provide your oncology and hematology care.  To afford each patient quality time with our providers, please arrive at least 15 minutes before your scheduled appointment.  You need to re-schedule your appointment if you arrive 10 or more minutes late.  We strive to give you quality time with our providers, and arriving late affects you and other patients whose appointments are after yours.  Also, if you no show three or more times for appointments you may be dismissed from the clinic.  Again, thank you for choosing [facility]. Our hope is that these requests will allow you access to exceptional care and in a timely manner. _______________________________________________________________  If you have questions after your visit, please contact our office at [phone] between the hours of 8:30 a.m. and 5:00 p.m. Voicemails left after 4:30 p.m. will not be returned until the following business day. _______________________________________________________________  For prescription refill requests, have your pharmacy contact our office. _______________________________________________________________  Recommendations made by the consultant and any test results will be sent to your referring physician. _______________________________________________________________ 

## 2018-11-20 NOTE — Progress Notes (Signed)
Pt presents for Feraheme today. VSS. Pt has no complaints of pain or any changes since last visit. MAR reviewed and updated.   Feraheme given today per MD orders. Tolerated infusion without adverse affects. Vital signs stable. No complaints at this time. Discharged from clinic ambulatory. F/U with [facility] as scheduled  Feraheme given today per MD orders. Tolerated infusion without adverse affects. Vital signs stable. No complaints at this time. Discharged from clinic ambulatory. F/U with [facility] as scheduled.

## 2018-12-21 ENCOUNTER — Inpatient Hospital Stay (HOSPITAL_COMMUNITY): Payer: PPO | Attending: Hematology

## 2018-12-21 DIAGNOSIS — E041 Nontoxic single thyroid nodule: Secondary | ICD-10-CM | POA: Diagnosis not present

## 2018-12-21 DIAGNOSIS — I714 Abdominal aortic aneurysm, without rupture: Secondary | ICD-10-CM | POA: Diagnosis not present

## 2018-12-21 DIAGNOSIS — D500 Iron deficiency anemia secondary to blood loss (chronic): Secondary | ICD-10-CM

## 2018-12-21 DIAGNOSIS — D472 Monoclonal gammopathy: Secondary | ICD-10-CM | POA: Diagnosis not present

## 2018-12-21 DIAGNOSIS — D509 Iron deficiency anemia, unspecified: Secondary | ICD-10-CM | POA: Diagnosis not present

## 2018-12-21 DIAGNOSIS — I10 Essential (primary) hypertension: Secondary | ICD-10-CM | POA: Insufficient documentation

## 2018-12-21 DIAGNOSIS — F17210 Nicotine dependence, cigarettes, uncomplicated: Secondary | ICD-10-CM | POA: Insufficient documentation

## 2018-12-21 LAB — COMPREHENSIVE METABOLIC PANEL
ALT: 13 U/L (ref 0–44)
AST: 21 U/L (ref 15–41)
Albumin: 3.6 g/dL (ref 3.5–5.0)
Alkaline Phosphatase: 72 U/L (ref 38–126)
Anion gap: 7 (ref 5–15)
BUN: 19 mg/dL (ref 8–23)
CO2: 25 mmol/L (ref 22–32)
CREATININE: 1.2 mg/dL (ref 0.61–1.24)
Calcium: 9.3 mg/dL (ref 8.9–10.3)
Chloride: 109 mmol/L (ref 98–111)
GFR calc Af Amer: >60 mL/min (ref >60)
GFR calc non Af Amer: 57 mL/min — ABNORMAL LOW (ref >60)
Glucose, Bld: 98 mg/dL (ref 70–99)
Potassium: 4 mmol/L (ref 3.5–5.1)
Sodium: 141 mmol/L (ref 135–145)
Total Bilirubin: 0.2 mg/dL — ABNORMAL LOW (ref 0.3–1.2)
Total Protein: 6.6 g/dL (ref 6.5–8.1)

## 2018-12-21 LAB — CBC WITH DIFFERENTIAL/PLATELET
Abs Immature Granulocytes: 0.02 10*3/uL (ref 0.00–0.07)
BASOS ABS: 0 10*3/uL (ref 0.0–0.1)
Basophils Relative: 1 %
Eosinophils Absolute: 0.2 10*3/uL (ref 0.0–0.5)
Eosinophils Relative: 3 %
HCT: 34.7 % — ABNORMAL LOW (ref 39.0–52.0)
Hemoglobin: 9.9 g/dL — ABNORMAL LOW (ref 13.0–17.0)
IMMATURE GRANULOCYTES: 0 %
Lymphocytes Relative: 13 %
Lymphs Abs: 0.8 10*3/uL (ref 0.7–4.0)
MCH: 26 pg (ref 26.0–34.0)
MCHC: 28.5 g/dL — ABNORMAL LOW (ref 30.0–36.0)
MCV: 91.1 fL (ref 80.0–100.0)
Monocytes Absolute: 0.8 10*3/uL (ref 0.1–1.0)
Monocytes Relative: 13 %
NRBC: 0 % (ref 0.0–0.2)
Neutro Abs: 4.1 10*3/uL (ref 1.7–7.7)
Neutrophils Relative %: 70 %
Platelets: 314 10*3/uL (ref 150–400)
RBC: 3.81 MIL/uL — ABNORMAL LOW (ref 4.22–5.81)
RDW: 16.8 % — ABNORMAL HIGH (ref 11.5–15.5)
WBC: 5.9 10*3/uL (ref 4.0–10.5)

## 2018-12-21 LAB — LACTATE DEHYDROGENASE: LDH: 126 U/L (ref 98–192)

## 2018-12-21 LAB — FERRITIN: Ferritin: 83 ng/mL (ref 24–336)

## 2018-12-22 MED ORDER — OCTREOTIDE ACETATE 30 MG IM KIT
PACK | INTRAMUSCULAR | Status: AC
  Filled 2018-12-22: qty 1

## 2018-12-24 ENCOUNTER — Encounter (HOSPITAL_COMMUNITY): Payer: Self-pay | Admitting: Internal Medicine

## 2018-12-24 ENCOUNTER — Other Ambulatory Visit: Payer: Self-pay

## 2018-12-24 ENCOUNTER — Inpatient Hospital Stay (HOSPITAL_BASED_OUTPATIENT_CLINIC_OR_DEPARTMENT_OTHER): Payer: PPO | Admitting: Internal Medicine

## 2018-12-24 VITALS — BP 152/67 | HR 79 | Temp 97.7°F | Resp 18 | Wt 148.1 lb

## 2018-12-24 DIAGNOSIS — I714 Abdominal aortic aneurysm, without rupture: Secondary | ICD-10-CM | POA: Diagnosis not present

## 2018-12-24 DIAGNOSIS — D472 Monoclonal gammopathy: Secondary | ICD-10-CM | POA: Diagnosis not present

## 2018-12-24 DIAGNOSIS — E041 Nontoxic single thyroid nodule: Secondary | ICD-10-CM

## 2018-12-24 DIAGNOSIS — I10 Essential (primary) hypertension: Secondary | ICD-10-CM | POA: Diagnosis not present

## 2018-12-24 DIAGNOSIS — D509 Iron deficiency anemia, unspecified: Secondary | ICD-10-CM | POA: Diagnosis not present

## 2018-12-24 DIAGNOSIS — D500 Iron deficiency anemia secondary to blood loss (chronic): Secondary | ICD-10-CM

## 2018-12-24 DIAGNOSIS — F17210 Nicotine dependence, cigarettes, uncomplicated: Secondary | ICD-10-CM

## 2018-12-24 NOTE — Progress Notes (Signed)
Diagnosis Iron deficiency anemia due to chronic blood loss - Plan: CBC with Differential, Comprehensive metabolic panel, Lactate dehydrogenase, Ferritin  Staging Cancer Staging No matching staging information was found for the patient.  Assessment and Plan:  1.  Iron deficiency anemia.  He was last treated with IV iron on 09/04/2018.    CT neck, CAP done 08/01/2018 showed  IMPRESSION: 11 mm left thyroid nodule is noted. Thyroid ultrasound is recommended for further evaluation.  No other significant abnormality seen in the soft tissues of the neck.  No acute abnormality is noted in the chest.  3.4 cm infrarenal abdominal aortic aneurysm is noted. Recommend followup by ultrasound in 3 years. This recommendation follows ACR consensus guidelines: White Paper of the ACR Incidental Findings Committee II on Vascular Findings. J Am Coll Radiol 2013; 10:789-794.  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  Aortic Atherosclerosis (ICD10-I70.0) and Emphysema (ICD10-J43.9).  Pt has been seen by GI and had colonoscopy done 06/24/2018 that showed internal hemorrhoids, diverticulosis and 1 polyp was removed with pathology returning as  Hyperplastic polyp and gastritis.  There was no evidence of malignancy.  He is scheduled for capsule study in 09/2018.    Prior Anemia work-up showed normal hemoglobin electrophoresis.  Pt had a normal B12, folate, haptoglobin, LDH.  Labs done 09/08/2018 reviewed and showed wBC 6.7 HB 9.3 plts 327,000.  Chemistries WNL with K+ 3.8 Cr 0.85 and normal LFTs.  Ferritin 1032.  SPEP negative.    CT CAP for further evaluation was unrevealing.  Due to persistent IDA despite iron therapy and transfusion,  bone marrow biopsy was done 08/31/2018 and showed [place] marrow with changes thought to reflect IDA.  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   He was advised to follow-up with GI due to  bone marrow findings.   Pt had EGD done 09/17/2018 that showed  - Normal esophagus. - Red blood in the entire stomach. 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  Chemistries WNL with K+ 4 Cr 1.2 and normal LFTs.  Ferritin 83.  HB stable at 10.  Will repeat labs in 03/2019.    EGD done 09/17/2018 showed bleeding source and pt should follow-up with GI as recommended.    2. Monoclonal gammopathy.  SPEP showed minimal m spike of 0.2 g/d.  Ifix is normal.  He has a normal FLC ratio of 1.44.  SPEP was repeated on 07/10/2018 and was negative. Pt had repeat SPEP done 11/5/209 that was negative.  Bone marrow biopsy done 08/31/2018 showed findings consistent with Iron deficiency.  SPEP done 11/06/2018 was negative.  Pt has had 2 negative SPEPs and will not repeat testing unless Cr or calcium levels change.    3.  Thyroid nodule.  Nodule measures 11 mm and he was recommended for thyroid USN.  He does not desire to have that performed.  He should follow-up with PCP or Dr. Luan Pulling for ongoing monitoring.    4  AAA.  This was noted on recent CT and measured 3.4 cm.  Pt should follow-up with PCP for ongoing monitoring.  He was given option of CT surgery referral but does not desire to be referred.    5.  Hypertension.  Blood pressure is 152/67.  Follow-up with PCP.  6.  Smoking.  Patient reports he has discontinued smoking in February 2019.  CT chest done 07/2018 showed no lung abnormalities.  Continue to follow-up with Dr. Luan Pulling of pulmonary for follow-up.    7.  Health maintenance.  Colonoscopy done 06/24/2018 showed  - Internal hemorrhoids. - Diverticulosis in the entire examined colon. - One 9 mm polyp in the sigmoid colon, removed with a hot snare. Resected and retrieved.  Pathology showed gastritis and hyperplastic polyp but no evidence of malignancy.  Pt  should follow-up with GI as recommended.    Interval History:  Historical data obtained from the note dated 04/02/2018.  83 year old male referred by Dr. Luan Pulling for evaluation of anemia.  The patient was seen in the emergency room due to severe anemia.  He reported he had been feeling tired, also dizzy.  He had been seen at [facility] and was transferred to [facility] for evaluation.  Labs done showed a white count of 8.6 hemoglobin 6.7 platelets 307,000.  PT INR was within normal limits.  Potassium was 3.8 bicarb was 18 creatinine was 1.18.  Labs done 01/21/2018 showed a white count 11.4 hemoglobin 8.9 platelets 284,000.  MCV was 75.  H&H done 03/03/2018 showed a hemoglobin of 6.  He was last transfused 03/03/2018.  He reports craving ice.  He denies any blood in his stool or his urine.  He had been offered GI evaluation but refused GI evaluation.  He reports he is no longer taking iron supplements.  He denies any family history of leukemia, lymphoma, hemoglobinopathy.  He was a longtime smoker reportedly last smoked in February 2019.  He reports he was hospitalized in [place] for shortness of breath and was intubated.  He was found to have IDA and was treated with IV iron on 03/20/2018 and 03/27/2018.    Labs done 03/16/2018 showed a white count 8.4 hemoglobin 7.7 MCV 72 platelets 388,000. Ferritin was 5, Creatinine is 1.02.   Current Status:  Pt is seen today for follow-up.  He is here to go over labs.  Pt denies any blood in stool or urine.    Problem List Patient Active Problem List   Diagnosis Date Noted  . Iron deficiency anemia due to chronic blood loss [D50.0] 03/17/2018  . Open wound of penis [S31.20XA] 01/21/2018  . Symptomatic anemia [D64.9] 01/20/2018  . Hypertension [I10] 01/20/2018  . Hypercholesteremia [E78.00] 01/20/2018  . GERD (gastroesophageal reflux disease) [K21.9] 01/20/2018  . Vitamin B deficiency [E53.9] 01/20/2018    Past Medical History Past Medical History:   Diagnosis Date  . Anemia   . GERD (gastroesophageal reflux disease)   . Hypercholesteremia   . Hypertension   . Iron deficiency anemia due to chronic blood loss 03/17/2018  . Vitamin B deficiency    patient denies    Past Surgical History Past Surgical History:  Procedure Laterality Date  . BIOPSY  06/24/2018   Procedure: BIOPSY;  Surgeon: Daneil Dolin, MD;  [facility];  Service: Endoscopy;;  gastric   . COLONOSCOPY N/A 06/24/2018   Dr. Gala Romney: Internal hemorrhoids, diverticulosis, 9 mm polyp removed from the sigmoid colon which was hyperplastic.  Marland Kitchen ESOPHAGOGASTRODUODENOSCOPY N/A 06/24/2018   Dr. Gala Romney: Multiple erosions in the stomach and duodenal bulb, gastric biopsies with chronic gastritis with intestinal metaplasia, no H. pylori.  . ESOPHAGOGASTRODUODENOSCOPY (EGD) WITH PROPOFOL N/A 09/17/2018   Procedure: ESOPHAGOGASTRODUODENOSCOPY (EGD) WITH PROPOFOL;  Surgeon: Daneil Dolin, MD;  [facility];  Service: Endoscopy;  Laterality: N/A;  11:00am  . GIVENS CAPSULE STUDY N/A 09/15/2018   Procedure: GIVENS CAPSULE STUDY;  Surgeon: Daneil Dolin, MD;  [facility];  Service: Endoscopy;  Laterality: N/A;  7:30am  . none    . POLYPECTOMY  06/24/2018   Procedure: POLYPECTOMY;  Surgeon: Daneil Dolin, MD;  [facility];  Service: Endoscopy;;  sigmoid polyp hs    Family History Family History  Problem Relation Age of Onset  . Colon cancer Neg Hx      Social History  reports that he quit smoking about a year ago. His smoking use included cigarettes. He has a 33.50 pack-year smoking history. He has never used smokeless tobacco. He reports that he does not drink alcohol or use drugs.  Medications  Current Outpatient Medications:  .  alfuzosin (UROXATRAL) 10 MG 24 hr tablet, Take 10 mg by mouth at bedtime., Disp: , Rfl: 11 .  amLODipine (NORVASC) 5 MG tablet, Take 5 mg by mouth daily., Disp: , Rfl:  .  aspirin EC 81 MG tablet, Take  81 mg by mouth daily., Disp: , Rfl:  .  atorvastatin (LIPITOR) 10 MG tablet, Take 10 mg by mouth daily with supper. , Disp: , Rfl:  .  carvedilol (COREG) 12.5 MG tablet, Take 12.5 mg by mouth 2 (two) times daily with a meal., Disp: , Rfl:  .  clotrimazole-betamethasone (LOTRISONE) cream, Apply 1 application topically 2 (two) times daily., Disp: , Rfl: 3 .  Ferrous Sulfate (IRON) 325 (65 Fe) MG TABS, Take 1 tablet by mouth daily., Disp: , Rfl: 5 .  Multiple Vitamins-Minerals (CENTRUM SILVER PO), Take 1 tablet by mouth daily., Disp: , Rfl:  .  pantoprazole (PROTONIX) 40 MG tablet, Take 40 mg by mouth daily. , Disp: , Rfl:  .  vitamin C (ASCORBIC ACID) 500 MG tablet, Take 500 mg by mouth daily., Disp: , Rfl:  .  vitamin E 100 UNIT capsule, Take 100 Units by mouth daily. , Disp: , Rfl:   Allergies Patient has no known allergies.  Review of Systems Review of Systems - Oncology ROS negative   Physical Exam  Vitals Wt Readings from Last 3 Encounters:  12/24/18 148 lb 1.6 oz (67.2 kg)  11/10/18 144 lb (65.3 kg)  09/17/18 141 lb (64 kg)   Temp Readings from Last 3 Encounters:  12/24/18 97.7 F (36.5 C) (Oral)  11/20/18 97.8 F (36.6 C) (Oral)  11/13/18 98.1 F (36.7 C) (Oral)   BP Readings from Last 3 Encounters:  12/24/18 (!) 152/67  11/20/18 (!) 164/67  11/13/18 (!) 159/66   Pulse Readings from Last 3 Encounters:  12/24/18 79  11/20/18 70  11/13/18 71   Constitutional: Well-developed, well-nourished, and in no distress.   HENT: Head: Normocephalic and atraumatic.  Mouth/Throat: No oropharyngeal exudate. Mucosa moist. Eyes: Pupils are equal, round, and reactive to light. Conjunctivae are normal. No scleral icterus.  Neck: Normal range of motion. Neck supple. No JVD present.  Cardiovascular: Normal rate, regular rhythm and normal heart sounds.  Exam reveals no gallop and no friction rub.   No murmur heard. Pulmonary/Chest: Effort normal and breath sounds normal. No  respiratory distress. No wheezes.No rales.  Abdominal: Soft. Bowel sounds are normal. No distension. There is no tenderness. There is no guarding.  Musculoskeletal: No edema or tenderness.  Lymphadenopathy: No cervical, axillary or supraclavicular adenopathy.  Neurological: Alert and oriented to person, place, and time. No cranial nerve deficit.  Skin: Skin is warm  and dry. No rash noted. No erythema. No pallor.  Psychiatric: Affect and judgment normal.   Labs No visits with results within 3 Day(s) from this visit.  Latest known visit with results is:  Appointment on 12/21/2018  Component Date Value Ref Range Status  . WBC 12/21/2018 5.9  4.0 - 10.5 K/uL Final  . RBC 12/21/2018 3.81* 4.22 - 5.81 MIL/uL Final  . Hemoglobin 12/21/2018 9.9* 13.0 - 17.0 g/dL Final  . HCT 12/21/2018 34.7* 39.0 - 52.0 % Final  . MCV 12/21/2018 91.1  80.0 - 100.0 fL Final  . MCH 12/21/2018 26.0  26.0 - 34.0 pg Final  . MCHC 12/21/2018 28.5* 30.0 - 36.0 g/dL Final  . RDW 12/21/2018 16.8* 11.5 - 15.5 % Final  . Platelets 12/21/2018 314  150 - 400 K/uL Final  . nRBC 12/21/2018 0.0  0.0 - 0.2 % Final  . Neutrophils Relative % 12/21/2018 70  % Final  . Neutro Abs 12/21/2018 4.1  1.7 - 7.7 K/uL Final  . Lymphocytes Relative 12/21/2018 13  % Final  . Lymphs Abs 12/21/2018 0.8  0.7 - 4.0 K/uL Final  . Monocytes Relative 12/21/2018 13  % Final  . Monocytes Absolute 12/21/2018 0.8  0.1 - 1.0 K/uL Final  . Eosinophils Relative 12/21/2018 3  % Final  . Eosinophils Absolute 12/21/2018 0.2  0.0 - 0.5 K/uL Final  . Basophils Relative 12/21/2018 1  % Final  . Basophils Absolute 12/21/2018 0.0  0.0 - 0.1 K/uL Final  . Immature Granulocytes 12/21/2018 0  % Final  . Abs Immature Granulocytes 12/21/2018 0.02  0.00 - 0.07 K/uL Final   Performed at [facility], [address], [place], [place] [address]  . Sodium 12/21/2018 141  135 - 145 mmol/L Final  . Potassium 12/21/2018 4.0  3.5 - 5.1 mmol/L Final  . Chloride  12/21/2018 109  98 - 111 mmol/L Final  . CO2 12/21/2018 25  22 - 32 mmol/L Final  . Glucose, Bld 12/21/2018 98  70 - 99 mg/dL Final  . BUN 12/21/2018 19  8 - 23 mg/dL Final  . Creatinine, Ser 12/21/2018 1.20  0.61 - 1.24 mg/dL Final  . Calcium 12/21/2018 9.3  8.9 - 10.3 mg/dL Final  . Total Protein 12/21/2018 6.6  6.5 - 8.1 g/dL Final  . Albumin 12/21/2018 3.6  3.5 - 5.0 g/dL Final  . AST 12/21/2018 21  15 - 41 U/L Final  . ALT 12/21/2018 13  0 - 44 U/L Final  . Alkaline Phosphatase 12/21/2018 72  38 - 126 U/L Final  . Total Bilirubin 12/21/2018 0.2* 0.3 - 1.2 mg/dL Final  . GFR calc non Af Amer 12/21/2018 57* >60 mL/min Final  . GFR calc Af Amer 12/21/2018 >60  >60 mL/min Final  . Anion gap 12/21/2018 7  5 - 15 Final   Performed at [facility], [address], [place], [place] [address]  . LDH 12/21/2018 126  98 - 192 U/L Final   Performed at [facility], [address], [place], [place] [address]  . Ferritin 12/21/2018 83  24 - 336 ng/mL Final   Performed at [facility], [address] [place], [place] [address]     Pathology Orders Placed This Encounter  Procedures  . CBC with Differential    Standing Status:   Future    Standing Expiration Date:   12/25/2019  . Comprehensive metabolic panel    Standing Status:   Future    Standing Expiration Date:  12/25/2019  . Lactate dehydrogenase    Standing Status:   Future    Standing Expiration Date:   12/25/2019  . Ferritin    Standing Status:   Future    Standing Expiration Date:   12/25/2019       Zoila Shutter MD

## 2018-12-25 ENCOUNTER — Ambulatory Visit: Payer: PPO | Admitting: Internal Medicine

## 2019-01-18 DIAGNOSIS — I10 Essential (primary) hypertension: Secondary | ICD-10-CM | POA: Diagnosis not present

## 2019-01-18 DIAGNOSIS — E785 Hyperlipidemia, unspecified: Secondary | ICD-10-CM | POA: Diagnosis not present

## 2019-01-18 DIAGNOSIS — I2510 Atherosclerotic heart disease of native coronary artery without angina pectoris: Secondary | ICD-10-CM | POA: Diagnosis not present

## 2019-01-18 DIAGNOSIS — D509 Iron deficiency anemia, unspecified: Secondary | ICD-10-CM | POA: Diagnosis not present

## 2019-01-19 ENCOUNTER — Ambulatory Visit: Payer: PPO | Admitting: Internal Medicine

## 2019-03-18 ENCOUNTER — Other Ambulatory Visit (HOSPITAL_COMMUNITY): Payer: Self-pay | Admitting: *Deleted

## 2019-03-18 DIAGNOSIS — D472 Monoclonal gammopathy: Secondary | ICD-10-CM

## 2019-03-18 DIAGNOSIS — D508 Other iron deficiency anemias: Secondary | ICD-10-CM

## 2019-03-18 DIAGNOSIS — D649 Anemia, unspecified: Secondary | ICD-10-CM

## 2019-03-18 DIAGNOSIS — D500 Iron deficiency anemia secondary to blood loss (chronic): Secondary | ICD-10-CM

## 2019-03-18 DIAGNOSIS — E539 Vitamin B deficiency, unspecified: Secondary | ICD-10-CM

## 2019-03-19 ENCOUNTER — Other Ambulatory Visit: Payer: Self-pay

## 2019-03-19 ENCOUNTER — Inpatient Hospital Stay (HOSPITAL_COMMUNITY): Payer: PPO | Attending: Hematology

## 2019-03-19 DIAGNOSIS — I10 Essential (primary) hypertension: Secondary | ICD-10-CM | POA: Diagnosis not present

## 2019-03-19 DIAGNOSIS — D472 Monoclonal gammopathy: Secondary | ICD-10-CM | POA: Insufficient documentation

## 2019-03-19 DIAGNOSIS — D509 Iron deficiency anemia, unspecified: Secondary | ICD-10-CM | POA: Diagnosis not present

## 2019-03-19 DIAGNOSIS — D649 Anemia, unspecified: Secondary | ICD-10-CM

## 2019-03-19 DIAGNOSIS — E539 Vitamin B deficiency, unspecified: Secondary | ICD-10-CM

## 2019-03-19 DIAGNOSIS — F17210 Nicotine dependence, cigarettes, uncomplicated: Secondary | ICD-10-CM | POA: Diagnosis not present

## 2019-03-19 DIAGNOSIS — D500 Iron deficiency anemia secondary to blood loss (chronic): Secondary | ICD-10-CM

## 2019-03-19 DIAGNOSIS — D508 Other iron deficiency anemias: Secondary | ICD-10-CM

## 2019-03-19 LAB — CBC WITH DIFFERENTIAL/PLATELET
Abs Immature Granulocytes: 0.02 10*3/uL (ref 0.00–0.07)
Basophils Absolute: 0.1 10*3/uL (ref 0.0–0.1)
Basophils Relative: 1 %
Eosinophils Absolute: 0.1 10*3/uL (ref 0.0–0.5)
Eosinophils Relative: 2 %
HCT: 34.4 % — ABNORMAL LOW (ref 39.0–52.0)
Hemoglobin: 9.9 g/dL — ABNORMAL LOW (ref 13.0–17.0)
Immature Granulocytes: 0 %
Lymphocytes Relative: 9 %
Lymphs Abs: 0.6 10*3/uL — ABNORMAL LOW (ref 0.7–4.0)
MCH: 24.3 pg — ABNORMAL LOW (ref 26.0–34.0)
MCHC: 28.8 g/dL — ABNORMAL LOW (ref 30.0–36.0)
MCV: 84.5 fL (ref 80.0–100.0)
Monocytes Absolute: 0.8 10*3/uL (ref 0.1–1.0)
Monocytes Relative: 14 %
Neutro Abs: 4.4 10*3/uL (ref 1.7–7.7)
Neutrophils Relative %: 74 %
Platelets: 357 10*3/uL (ref 150–400)
RBC: 4.07 MIL/uL — ABNORMAL LOW (ref 4.22–5.81)
RDW: 16.5 % — ABNORMAL HIGH (ref 11.5–15.5)
WBC: 5.9 10*3/uL (ref 4.0–10.5)
nRBC: 0 % (ref 0.0–0.2)

## 2019-03-19 LAB — COMPREHENSIVE METABOLIC PANEL
ALT: 14 U/L (ref 0–44)
AST: 22 U/L (ref 15–41)
Albumin: 3.8 g/dL (ref 3.5–5.0)
Alkaline Phosphatase: 79 U/L (ref 38–126)
Anion gap: 8 (ref 5–15)
BUN: 19 mg/dL (ref 8–23)
CO2: 24 mmol/L (ref 22–32)
Calcium: 9.2 mg/dL (ref 8.9–10.3)
Chloride: 108 mmol/L (ref 98–111)
Creatinine, Ser: 1.06 mg/dL (ref 0.61–1.24)
GFR calc Af Amer: >60 mL/min (ref >60)
GFR calc non Af Amer: >60 mL/min (ref >60)
Glucose, Bld: 89 mg/dL (ref 70–99)
Potassium: 4.1 mmol/L (ref 3.5–5.1)
Sodium: 140 mmol/L (ref 135–145)
Total Bilirubin: 0.4 mg/dL (ref 0.3–1.2)
Total Protein: 6.9 g/dL (ref 6.5–8.1)

## 2019-03-19 LAB — LACTATE DEHYDROGENASE: LDH: 127 U/L (ref 98–192)

## 2019-03-19 LAB — FERRITIN: Ferritin: 34 ng/mL (ref 24–336)

## 2019-03-26 ENCOUNTER — Ambulatory Visit (HOSPITAL_COMMUNITY): Payer: PPO | Admitting: Hematology

## 2019-04-01 ENCOUNTER — Ambulatory Visit (HOSPITAL_COMMUNITY): Payer: PPO | Admitting: Hematology

## 2019-04-06 ENCOUNTER — Encounter: Payer: Self-pay | Admitting: Internal Medicine

## 2019-04-06 ENCOUNTER — Other Ambulatory Visit: Payer: Self-pay

## 2019-04-06 ENCOUNTER — Ambulatory Visit (INDEPENDENT_AMBULATORY_CARE_PROVIDER_SITE_OTHER): Payer: PPO | Admitting: Internal Medicine

## 2019-04-06 DIAGNOSIS — K219 Gastro-esophageal reflux disease without esophagitis: Secondary | ICD-10-CM

## 2019-04-06 NOTE — Progress Notes (Signed)
Primary Care Physician:  Sinda Du, MD Primary Gastroenterologist:  Dr. Gala Romney  Pre-Procedure History & Physical: HPI:    Referring Provider:  Primary Care Physician:  Sinda Du, MD  Primary GI:   Patient Location: Home   Provider Location: [facility] office   Reason for Visit:    Persons present on the virtual encounter, with roles:    Total time (minutes) spent on medical discussion: ### minutes   Due to COVID-19, visit was conducted using virtual method.  Visit was requested by patient.  Virtual Visit via Telephone Note Due to COVID-19, visit is conducted virtually and was requested by patient.   I connected with Jared Schmidt on 04/06/19 at  2:30 PM EDT by telephone and verified that I am speaking with the correct person using two identifiers.   I discussed the limitations, risks, security and privacy concerns of performing an evaluation and management service by telephone and the availability of in person appointments. I also discussed with the patient that there may be a patient responsible charge related to this service. The patient expressed understanding and agreed to proceed.  Chief Complaint  Patient presents with  . Follow-up    EGD f/u; doing ok; had labs done in 03/19/19     History of Present Illness: Jared Schmidt is a 83 y.o. male here for follow-up of anemia-multifactorial etiology but in part secondary to blood loss secondary to gastric AVM status post therapeutic endoscopy with Hemoclip placement.  His hemoglobin has been hovering in the 10 range since December.  I just spoke to him on the telephone today.  Patient states he feels good.  No melena rectal bleeding abdominal pain appetite described as being great.  No odynophagia, dysphagia early satiety, reflux symptoms nausea or vomiting.  He is scheduled to see his new oncologist later this month.   Past Medical History:  Diagnosis Date  . Anemia   . GERD (gastroesophageal reflux disease)    . Hypercholesteremia   . Hypertension   . Iron deficiency anemia due to chronic blood loss 03/17/2018  . Vitamin B deficiency    patient denies     Past Surgical History:  Procedure Laterality Date  . BIOPSY  06/24/2018   Procedure: BIOPSY;  Surgeon: Daneil Dolin, MD;  [facility];  Service: Endoscopy;;  gastric   . COLONOSCOPY N/A 06/24/2018   Dr. Gala Romney: Internal hemorrhoids, diverticulosis, 9 mm polyp removed from the sigmoid colon which was hyperplastic.  Marland Kitchen ESOPHAGOGASTRODUODENOSCOPY N/A 06/24/2018   Dr. Gala Romney: Multiple erosions in the stomach and duodenal bulb, gastric biopsies with chronic gastritis with intestinal metaplasia, no H. pylori.  . ESOPHAGOGASTRODUODENOSCOPY (EGD) WITH PROPOFOL N/A 09/17/2018   Procedure: ESOPHAGOGASTRODUODENOSCOPY (EGD) WITH PROPOFOL;  Surgeon: Daneil Dolin, MD;  [facility];  Service: Endoscopy;  Laterality: N/A;  11:00am  . GIVENS CAPSULE STUDY N/A 09/15/2018   Procedure: GIVENS CAPSULE STUDY;  Surgeon: Daneil Dolin, MD;  [facility];  Service: Endoscopy;  Laterality: N/A;  7:30am  . none    . POLYPECTOMY  06/24/2018   Procedure: POLYPECTOMY;  Surgeon: Daneil Dolin, MD;  [facility];  Service: Endoscopy;;  sigmoid polyp hs     Current Meds  Medication Sig  . alfuzosin (UROXATRAL) 10 MG 24 hr tablet Take 10 mg by mouth at bedtime.  Marland Kitchen amLODipine (NORVASC) 5 MG tablet Take 5 mg by mouth daily.  Marland Kitchen aspirin EC 81 MG tablet Take 81  mg by mouth daily.  Marland Kitchen atorvastatin (LIPITOR) 10 MG tablet Take 10 mg by mouth daily with supper.   . carvedilol (COREG) 12.5 MG tablet Take 12.5 mg by mouth 2 (two) times daily with a meal.  . clotrimazole-betamethasone (LOTRISONE) cream Apply 1 application topically 2 (two) times daily.  . Ferrous Sulfate (IRON) 325 (65 Fe) MG TABS Take 1 tablet by mouth daily.  . Multiple Vitamins-Minerals (CENTRUM SILVER PO) Take 1 tablet by mouth daily.  . pantoprazole (PROTONIX)  40 MG tablet Take 40 mg by mouth daily.   . vitamin C (ASCORBIC ACID) 500 MG tablet Take 500 mg by mouth daily.  . vitamin E 100 UNIT capsule Take 100 Units by mouth daily.      Family History  Problem Relation Age of Onset  . Colon cancer Neg Hx     Social History   Socioeconomic History  . Marital status: Married    Spouse name: Tyse Auriemma  . Number of children: Not on file  . Years of education: Not on file  . Highest education level: 8th grade  Occupational History  . Not on file  Social Needs  . Financial resource strain: Not on file  . Food insecurity:    Worry: Not on file    Inability: Not on file  . Transportation needs:    Medical: Not on file    Non-medical: Not on file  Tobacco Use  . Smoking status: Former Smoker    Packs/day: 0.50    Years: 67.00    Pack years: 33.50    Types: Cigarettes    Last attempt to quit: 12/11/2017    Years since quitting: 1.3  . Smokeless tobacco: Never Used  Substance and Sexual Activity  . Alcohol use: No    Frequency: Never  . Drug use: No  . Sexual activity: Not Currently  Lifestyle  . Physical activity:    Days per week: Not on file    Minutes per session: Not on file  . Stress: Not on file  Relationships  . Social connections:    Talks on phone: Not on file    Gets together: Not on file    Attends religious service: Not on file    Active member of club or organization: Not on file    Attends meetings of clubs or organizations: Not on file    Relationship status: Not on file  Other Topics Concern  . Not on file  Social History Narrative  . Not on file       Review of Systems: As in history of present illness    Observations/Objective: No distress. Unable to perform physical exam due to telephone encounter. No video available.   Assessment and Plan: Pleasant 83 year old gentleman with multiple comorbidities history of anemia, in part, secondary to GI blood loss  -  status post endoscopic therapy of  bleeding gastric AVMs.  Clinically, sounds sounds like patient is doing very well.  Anemia is stable at this time   Follow Up Instructions:  No further GI evaluation warranted.  We will plan to see him back in 6 months.  Encouraged him to keep his hematology visit later this month.  His clinical status change, will be happy to see him back at any time.        I discussed the assessment and treatment plan with the patient. The patient was provided an opportunity to ask questions and all were answered. The patient agreed with the plan and  demonstrated an understanding of the instructions.   The patient was advised to call back or seek an in-person evaluation if the symptoms worsen or if the condition fails to improve as anticipated.  I provided 8 minutes of non-face-to-face time during this encounter.    Daneil Dolin, MD [facility]

## 2019-04-27 ENCOUNTER — Encounter (HOSPITAL_COMMUNITY): Payer: Self-pay | Admitting: Hematology

## 2019-04-27 ENCOUNTER — Other Ambulatory Visit: Payer: Self-pay

## 2019-04-27 ENCOUNTER — Inpatient Hospital Stay (HOSPITAL_COMMUNITY): Payer: PPO | Attending: Hematology | Admitting: Hematology

## 2019-04-27 VITALS — BP 142/72 | HR 80 | Temp 97.9°F | Resp 18 | Wt 147.5 lb

## 2019-04-27 DIAGNOSIS — D500 Iron deficiency anemia secondary to blood loss (chronic): Secondary | ICD-10-CM | POA: Insufficient documentation

## 2019-04-27 DIAGNOSIS — K5731 Diverticulosis of large intestine without perforation or abscess with bleeding: Secondary | ICD-10-CM | POA: Insufficient documentation

## 2019-04-27 NOTE — Assessment & Plan Note (Addendum)
1.  Iron deficiency anemia: -Likely from combination of chronic GI loss and malabsorption. - Denies any bleeding per rectum or melena. -EGD on 09/17/2018 shows normal esophagus, red blood in the entire stomach, 3 actively bleeding AVMs versus Dieulafoy's-sealed with clips.  Normal duodenal bulb, second part of duodenum and third part of duodenum. -Colonoscopy on 06/24/2018 shows internal hemorrhoids, few medium mouth diverticula in the entire colon.  Subcentimeter polyp in the sigmoid colon. -Bone marrow biopsy on 08/31/2018 shows normocellular marrow with erythroid hyperplasia.  There is no increase in plasma cells. - Last Feraheme infusion on 11/13/2018 and 11/20/2018. -We reviewed his blood work from 03/19/2019.  Hemoglobin is down to 9.9.  Ferritin is 34.  MCV is 84. -He also has intermittent elevation of creatinine. - Based on the labs, I have recommended 2 more infusions of Feraheme.  I have called his wife and talk to her. - We will see him back in 3 months with repeat labs.

## 2019-04-27 NOTE — Progress Notes (Signed)
[place], [place] [address]   CLINIC:  Medical Oncology/Hematology  PCP:  Sinda Du, [place] [address] [phone]   REASON FOR VISIT:  Follow-up for iron deficiency state and anemia.  CURRENT THERAPY: Intermittent Feraheme infusions.    INTERVAL HISTORY:  Jared Schmidt 83 y.o. male seen for follow-up of iron deficiency state.  Denies any bleeding per rectum or melena.  Appetite and energy levels are reported as 100%.  Denies any fevers, night sweats or weight loss in the last 3 to 4 months.  Denies any ER visits or hospitalizations.  No chest pains or lightheadedness reported.  No change in bowel habits.  Denies any nausea, vomiting, diarrhea or constipation.    REVIEW OF SYSTEMS:  Review of Systems  All other systems reviewed and are negative.    PAST MEDICAL/SURGICAL HISTORY:  Past Medical History:  Diagnosis Date  . Anemia   . GERD (gastroesophageal reflux disease)   . Hypercholesteremia   . Hypertension   . Iron deficiency anemia due to chronic blood loss 03/17/2018  . Vitamin B deficiency    patient denies   Past Surgical History:  Procedure Laterality Date  . BIOPSY  06/24/2018   Procedure: BIOPSY;  Surgeon: Daneil Dolin, MD;  [facility];  Service: Endoscopy;;  gastric   . COLONOSCOPY N/A 06/24/2018   Dr. Gala Romney: Internal hemorrhoids, diverticulosis, 9 mm polyp removed from the sigmoid colon which was hyperplastic.  Marland Kitchen ESOPHAGOGASTRODUODENOSCOPY N/A 06/24/2018   Dr. Gala Romney: Multiple erosions in the stomach and duodenal bulb, gastric biopsies with chronic gastritis with intestinal metaplasia, no H. pylori.  . ESOPHAGOGASTRODUODENOSCOPY (EGD) WITH PROPOFOL N/A 09/17/2018   Procedure: ESOPHAGOGASTRODUODENOSCOPY (EGD) WITH PROPOFOL;  Surgeon: Daneil Dolin, MD;  [facility];  Service: Endoscopy;  Laterality: N/A;  11:00am  . GIVENS CAPSULE STUDY N/A 09/15/2018   Procedure: GIVENS CAPSULE STUDY;  Surgeon: Daneil Dolin, MD;  [facility];  Service: Endoscopy;  Laterality: N/A;  7:30am  . none    . POLYPECTOMY  06/24/2018   Procedure: POLYPECTOMY;  Surgeon: Daneil Dolin, MD;  [facility];  Service: Endoscopy;;  sigmoid polyp hs     SOCIAL HISTORY:  Social History   Socioeconomic History  . Marital status: Married    Spouse name: Undra Harriman  . Number of children: Not on file  . Years of education: Not on file  . Highest education level: 8th grade  Occupational History  . Not on file  Social Needs  . Financial resource strain: Not on file  . Food insecurity    Worry: Not on file    Inability: Not on file  . Transportation needs    Medical: Not on file    Non-medical: Not on file  Tobacco Use  . Smoking status: Former Smoker    Packs/day: 0.50    Years: 67.00    Pack years: 33.50    Types: Cigarettes    Quit date: 12/11/2017    Years since quitting: 1.3  . Smokeless tobacco: Never Used  Substance and Sexual Activity  . Alcohol use: No    Frequency: Never  . Drug use: No  . Sexual activity: Not Currently  Lifestyle  . Physical activity    Days per week: Not on file    Minutes per session: Not on file  . Stress: Not on file  Relationships  . Social connections  Talks on phone: Not on file    Gets together: Not on file    Attends religious service: Not on file    Active member of club or organization: Not on file    Attends meetings of clubs or organizations: Not on file    Relationship status: Not on file  . Intimate partner violence    Fear of current or ex partner: Not on file    Emotionally abused: Not on file    Physically abused: Not on file    Forced sexual activity: Not on file  Other Topics Concern  . Not on file  Social History Narrative  . Not on file    FAMILY HISTORY:  Family History  Problem Relation Age of Onset  . Colon cancer Neg Hx     CURRENT MEDICATIONS:   Outpatient Encounter Medications as of 04/27/2019  Medication Sig  . alfuzosin (UROXATRAL) 10 MG 24 hr tablet Take 10 mg by mouth at bedtime.  Marland Kitchen amLODipine (NORVASC) 5 MG tablet Take 5 mg by mouth daily.  Marland Kitchen aspirin EC 81 MG tablet Take 81 mg by mouth daily.  Marland Kitchen atorvastatin (LIPITOR) 10 MG tablet Take 10 mg by mouth daily with supper.   . carvedilol (COREG) 12.5 MG tablet Take 12.5 mg by mouth 2 (two) times daily with a meal.  . clotrimazole-betamethasone (LOTRISONE) cream Apply 1 application topically 2 (two) times daily.  . Ferrous Sulfate (IRON) 325 (65 Fe) MG TABS Take 1 tablet by mouth daily.  . Multiple Vitamins-Minerals (CENTRUM SILVER PO) Take 1 tablet by mouth daily.  . pantoprazole (PROTONIX) 40 MG tablet Take 40 mg by mouth daily.   . vitamin C (ASCORBIC ACID) 500 MG tablet Take 500 mg by mouth daily.  . vitamin E 100 UNIT capsule Take 100 Units by mouth daily.    No facility-administered encounter medications on file as of 04/27/2019.     ALLERGIES:  No Known Allergies   PHYSICAL EXAM:  ECOG Performance status: 1  Vitals:   04/27/19 1516  BP: (!) 142/72  Pulse: 80  Resp: 18  Temp: 97.9 F (36.6 C)  SpO2: 100%   Filed Weights   04/27/19 1516  Weight: 147 lb 8 oz (66.9 kg)    Physical Exam Constitutional:      Appearance: Normal appearance.  Cardiovascular:     Rate and Rhythm: Normal rate and regular rhythm.     Heart sounds: Normal heart sounds.  Pulmonary:     Effort: Pulmonary effort is normal.     Breath sounds: Normal breath sounds.  Abdominal:     General: There is no distension.     Palpations: Abdomen is soft. There is no mass.  Musculoskeletal:        General: No swelling.  Skin:    General: Skin is warm.  Neurological:     General: No focal deficit present.     Mental Status: He is alert and oriented to person, place, and time.  Psychiatric:        Mood and Affect: Mood normal.        Behavior: Behavior normal.      LABORATORY DATA:   I have reviewed the labs as listed.  CBC    Component Value Date/Time   WBC 5.9 03/19/2019 1106   RBC 4.07 (L) 03/19/2019 1106   HGB 9.9 (L) 03/19/2019 1106   HCT 34.4 (L) 03/19/2019 1106   PLT 357 03/19/2019 1106   MCV 84.5 03/19/2019 1106  MCH 24.3 (L) 03/19/2019 1106   MCHC 28.8 (L) 03/19/2019 1106   RDW 16.5 (H) 03/19/2019 1106   LYMPHSABS 0.6 (L) 03/19/2019 1106   MONOABS 0.8 03/19/2019 1106   EOSABS 0.1 03/19/2019 1106   BASOSABS 0.1 03/19/2019 1106   CMP Latest Ref Rng & Units 03/19/2019 12/21/2018 11/06/2018  Glucose 70 - 99 mg/dL 89 98 95  BUN 8 - 23 mg/dL _0 Creatinine 0.61 - 1.24 mg/dL 1.06 1.20 1.51(H)  Sodium 135 - 145 mmol/L 140 141 139  Potassium 3.5 - 5.1 mmol/L 4.1 4.0 4.4  Chloride 98 - 111 mmol/L 108 109 108  CO2 22 - 32 mmol/L _1 Calcium 8.9 - 10.3 mg/dL 9.2 9.3 9.3  Total Protein 6.5 - 8.1 g/dL 6.9 6.6 6.9  Total Bilirubin 0.3 - 1.2 mg/dL 0.4 0.2(L) 0.2(L)  Alkaline Phos 38 - 126 U/L 79 72 87  AST 15 - 41 U/L _2 ALT 0 - 44 U/L _3 DIAGNOSTIC IMAGING:  I have independently reviewed the scans and discussed with the patient.      ASSESSMENT & PLAN:   Iron deficiency anemia due to chronic blood loss 1.  Iron deficiency anemia: -Likely from combination of chronic GI loss and malabsorption. - Denies any bleeding per rectum or melena. -EGD on 09/17/2018 shows normal esophagus, red blood in the entire stomach, 3 actively bleeding AVMs versus Dieulafoy's-sealed with clips.  Normal duodenal bulb, second part of duodenum and third part of duodenum. -Colonoscopy on 06/24/2018 shows internal hemorrhoids, few medium mouth diverticula in the entire colon.  Subcentimeter polyp in the sigmoid colon. -Bone marrow biopsy on 08/31/2018 shows normocellular marrow with erythroid hyperplasia.  There is no increase in plasma cells. - Last Feraheme infusion on 11/13/2018 and 11/20/2018. -We reviewed his blood work from 03/19/2019.   Hemoglobin is down to 9.9.  Ferritin is 34.  MCV is 84. -He also has intermittent elevation of creatinine. - Based on the labs, I have recommended 2 more infusions of Feraheme.  I have called his wife and talk to her. - We will see him back in 3 months with repeat labs.      Orders placed this encounter:  Orders Placed This Encounter  Procedures  . CBC with Differential  . Comprehensive metabolic panel  . Lactate dehydrogenase  . Ferritin  . Iron and TIBC      Derek Jack, MD [place] [phone]

## 2019-05-06 ENCOUNTER — Other Ambulatory Visit: Payer: Self-pay

## 2019-05-06 ENCOUNTER — Inpatient Hospital Stay (HOSPITAL_COMMUNITY): Payer: PPO | Attending: Hematology

## 2019-05-06 VITALS — BP 154/75 | HR 73 | Temp 97.9°F | Resp 18

## 2019-05-06 DIAGNOSIS — D500 Iron deficiency anemia secondary to blood loss (chronic): Secondary | ICD-10-CM | POA: Insufficient documentation

## 2019-05-06 DIAGNOSIS — K5731 Diverticulosis of large intestine without perforation or abscess with bleeding: Secondary | ICD-10-CM | POA: Insufficient documentation

## 2019-05-06 MED ORDER — SODIUM CHLORIDE 0.9 % IV SOLN
INTRAVENOUS | Status: DC
  Administered 2019-05-06: 14:00:00 via INTRAVENOUS

## 2019-05-06 MED ORDER — SODIUM CHLORIDE 0.9 % IV SOLN
510.0000 mg | Freq: Once | INTRAVENOUS | Status: AC
  Administered 2019-05-06: 510 mg via INTRAVENOUS
  Filled 2019-05-06: qty 510

## 2019-05-06 NOTE — Progress Notes (Signed)
Patient tolerated iron infusion with no complaints voiced.  Peripheral IV site with good blood return noted before and after infusion.  No bruising or swelling noted at site and patient denied pain.  Band aid applied.  VSS with discharge and left ambulatory with no s/s of distress noted.  

## 2019-05-13 ENCOUNTER — Other Ambulatory Visit: Payer: Self-pay

## 2019-05-13 ENCOUNTER — Encounter (HOSPITAL_COMMUNITY): Payer: Self-pay

## 2019-05-13 ENCOUNTER — Inpatient Hospital Stay (HOSPITAL_COMMUNITY): Payer: PPO

## 2019-05-13 VITALS — BP 144/73 | HR 74 | Temp 97.5°F | Resp 18

## 2019-05-13 DIAGNOSIS — D500 Iron deficiency anemia secondary to blood loss (chronic): Secondary | ICD-10-CM

## 2019-05-13 DIAGNOSIS — K5731 Diverticulosis of large intestine without perforation or abscess with bleeding: Secondary | ICD-10-CM | POA: Diagnosis not present

## 2019-05-13 MED ORDER — SODIUM CHLORIDE 0.9 % IV SOLN
510.0000 mg | Freq: Once | INTRAVENOUS | Status: AC
  Administered 2019-05-13: 510 mg via INTRAVENOUS
  Filled 2019-05-13: qty 510

## 2019-05-13 MED ORDER — SODIUM CHLORIDE 0.9 % IV SOLN
INTRAVENOUS | Status: DC
  Administered 2019-05-13: 14:00:00 via INTRAVENOUS

## 2019-05-13 NOTE — Patient Instructions (Signed)
[facility] Discharge Instructions for Patients Receiving Chemotherapy  Today you received the following chemotherapy agents   To help prevent nausea and vomiting after your treatment, we encourage you to take your nausea medication   If you develop nausea and vomiting that is not controlled by your nausea medication, call the clinic.   BELOW ARE SYMPTOMS THAT SHOULD BE REPORTED IMMEDIATELY:  *FEVER GREATER THAN 100.5 F  *CHILLS WITH OR WITHOUT FEVER  NAUSEA AND VOMITING THAT IS NOT CONTROLLED WITH YOUR NAUSEA MEDICATION  *UNUSUAL SHORTNESS OF BREATH  *UNUSUAL BRUISING OR BLEEDING  TENDERNESS IN MOUTH AND THROAT WITH OR WITHOUT PRESENCE OF ULCERS  *URINARY PROBLEMS  *BOWEL PROBLEMS  UNUSUAL RASH Items with * indicate a potential emergency and should be followed up as soon as possible.  Feel free to call the clinic should you have any questions or concerns. The clinic phone number is [phone].  Please show the CHEMO ALERT CARD at check-in to the Emergency Department and triage nurse.   

## 2019-05-13 NOTE — Progress Notes (Signed)
Feraheme given per orders. Patient tolerated it well without problems. Vitals stable and discharged home from clinic ambulatory. Follow up as scheduled.  

## 2019-06-29 ENCOUNTER — Ambulatory Visit (HOSPITAL_COMMUNITY): Payer: PPO | Admitting: Hematology

## 2019-06-30 ENCOUNTER — Ambulatory Visit (INDEPENDENT_AMBULATORY_CARE_PROVIDER_SITE_OTHER): Payer: PPO | Admitting: Urology

## 2019-06-30 DIAGNOSIS — N401 Enlarged prostate with lower urinary tract symptoms: Secondary | ICD-10-CM | POA: Diagnosis not present

## 2019-07-20 ENCOUNTER — Inpatient Hospital Stay (HOSPITAL_COMMUNITY): Payer: PPO | Attending: Hematology

## 2019-07-20 ENCOUNTER — Other Ambulatory Visit: Payer: Self-pay

## 2019-07-20 DIAGNOSIS — Z87891 Personal history of nicotine dependence: Secondary | ICD-10-CM | POA: Insufficient documentation

## 2019-07-20 DIAGNOSIS — E7800 Pure hypercholesterolemia, unspecified: Secondary | ICD-10-CM | POA: Insufficient documentation

## 2019-07-20 DIAGNOSIS — Z7982 Long term (current) use of aspirin: Secondary | ICD-10-CM | POA: Diagnosis not present

## 2019-07-20 DIAGNOSIS — Z23 Encounter for immunization: Secondary | ICD-10-CM | POA: Diagnosis not present

## 2019-07-20 DIAGNOSIS — E539 Vitamin B deficiency, unspecified: Secondary | ICD-10-CM | POA: Insufficient documentation

## 2019-07-20 DIAGNOSIS — D500 Iron deficiency anemia secondary to blood loss (chronic): Secondary | ICD-10-CM

## 2019-07-20 DIAGNOSIS — I10 Essential (primary) hypertension: Secondary | ICD-10-CM | POA: Insufficient documentation

## 2019-07-20 DIAGNOSIS — K219 Gastro-esophageal reflux disease without esophagitis: Secondary | ICD-10-CM | POA: Diagnosis not present

## 2019-07-20 DIAGNOSIS — D509 Iron deficiency anemia, unspecified: Secondary | ICD-10-CM | POA: Insufficient documentation

## 2019-07-20 LAB — CBC WITH DIFFERENTIAL/PLATELET
Abs Immature Granulocytes: 0.01 10*3/uL (ref 0.00–0.07)
Basophils Absolute: 0 10*3/uL (ref 0.0–0.1)
Basophils Relative: 1 %
Eosinophils Absolute: 0.2 10*3/uL (ref 0.0–0.5)
Eosinophils Relative: 3 %
HCT: 36.2 % — ABNORMAL LOW (ref 39.0–52.0)
Hemoglobin: 10.5 g/dL — ABNORMAL LOW (ref 13.0–17.0)
Immature Granulocytes: 0 %
Lymphocytes Relative: 13 %
Lymphs Abs: 0.7 10*3/uL (ref 0.7–4.0)
MCH: 24.9 pg — ABNORMAL LOW (ref 26.0–34.0)
MCHC: 29 g/dL — ABNORMAL LOW (ref 30.0–36.0)
MCV: 86 fL (ref 80.0–100.0)
Monocytes Absolute: 0.6 10*3/uL (ref 0.1–1.0)
Monocytes Relative: 11 %
Neutro Abs: 3.7 10*3/uL (ref 1.7–7.7)
Neutrophils Relative %: 72 %
Platelets: 370 10*3/uL (ref 150–400)
RBC: 4.21 MIL/uL — ABNORMAL LOW (ref 4.22–5.81)
RDW: 18 % — ABNORMAL HIGH (ref 11.5–15.5)
WBC: 5.2 10*3/uL (ref 4.0–10.5)
nRBC: 0 % (ref 0.0–0.2)

## 2019-07-20 LAB — COMPREHENSIVE METABOLIC PANEL
ALT: 14 U/L (ref 0–44)
AST: 22 U/L (ref 15–41)
Albumin: 3.7 g/dL (ref 3.5–5.0)
Alkaline Phosphatase: 91 U/L (ref 38–126)
Anion gap: 8 (ref 5–15)
BUN: 14 mg/dL (ref 8–23)
CO2: 23 mmol/L (ref 22–32)
Calcium: 9.2 mg/dL (ref 8.9–10.3)
Chloride: 109 mmol/L (ref 98–111)
Creatinine, Ser: 1.03 mg/dL (ref 0.61–1.24)
GFR calc Af Amer: >60 mL/min (ref >60)
GFR calc non Af Amer: >60 mL/min (ref >60)
Glucose, Bld: 120 mg/dL — ABNORMAL HIGH (ref 70–99)
Potassium: 4.1 mmol/L (ref 3.5–5.1)
Sodium: 140 mmol/L (ref 135–145)
Total Bilirubin: 0.5 mg/dL (ref 0.3–1.2)
Total Protein: 6.7 g/dL (ref 6.5–8.1)

## 2019-07-20 LAB — LACTATE DEHYDROGENASE: LDH: 128 U/L (ref 98–192)

## 2019-07-20 LAB — FERRITIN: Ferritin: 41 ng/mL (ref 24–336)

## 2019-07-20 LAB — IRON AND TIBC
Iron: 21 ug/dL — ABNORMAL LOW (ref 45–182)
Saturation Ratios: 6 % — ABNORMAL LOW (ref 17.9–39.5)
TIBC: 342 ug/dL (ref 250–450)
UIBC: 321 ug/dL

## 2019-07-26 ENCOUNTER — Other Ambulatory Visit: Payer: Self-pay

## 2019-07-26 ENCOUNTER — Encounter (HOSPITAL_COMMUNITY): Payer: Self-pay | Admitting: Hematology

## 2019-07-26 ENCOUNTER — Inpatient Hospital Stay (HOSPITAL_BASED_OUTPATIENT_CLINIC_OR_DEPARTMENT_OTHER): Payer: PPO | Admitting: Hematology

## 2019-07-26 VITALS — BP 132/64 | HR 83 | Temp 97.1°F | Resp 16 | Wt 150.0 lb

## 2019-07-26 DIAGNOSIS — D509 Iron deficiency anemia, unspecified: Secondary | ICD-10-CM | POA: Diagnosis not present

## 2019-07-26 DIAGNOSIS — Z0000 Encounter for general adult medical examination without abnormal findings: Secondary | ICD-10-CM

## 2019-07-26 DIAGNOSIS — D500 Iron deficiency anemia secondary to blood loss (chronic): Secondary | ICD-10-CM | POA: Diagnosis not present

## 2019-07-26 MED ORDER — INFLUENZA VAC A&B SA ADJ QUAD 0.5 ML IM PRSY
0.5000 mL | PREFILLED_SYRINGE | INTRAMUSCULAR | Status: AC
  Administered 2019-07-26: 0.5 mL via INTRAMUSCULAR
  Filled 2019-07-26: qty 0.5

## 2019-07-26 NOTE — Progress Notes (Signed)
[place], [place] [address]   CLINIC:  Medical Oncology/Hematology  PCP:  Sinda Du, [place] [address] [phone]   REASON FOR VISIT:  Follow-up for iron deficiency state and anemia.  CURRENT THERAPY: Intermittent Feraheme infusions.    INTERVAL HISTORY:  Jared Schmidt 83 y.o. male seen for follow-up of iron deficiency state.  Denies any bleeding per rectum or melena.  Wife is on the phone during the visit.  Appetite is 100%.  Energy levels are 75%.  Denies any fevers or night sweats or weight loss in the last 3 months.  Denies any infections or hospitalizations.  No blood transfusions in the last 6 months.  Last Feraheme infusion was on 05/13/2019.  He did not experience any improvement in energy after last iron infusion.    REVIEW OF SYSTEMS:  Review of Systems  All other systems reviewed and are negative.    PAST MEDICAL/SURGICAL HISTORY:  Past Medical History:  Diagnosis Date  . Anemia   . GERD (gastroesophageal reflux disease)   . Hypercholesteremia   . Hypertension   . Iron deficiency anemia due to chronic blood loss 03/17/2018  . Vitamin B deficiency    patient denies   Past Surgical History:  Procedure Laterality Date  . BIOPSY  06/24/2018   Procedure: BIOPSY;  Surgeon: Daneil Dolin, MD;  [facility];  Service: Endoscopy;;  gastric   . COLONOSCOPY N/A 06/24/2018   Dr. Gala Romney: Internal hemorrhoids, diverticulosis, 9 mm polyp removed from the sigmoid colon which was hyperplastic.  Marland Kitchen ESOPHAGOGASTRODUODENOSCOPY N/A 06/24/2018   Dr. Gala Romney: Multiple erosions in the stomach and duodenal bulb, gastric biopsies with chronic gastritis with intestinal metaplasia, no H. pylori.  . ESOPHAGOGASTRODUODENOSCOPY (EGD) WITH PROPOFOL N/A 09/17/2018   Procedure: ESOPHAGOGASTRODUODENOSCOPY (EGD) WITH PROPOFOL;  Surgeon: Daneil Dolin, MD;  [facility];  Service: Endoscopy;  Laterality:  N/A;  11:00am  . GIVENS CAPSULE STUDY N/A 09/15/2018   Procedure: GIVENS CAPSULE STUDY;  Surgeon: Daneil Dolin, MD;  [facility];  Service: Endoscopy;  Laterality: N/A;  7:30am  . none    . POLYPECTOMY  06/24/2018   Procedure: POLYPECTOMY;  Surgeon: Daneil Dolin, MD;  [facility];  Service: Endoscopy;;  sigmoid polyp hs     SOCIAL HISTORY:  Social History   Socioeconomic History  . Marital status: Married    Spouse name: Dante Roudebush  . Number of children: Not on file  . Years of education: Not on file  . Highest education level: 8th grade  Occupational History  . Not on file  Social Needs  . Financial resource strain: Not on file  . Food insecurity    Worry: Not on file    Inability: Not on file  . Transportation needs    Medical: Not on file    Non-medical: Not on file  Tobacco Use  . Smoking status: Former Smoker    Packs/day: 0.50    Years: 67.00    Pack years: 33.50    Types: Cigarettes    Quit date: 12/11/2017    Years since quitting: 1.6  . Smokeless tobacco: Never Used  Substance and Sexual Activity  . Alcohol use: No    Frequency: Never  . Drug use: No  . Sexual activity: Not Currently  Lifestyle  . Physical activity    Days per week: Not on file    Minutes per session: Not  on file  . Stress: Not on file  Relationships  . Social Herbalist on phone: Not on file    Gets together: Not on file    Attends religious service: Not on file    Active member of club or organization: Not on file    Attends meetings of clubs or organizations: Not on file    Relationship status: Not on file  . Intimate partner violence    Fear of current or ex partner: Not on file    Emotionally abused: Not on file    Physically abused: Not on file    Forced sexual activity: Not on file  Other Topics Concern  . Not on file  Social History Narrative  . Not on file    FAMILY HISTORY:  Family History  Problem Relation Age of Onset  .  Colon cancer Neg Hx     CURRENT MEDICATIONS:  Outpatient Encounter Medications as of 07/26/2019  Medication Sig  . alfuzosin (UROXATRAL) 10 MG 24 hr tablet Take 10 mg by mouth at bedtime.  Marland Kitchen amLODipine (NORVASC) 5 MG tablet Take 5 mg by mouth daily.  Marland Kitchen aspirin EC 81 MG tablet Take 81 mg by mouth daily.  Marland Kitchen atorvastatin (LIPITOR) 10 MG tablet Take 10 mg by mouth daily with supper.   . carvedilol (COREG) 12.5 MG tablet Take 12.5 mg by mouth 2 (two) times daily with a meal.  . Ferrous Sulfate (IRON) 325 (65 Fe) MG TABS Take 1 tablet by mouth daily.  . Multiple Vitamins-Minerals (CENTRUM SILVER PO) Take 1 tablet by mouth daily.  . pantoprazole (PROTONIX) 40 MG tablet Take 40 mg by mouth daily.   . vitamin C (ASCORBIC ACID) 500 MG tablet Take 500 mg by mouth daily.  . vitamin E 100 UNIT capsule Take 100 Units by mouth daily.   . clotrimazole-betamethasone (LOTRISONE) cream Apply 1 application topically 2 (two) times daily.  . [EXPIRED] influenza vaccine adjuvanted (FLUAD) injection 0.5 mL    No facility-administered encounter medications on file as of 07/26/2019.     ALLERGIES:  No Known Allergies   PHYSICAL EXAM:  ECOG Performance status: 1  Vitals:   07/26/19 1106  BP: 132/64  Pulse: 83  Resp: 16  Temp: (!) 97.1 F (36.2 C)  SpO2: 99%   Filed Weights   07/26/19 1106  Weight: 150 lb (68 kg)    Physical Exam Constitutional:      Appearance: Normal appearance.  Cardiovascular:     Rate and Rhythm: Normal rate and regular rhythm.     Heart sounds: Normal heart sounds.  Pulmonary:     Effort: Pulmonary effort is normal.     Breath sounds: Normal breath sounds.  Abdominal:     General: There is no distension.     Palpations: Abdomen is soft. There is no mass.  Musculoskeletal:        General: No swelling.  Skin:    General: Skin is warm.  Neurological:     General: No focal deficit present.     Mental Status: He is alert and oriented to person, place, and time.   Psychiatric:        Mood and Affect: Mood normal.        Behavior: Behavior normal.      LABORATORY DATA:  I have reviewed the labs as listed.  CBC    Component Value Date/Time   WBC 5.2 07/20/2019 1226   RBC 4.21 (L) 07/20/2019 1226  HGB 10.5 (L) 07/20/2019 1226   HCT 36.2 (L) 07/20/2019 1226   PLT 370 07/20/2019 1226   MCV 86.0 07/20/2019 1226   MCH 24.9 (L) 07/20/2019 1226   MCHC 29.0 (L) 07/20/2019 1226   RDW 18.0 (H) 07/20/2019 1226   LYMPHSABS 0.7 07/20/2019 1226   MONOABS 0.6 07/20/2019 1226   EOSABS 0.2 07/20/2019 1226   BASOSABS 0.0 07/20/2019 1226   CMP Latest Ref Rng & Units 07/20/2019 03/19/2019 12/21/2018  Glucose 70 - 99 mg/dL 120(H) 89 98  BUN 8 - 23 mg/dL '14 19 19  ' Creatinine 0.61 - 1.24 mg/dL 1.03 1.06 1.20  Sodium 135 - 145 mmol/L 140 140 141  Potassium 3.5 - 5.1 mmol/L 4.1 4.1 4.0  Chloride 98 - 111 mmol/L 109 108 109  CO2 22 - 32 mmol/L '23 24 25  ' Calcium 8.9 - 10.3 mg/dL 9.2 9.2 9.3  Total Protein 6.5 - 8.1 g/dL 6.7 6.9 6.6  Total Bilirubin 0.3 - 1.2 mg/dL 0.5 0.4 0.2(L)  Alkaline Phos 38 - 126 U/L 91 79 72  AST 15 - 41 U/L '22 22 21  ' ALT 0 - 44 U/L '14 14 13       ' DIAGNOSTIC IMAGING:  I have independently reviewed the scans and discussed with the patient.      ASSESSMENT & PLAN:   Iron deficiency anemia due to chronic blood loss 1.  Iron deficiency anemia: -Likely from combination of chronic GI blood loss and malabsorption. - EGD on 09/17/2018 shows normal esophagus, blood in the entire stomach, 3 actively bleeding AVMs versus Dieulafoy's, sealed with clips.  Normal duodenal bulb and second part of duodenum. -Colonoscopy on 06/24/2018 shows internal hemorrhoids, few medium mouth diverticula in the entire colon.  Subcentimeter polyp in the sigmoid colon. -Bone marrow biopsy on 08/21/2018 shows normocellular marrow with erythroid hyperplasia.  No increase in plasma cells. -Denies any bleeding per rectum or melena. -He is taking aspirin 81 mg  daily and iron tablet daily.  According to the wife, he reportedly had a stroke and was placed on aspirin. -Last Feraheme was on 05/06/2019 and 05/13/2019. - I have reviewed his labs.  Ferritin is 41, up from 34, 3 months ago.  Hemoglobin is 10.5 with MCV of 86. - Based on the low ferritin, I have recommended 2 more infusions of Feraheme. - I will reevaluate him in 3 months with repeat labs.  We will also check P12 and folic acid levels at that time.   Total time spent is 25 minutes with more than 50% of the time spent face-to-face discussing treatment plan, counseling and coordination of care.  Orders placed this encounter:  Orders Placed This Encounter  Procedures  . CBC with Differential/Platelet  . Comprehensive metabolic panel  . Iron and TIBC  . Ferritin  . Vitamin B12  . Folate      Derek Jack, MD [place] [phone]

## 2019-07-26 NOTE — Patient Instructions (Addendum)
[place] at [facility] Discharge Instructions  You were seen today by Dr. Delton Coombes. He went over your recent lab results. He will schedule you for 2 IV Iron infusions. He will see you back in 3 months for labs and follow up.    Thank you for choosing [place] at [facility] to provide your oncology and hematology care.  To afford each patient quality time with our provider, please arrive at least 15 minutes before your scheduled appointment time.   If you have a lab appointment with the [facility] please come in thru the  Main Entrance and check in at the main information desk  You need to re-schedule your appointment should you arrive 10 or more minutes late.  We strive to give you quality time with our providers, and arriving late affects you and other patients whose appointments are after yours.  Also, if you no show three or more times for appointments you may be dismissed from the clinic at the providers discretion.     Again, thank you for choosing [facility].  Our hope is that these requests will decrease the amount of time that you wait before being seen by our physicians.       _____________________________________________________________  Should you have questions after your visit to [facility], please contact our office at [phone] between the hours of 8:00 a.m. and 4:30 p.m.  Voicemails left after 4:00 p.m. will not be returned until the following business day.  For prescription refill requests, have your pharmacy contact our office and allow 72 hours.    Cancer Center Support Programs:   > Cancer Support Group  2nd Tuesday of the month 1pm-2pm, Journey Room

## 2019-07-26 NOTE — Assessment & Plan Note (Signed)
1.  Iron deficiency anemia: -Likely from combination of chronic GI blood loss and malabsorption. - EGD on 09/17/2018 shows normal esophagus, blood in the entire stomach, 3 actively bleeding AVMs versus Dieulafoy's, sealed with clips.  Normal duodenal bulb and second part of duodenum. -Colonoscopy on 06/24/2018 shows internal hemorrhoids, few medium mouth diverticula in the entire colon.  Subcentimeter polyp in the sigmoid colon. -Bone marrow biopsy on 08/21/2018 shows normocellular marrow with erythroid hyperplasia.  No increase in plasma cells. -Denies any bleeding per rectum or melena. -He is taking aspirin 81 mg daily and iron tablet daily.  According to the wife, he reportedly had a stroke and was placed on aspirin. -Last Feraheme was on 05/06/2019 and 05/13/2019. - I have reviewed his labs.  Ferritin is 41, up from 34, 3 months ago.  Hemoglobin is 10.5 with MCV of 86. - Based on the low ferritin, I have recommended 2 more infusions of Feraheme. - I will reevaluate him in 3 months with repeat labs.  We will also check W58 and folic acid levels at that time.

## 2019-07-27 ENCOUNTER — Inpatient Hospital Stay (HOSPITAL_COMMUNITY): Payer: PPO

## 2019-07-27 ENCOUNTER — Other Ambulatory Visit: Payer: Self-pay

## 2019-07-27 VITALS — BP 143/64 | HR 74 | Temp 97.3°F | Resp 18

## 2019-07-27 DIAGNOSIS — D509 Iron deficiency anemia, unspecified: Secondary | ICD-10-CM | POA: Diagnosis not present

## 2019-07-27 DIAGNOSIS — D500 Iron deficiency anemia secondary to blood loss (chronic): Secondary | ICD-10-CM

## 2019-07-27 MED ORDER — SODIUM CHLORIDE 0.9 % IV SOLN
INTRAVENOUS | Status: DC
  Administered 2019-07-27: 13:00:00 via INTRAVENOUS

## 2019-07-27 MED ORDER — SODIUM CHLORIDE 0.9 % IV SOLN
510.0000 mg | Freq: Once | INTRAVENOUS | Status: AC
  Administered 2019-07-27: 510 mg via INTRAVENOUS
  Filled 2019-07-27: qty 510

## 2019-07-27 NOTE — Progress Notes (Signed)
Jared Schmidt presents today for IV iron infusion. Infusion tolerated without incident or complaint. See MAR for details. VSS prior to and post infusion. Discharged in satisfactory condition with follow up instructions.

## 2019-07-27 NOTE — Patient Instructions (Signed)
[place] at [facility]  Discharge Instructions:  Today you received and iron infusion. Follow up as scheduled. _______________________________________________________________  Thank you for choosing [place] at [facility] to provide your oncology and hematology care.  To afford each patient quality time with our providers, please arrive at least 15 minutes before your scheduled appointment.  You need to re-schedule your appointment if you arrive 10 or more minutes late.  We strive to give you quality time with our providers, and arriving late affects you and other patients whose appointments are after yours.  Also, if you no show three or more times for appointments you may be dismissed from the clinic.  Again, thank you for choosing [place] at [place] hope is that these requests will allow you access to exceptional care and in a timely manner. _______________________________________________________________  If you have questions after your visit, please contact our office at [phone] between the hours of 8:30 a.m. and 5:00 p.m. Voicemails left after 4:30 p.m. will not be returned until the following business day. _______________________________________________________________  For prescription refill requests, have your pharmacy contact our office. _______________________________________________________________  Recommendations made by the consultant and any test results will be sent to your referring physician. _______________________________________________________________

## 2019-08-04 ENCOUNTER — Inpatient Hospital Stay (HOSPITAL_COMMUNITY): Payer: PPO

## 2019-08-04 ENCOUNTER — Encounter (HOSPITAL_COMMUNITY): Payer: Self-pay

## 2019-08-04 ENCOUNTER — Other Ambulatory Visit: Payer: Self-pay

## 2019-08-04 VITALS — BP 117/64 | HR 65 | Temp 97.1°F | Resp 18

## 2019-08-04 DIAGNOSIS — D509 Iron deficiency anemia, unspecified: Secondary | ICD-10-CM | POA: Diagnosis not present

## 2019-08-04 DIAGNOSIS — D500 Iron deficiency anemia secondary to blood loss (chronic): Secondary | ICD-10-CM

## 2019-08-04 MED ORDER — SODIUM CHLORIDE 0.9 % IV SOLN
510.0000 mg | Freq: Once | INTRAVENOUS | Status: AC
  Administered 2019-08-04: 510 mg via INTRAVENOUS
  Filled 2019-08-04: qty 17

## 2019-08-04 MED ORDER — SODIUM CHLORIDE 0.9 % IV SOLN
Freq: Once | INTRAVENOUS | Status: AC
  Administered 2019-08-04: 14:00:00 via INTRAVENOUS

## 2019-08-04 NOTE — Patient Instructions (Signed)
[facility] at [facility]  Discharge Instructions:   _______________________________________________________________  Thank you for choosing [facility] at [facility] to provide your oncology and hematology care.  To afford each patient quality time with our providers, please arrive at least 15 minutes before your scheduled appointment.  You need to re-schedule your appointment if you arrive 10 or more minutes late.  We strive to give you quality time with our providers, and arriving late affects you and other patients whose appointments are after yours.  Also, if you no show three or more times for appointments you may be dismissed from the clinic.  Again, thank you for choosing [facility] at [facility]. Our hope is that these requests will allow you access to exceptional care and in a timely manner. _______________________________________________________________  If you have questions after your visit, please contact our office at [phone] between the hours of 8:30 a.m. and 5:00 p.m. Voicemails left after 4:30 p.m. will not be returned until the following business day. _______________________________________________________________  For prescription refill requests, have your pharmacy contact our office. _______________________________________________________________  Recommendations made by the consultant and any test results will be sent to your referring physician. _______________________________________________________________ 

## 2019-08-04 NOTE — Progress Notes (Signed)
Treatment given today per MD orders. Tolerated infusion without adverse affects. Vital signs stable. No complaints at this time. Discharged from clinic ambulatory. F/U with [facility] as scheduled.      [place] at [facility]  Discharge Instructions:  _______________________________________________________________  Thank you for choosing [place] at [facility] to provide your oncology and hematology care.  To afford each patient quality time with our providers, please arrive at least 15 minutes before your scheduled appointment.  You need to re-schedule your appointment if you arrive 10 or more minutes late.  We strive to give you quality time with our providers, and arriving late affects you and other patients whose appointments are after yours.  Also, if you no show three or more times for appointments you may be dismissed from the clinic.  Again, thank you for choosing [place] [facility] [place] hope is that these requests will allow you access to exceptional care and in a timely manner. _______________________________________________________________  If you have questions after your visit, please contact our office at [phone] between the hours of 8:30 a.m. and 5:00 p.m. Voicemails left after 4:30 p.m. will not be returned until the following business day. _______________________________________________________________  For prescription refill requests, have your pharmacy contact our office. _______________________________________________________________  Recommendations made by the consultant and any test results will be sent to your referring physician. _______________________________________________________________

## 2019-09-08 ENCOUNTER — Ambulatory Visit (INDEPENDENT_AMBULATORY_CARE_PROVIDER_SITE_OTHER): Payer: PPO | Admitting: Urology

## 2019-09-08 DIAGNOSIS — R351 Nocturia: Secondary | ICD-10-CM | POA: Diagnosis not present

## 2019-09-08 DIAGNOSIS — N401 Enlarged prostate with lower urinary tract symptoms: Secondary | ICD-10-CM | POA: Diagnosis not present

## 2019-09-24 ENCOUNTER — Other Ambulatory Visit: Payer: Self-pay

## 2019-10-18 ENCOUNTER — Other Ambulatory Visit: Payer: Self-pay

## 2019-10-18 ENCOUNTER — Inpatient Hospital Stay (HOSPITAL_COMMUNITY): Payer: PPO | Attending: Hematology

## 2019-10-18 DIAGNOSIS — R5383 Other fatigue: Secondary | ICD-10-CM | POA: Diagnosis not present

## 2019-10-18 DIAGNOSIS — Z8719 Personal history of other diseases of the digestive system: Secondary | ICD-10-CM | POA: Diagnosis not present

## 2019-10-18 DIAGNOSIS — K5730 Diverticulosis of large intestine without perforation or abscess without bleeding: Secondary | ICD-10-CM | POA: Insufficient documentation

## 2019-10-18 DIAGNOSIS — D500 Iron deficiency anemia secondary to blood loss (chronic): Secondary | ICD-10-CM

## 2019-10-18 DIAGNOSIS — K922 Gastrointestinal hemorrhage, unspecified: Secondary | ICD-10-CM | POA: Insufficient documentation

## 2019-10-18 DIAGNOSIS — K219 Gastro-esophageal reflux disease without esophagitis: Secondary | ICD-10-CM | POA: Diagnosis not present

## 2019-10-18 DIAGNOSIS — Z86010 Personal history of colonic polyps: Secondary | ICD-10-CM | POA: Insufficient documentation

## 2019-10-18 DIAGNOSIS — Z7982 Long term (current) use of aspirin: Secondary | ICD-10-CM | POA: Diagnosis not present

## 2019-10-18 DIAGNOSIS — Z79899 Other long term (current) drug therapy: Secondary | ICD-10-CM | POA: Insufficient documentation

## 2019-10-18 DIAGNOSIS — Z87891 Personal history of nicotine dependence: Secondary | ICD-10-CM | POA: Insufficient documentation

## 2019-10-18 LAB — COMPREHENSIVE METABOLIC PANEL
ALT: 12 U/L (ref 0–44)
AST: 22 U/L (ref 15–41)
Albumin: 3.8 g/dL (ref 3.5–5.0)
Alkaline Phosphatase: 83 U/L (ref 38–126)
Anion gap: 9 (ref 5–15)
BUN: 17 mg/dL (ref 8–23)
CO2: 24 mmol/L (ref 22–32)
Calcium: 9.3 mg/dL (ref 8.9–10.3)
Chloride: 107 mmol/L (ref 98–111)
Creatinine, Ser: 1.3 mg/dL — ABNORMAL HIGH (ref 0.61–1.24)
GFR calc Af Amer: 59 mL/min — ABNORMAL LOW (ref >60)
GFR calc non Af Amer: 51 mL/min — ABNORMAL LOW (ref >60)
Glucose, Bld: 97 mg/dL (ref 70–99)
Potassium: 4.3 mmol/L (ref 3.5–5.1)
Sodium: 140 mmol/L (ref 135–145)
Total Bilirubin: 0.6 mg/dL (ref 0.3–1.2)
Total Protein: 7 g/dL (ref 6.5–8.1)

## 2019-10-18 LAB — CBC WITH DIFFERENTIAL/PLATELET
Abs Immature Granulocytes: 0.01 10*3/uL (ref 0.00–0.07)
Basophils Absolute: 0 10*3/uL (ref 0.0–0.1)
Basophils Relative: 1 %
Eosinophils Absolute: 0.2 10*3/uL (ref 0.0–0.5)
Eosinophils Relative: 3 %
HCT: 38.4 % — ABNORMAL LOW (ref 39.0–52.0)
Hemoglobin: 11.1 g/dL — ABNORMAL LOW (ref 13.0–17.0)
Immature Granulocytes: 0 %
Lymphocytes Relative: 12 %
Lymphs Abs: 0.6 10*3/uL — ABNORMAL LOW (ref 0.7–4.0)
MCH: 24.7 pg — ABNORMAL LOW (ref 26.0–34.0)
MCHC: 28.9 g/dL — ABNORMAL LOW (ref 30.0–36.0)
MCV: 85.5 fL (ref 80.0–100.0)
Monocytes Absolute: 0.6 10*3/uL (ref 0.1–1.0)
Monocytes Relative: 12 %
Neutro Abs: 3.8 10*3/uL (ref 1.7–7.7)
Neutrophils Relative %: 72 %
Platelets: 343 10*3/uL (ref 150–400)
RBC: 4.49 MIL/uL (ref 4.22–5.81)
RDW: 18.5 % — ABNORMAL HIGH (ref 11.5–15.5)
WBC: 5.2 10*3/uL (ref 4.0–10.5)
nRBC: 0 % (ref 0.0–0.2)

## 2019-10-18 LAB — FOLATE: Folate: 50.8 ng/mL (ref >5.9)

## 2019-10-18 LAB — VITAMIN B12: Vitamin B-12: 1390 pg/mL — ABNORMAL HIGH (ref 180–914)

## 2019-10-18 LAB — IRON AND TIBC
Iron: 23 ug/dL — ABNORMAL LOW (ref 45–182)
Saturation Ratios: 7 % — ABNORMAL LOW (ref 17.9–39.5)
TIBC: 337 ug/dL (ref 250–450)
UIBC: 314 ug/dL

## 2019-10-18 LAB — FERRITIN: Ferritin: 37 ng/mL (ref 24–336)

## 2019-10-25 ENCOUNTER — Encounter (HOSPITAL_COMMUNITY): Payer: Self-pay | Admitting: Nurse Practitioner

## 2019-10-25 ENCOUNTER — Other Ambulatory Visit: Payer: Self-pay

## 2019-10-25 ENCOUNTER — Inpatient Hospital Stay (HOSPITAL_BASED_OUTPATIENT_CLINIC_OR_DEPARTMENT_OTHER): Payer: PPO | Admitting: Nurse Practitioner

## 2019-10-25 DIAGNOSIS — D500 Iron deficiency anemia secondary to blood loss (chronic): Secondary | ICD-10-CM

## 2019-10-25 IMAGING — CT CT ABD-PELV W/ CM
4 of 9 series · 15 of 36 positions shown, 17 images · IV contrast (iopamidol)
Comparison: None.

CLINICAL DATA: Gross hematuria, neck pain.  Shortness of breath.

EXAM:
CT OF THE NECK WITH CONTRAST
CT OF THE CHEST WITH CONTRAST
CT OF THE ABDOMEN AND PELVIS WITH CONTRAST
TECHNIQUE: Multidetector CT imaging of the neck was performed with intravenous
contrast.; Multidetector CT imaging of the abdomen and pelvis was
performed following the standard protocol during bolus
administration of intravenous contrast.; Multidetector CT imaging of
the chest was performed following the standard protocol during bolus
administration of intravenous contrast.
CONTRAST:  <See Chart> IK2IOX-AEE IOPAMIDOL (IK2IOX-AEE) INJECTION
61%, 100mL IK2IOX-AEE IOPAMIDOL (IK2IOX-AEE) INJECTION 61%

[Series 3: cap with · axial · 0.66mm/px · z∈[+712,+1132]mm · 5 of 128 slices shown, 7 images]
[im 22/128  mediastinal]
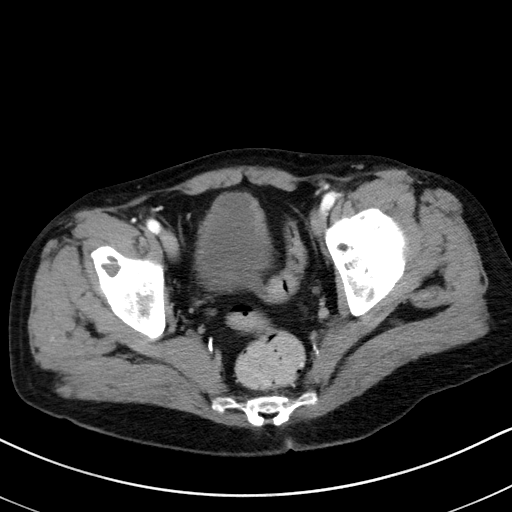
[im 22/128  lung]
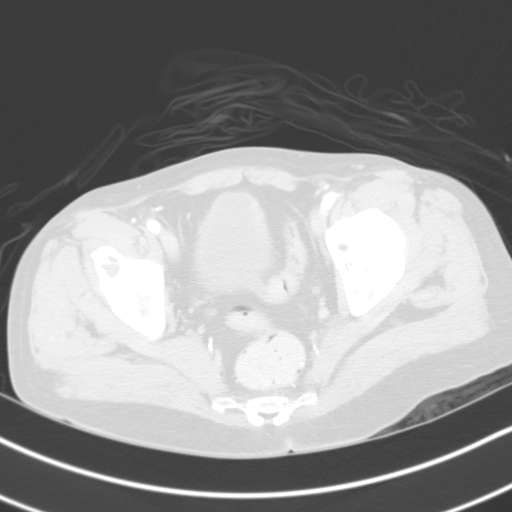
[im 43/128  lung]
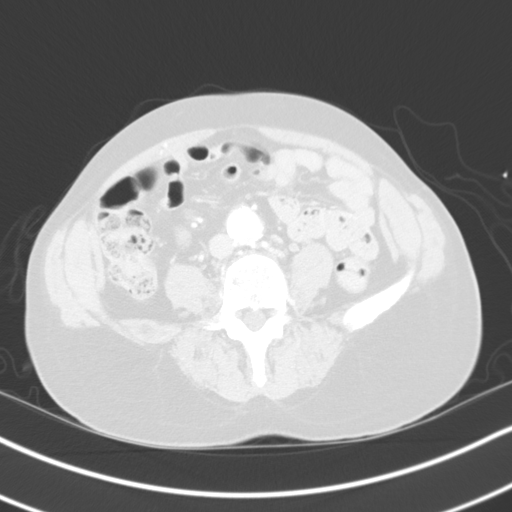
[im 64/128  lung]
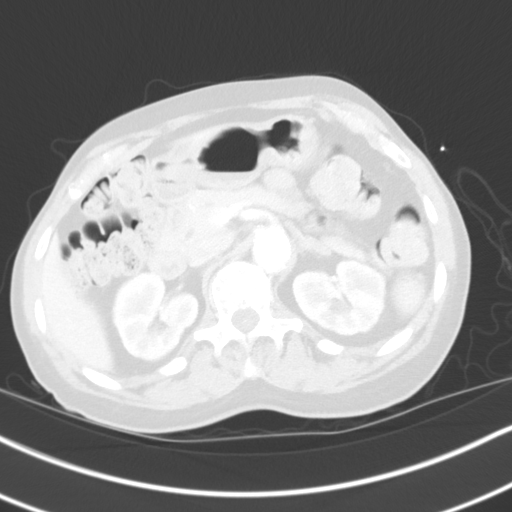
[im 85/128  lung]
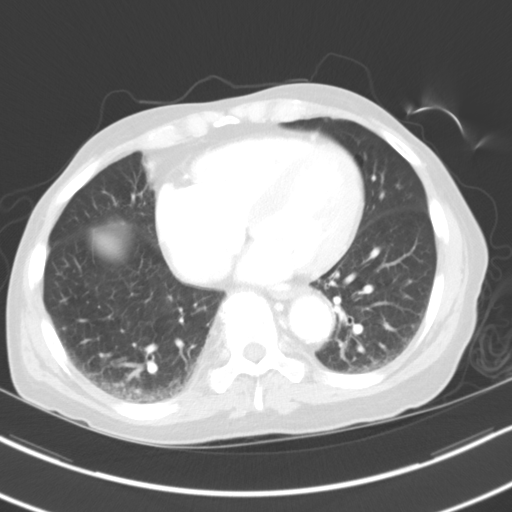
[im 106/128  mediastinal]
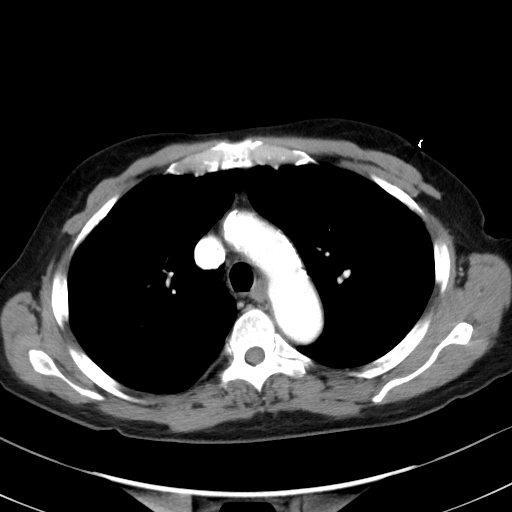
[im 106/128  lung]
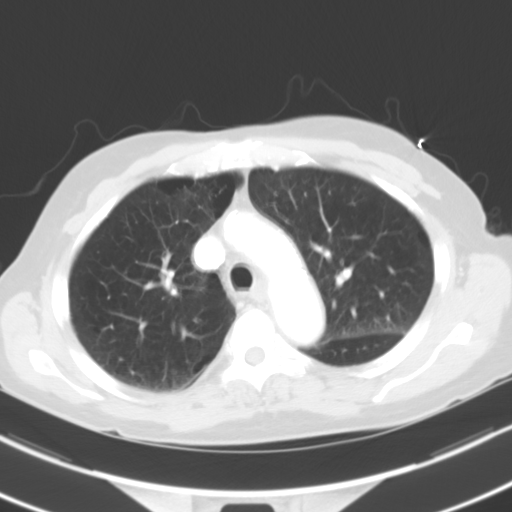

[Series 6: lung · axial · 0.69mm/px · z∈[+1002,+1186]mm · 5 of 139 slices shown]
[im 24/139  lung]
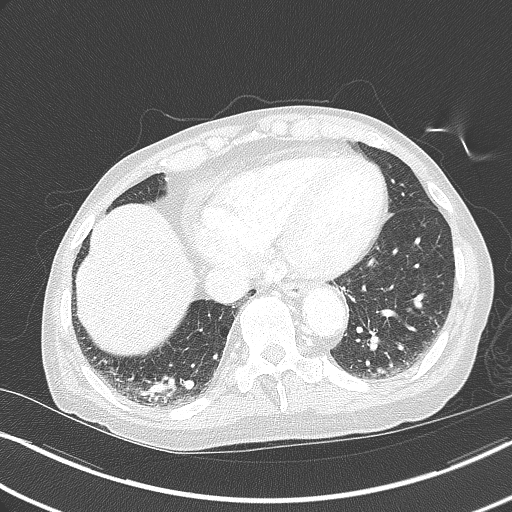
[im 47/139  lung]
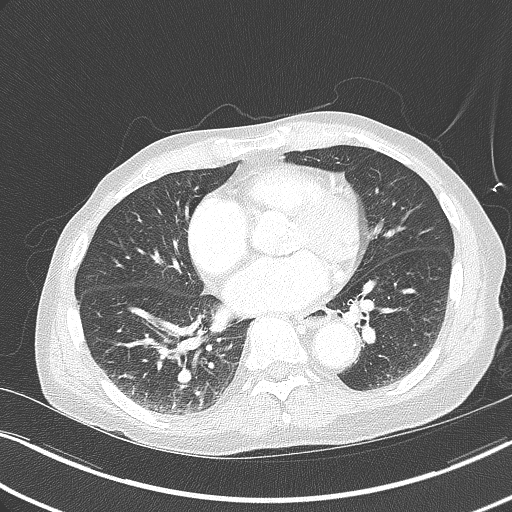
[im 70/139  lung]
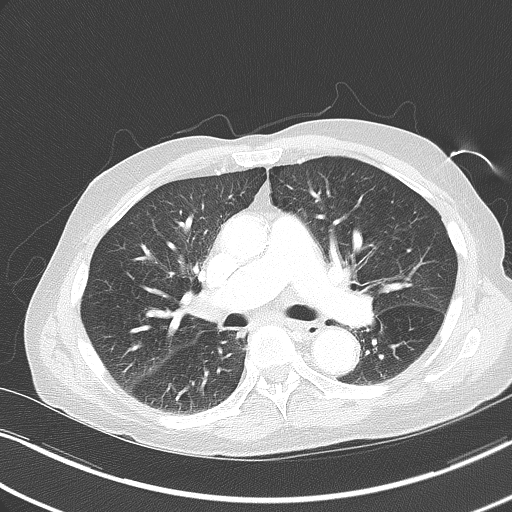
[im 93/139  lung]
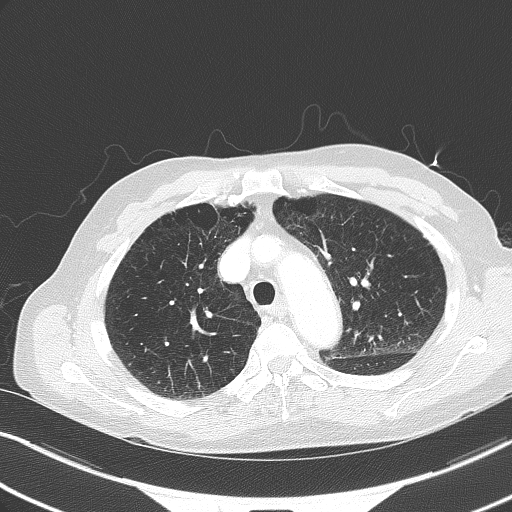
[im 116/139  lung]
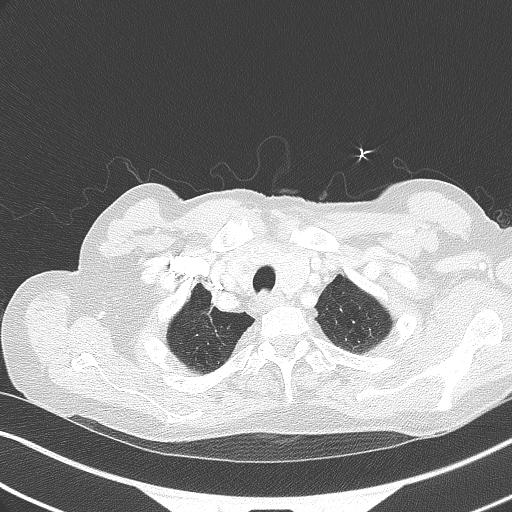

[Series 9: axial neck · axial · 0.49mm/px · z∈[+1166,+1312]mm · 4 of 123 slices shown]
[im 25/123  lung]
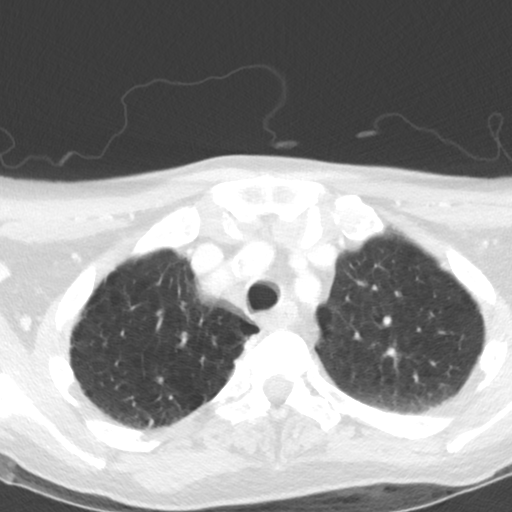
[im 49/123  lung]
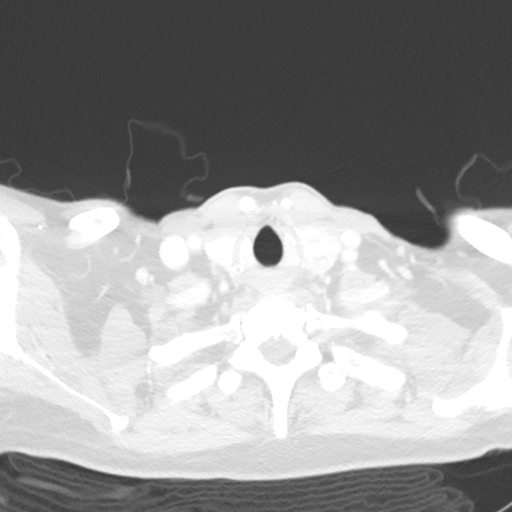
[im 74/123  lung]
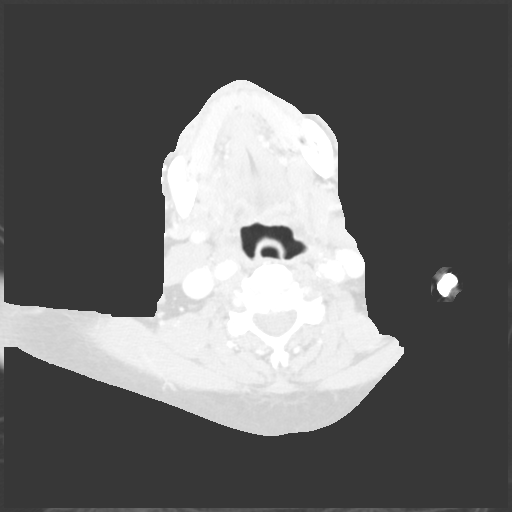
[im 98/123  lung]
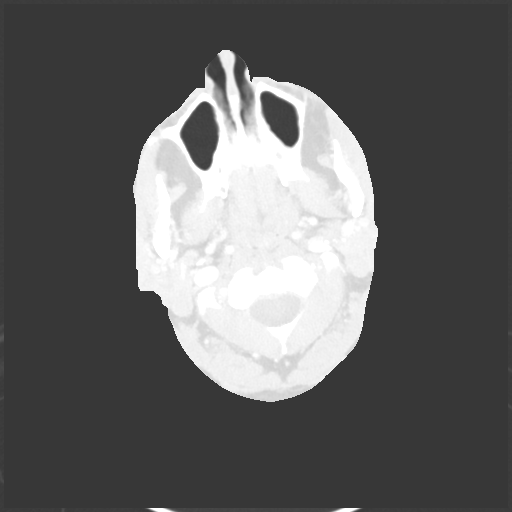

[Series 13: coronal neck · coronal · 0.40mm/px · 1 of 120 slices shown]
[im 60/120  lung]
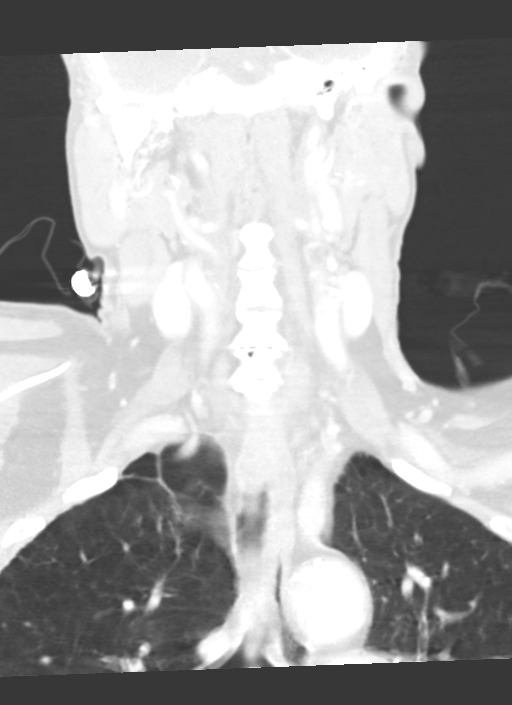

[15 of 36 positions shown; findings below may reference images not displayed]

FINDINGS: CT NECK FINDINGS

Pharynx and larynx: Normal. No mass or swelling.

Salivary glands: No inflammation, mass, or stone.

Thyroid: 11 mm left thyroid cyst is noted. Thyroid ultrasound is
recommended for further evaluation.

Lymph nodes: None enlarged or abnormal density.

Vascular: Atherosclerotic calcifications of carotid arteries are
noted without significant stenosis. No other significant vascular
abnormality is noted.

Limited intracranial: Negative.

Visualized orbits: Negative.

Mastoids and visualized paranasal sinuses: Clear.

Skeleton: No acute or aggressive process.

Upper chest: See below.

Other: None.

CT CHEST FINDINGS

Cardiovascular: Atherosclerosis of thoracic aorta is noted without
aneurysm or dissection. Normal cardiac size. No pericardial effusion
is noted. Mild coronary artery calcifications are noted.

Mediastinum/Nodes: Esophagus is unremarkable. No mediastinal
adenopathy is noted.

Lungs/Pleura: No pneumothorax or pleural effusion is noted. Mild
emphysematous disease is noted in both upper lobes. No consolidative
process is noted.

Musculoskeletal: No chest wall mass or suspicious bone lesions
identified.

CT ABDOMEN AND PELVIS FINDINGS

Hepatobiliary: No focal liver abnormality is seen. No gallstones,
gallbladder wall thickening, or biliary dilatation.

Pancreas: Unremarkable. No pancreatic ductal dilatation or
surrounding inflammatory changes.

Spleen: Normal in size without focal abnormality.

Adrenals/Urinary Tract: Adrenal glands are unremarkable. Kidneys are
normal, without renal calculi, focal lesion, or hydronephrosis.
Bladder is unremarkable.

Stomach/Bowel: Stomach is within normal limits. Appendix appears
normal. No evidence of bowel wall thickening, distention, or
inflammatory changes. Stool is noted throughout the colon.

Vascular/Lymphatic: 3.4 cm infrarenal abdominal aortic aneurysm is
noted. Atherosclerosis of abdominal aorta is noted without
dissection. No significant adenopathy is noted.

Reproductive: Prostate is unremarkable.

Other: No abdominal wall hernia or abnormality. No abdominopelvic
ascites.

Musculoskeletal: Multilevel degenerative disc disease is noted in
lower lumbar spine. No acute osseous abnormality is noted.
IMPRESSION: 11 mm left thyroid nodule is noted. Thyroid ultrasound is
recommended for further evaluation.

No other significant abnormality seen in the soft tissues of the
neck.

No acute abnormality is noted in the chest.

3.4 cm infrarenal abdominal aortic aneurysm is noted. Recommend
followup by ultrasound in 3 years. This recommendation follows ACR
consensus guidelines: White Paper of the ACR Incidental Findings
Committee II on Vascular Findings. [HOSPITAL] 2848;
[DATE].

Multilevel degenerative disc disease is noted in lower lumbar spine.

Aortic Atherosclerosis (3KLTL-HXD.D) and Emphysema (3KLTL-YOA.S).

## 2019-10-25 NOTE — Progress Notes (Signed)
Jared Schmidt, Jared Schmidt [id number]   CLINIC:  Medical Oncology/Hematology  PCP:  Sinda Du, MD [address] [place] [address] [phone]   REASON FOR VISIT: Follow-up for iron deficiency anemia  CURRENT THERAPY: Intermittent iron infusions   INTERVAL HISTORY:  Jared Schmidt 83 y.o. male returns for routine follow-up for iron deficiency anemia.  Patient denies any bright red bleeding per rectum or melena.  Patient denies any easy bruising or bleeding. Denies any nausea, vomiting, or diarrhea. Denies any new pains. Had not noticed any recent bleeding such as epistaxis, hematuria or hematochezia. Denies recent chest pain on exertion, shortness of breath on minimal exertion, pre-syncopal episodes, or palpitations. Denies any numbness or tingling in hands or feet. Denies any recent fevers, infections, or recent hospitalizations. Patient reports appetite at 100% and energy level at 50%.  Patient is eating well maintain his weight this time.    REVIEW OF SYSTEMS:  Review of Systems  All other systems reviewed and are negative.    PAST MEDICAL/SURGICAL HISTORY:  Past Medical History:  Diagnosis Date  . Anemia   . GERD (gastroesophageal reflux disease)   . Hypercholesteremia   . Hypertension   . Iron deficiency anemia due to chronic blood loss 03/17/2018  . Vitamin B deficiency    patient denies   Past Surgical History:  Procedure Laterality Date  . BIOPSY  06/24/2018   Procedure: BIOPSY;  Surgeon: Daneil Dolin, MD;  [facility];  Service: Endoscopy;;  gastric   . COLONOSCOPY N/A 06/24/2018   Dr. Gala Romney: Internal hemorrhoids, diverticulosis, 9 mm polyp removed from the sigmoid colon which was hyperplastic.  Marland Kitchen ESOPHAGOGASTRODUODENOSCOPY N/A 06/24/2018   Dr. Gala Romney: Multiple erosions in the stomach and duodenal bulb, gastric biopsies with chronic gastritis with intestinal metaplasia, no H. pylori.  .  ESOPHAGOGASTRODUODENOSCOPY (EGD) WITH PROPOFOL N/A 09/17/2018   Procedure: ESOPHAGOGASTRODUODENOSCOPY (EGD) WITH PROPOFOL;  Surgeon: Daneil Dolin, MD;  [facility]: [facility];  Service: Endoscopy;  Laterality: N/A;  11:00am  . GIVENS CAPSULE STUDY N/A 09/15/2018   Procedure: GIVENS CAPSULE STUDY;  Surgeon: Daneil Dolin, MD;  [facility]: [facility];  Service: Endoscopy;  Laterality: N/A;  7:30am  . none    . POLYPECTOMY  06/24/2018   Procedure: POLYPECTOMY;  Surgeon: Daneil Dolin, MD;  Location: [facility];  Service: Endoscopy;;  sigmoid polyp hs     SOCIAL HISTORY:  Social History   Socioeconomic History  . Marital status: Married    Spouse name: Jakevion Arney  . Number of children: Not on file  . Years of education: Not on file  . Highest education level: 8th grade  Occupational History  . Not on file  Tobacco Use  . Smoking status: Former Smoker    Packs/day: 0.50    Years: 67.00    Pack years: 33.50    Types: Cigarettes    Quit date: 12/11/2017    Years since quitting: 1.8  . Smokeless tobacco: Never Used  Substance and Sexual Activity  . Alcohol use: No  . Drug use: No  . Sexual activity: Not Currently  Other Topics Concern  . Not on file  Social History Narrative  . Not on file   Social Determinants of Health   Financial Resource Strain:   . Difficulty of Paying Living Expenses: Not on file  Food Insecurity:   . Worried About Charity fundraiser in the Last Year: Not on file  .  Ran Out of Food in the Last Year: Not on file  Transportation Needs:   . Lack of Transportation (Medical): Not on file  . Lack of Transportation (Non-Medical): Not on file  Physical Activity:   . Days of Exercise per Week: Not on file  . Minutes of Exercise per Session: Not on file  Stress:   . Feeling of Stress : Not on file  Social Connections:   . Frequency of Communication with Friends and Family: Not on file  . Frequency of Social Gatherings with Friends and Family:  Not on file  . Attends Religious Services: Not on file  . Active Member of Clubs or Organizations: Not on file  . Attends Archivist Meetings: Not on file  . Marital Status: Not on file  Intimate Partner Violence:   . Fear of Current or Ex-Partner: Not on file  . Emotionally Abused: Not on file  . Physically Abused: Not on file  . Sexually Abused: Not on file    FAMILY HISTORY:  Family History  Problem Relation Age of Onset  . Colon cancer Neg Hx     CURRENT MEDICATIONS:  Outpatient Encounter Medications as of 10/25/2019  Medication Sig  . alfuzosin (UROXATRAL) 10 MG 24 hr tablet Take 10 mg by mouth at bedtime.  Marland Kitchen amLODipine (NORVASC) 5 MG tablet Take 5 mg by mouth daily.  Marland Kitchen aspirin EC 81 MG tablet Take 81 mg by mouth daily.  Marland Kitchen atorvastatin (LIPITOR) 10 MG tablet Take 10 mg by mouth daily with supper.   . carvedilol (COREG) 12.5 MG tablet Take 12.5 mg by mouth 2 (two) times daily with a meal.  . clotrimazole-betamethasone (LOTRISONE) cream Apply 1 application topically 2 (two) times daily.  . Ferrous Sulfate (IRON) 325 (65 Fe) MG TABS Take 1 tablet by mouth daily.  . Multiple Vitamins-Minerals (CENTRUM SILVER PO) Take 1 tablet by mouth daily.  . pantoprazole (PROTONIX) 40 MG tablet Take 40 mg by mouth daily.   . vitamin C (ASCORBIC ACID) 500 MG tablet Take 500 mg by mouth daily.  . vitamin E 100 UNIT capsule Take 100 Units by mouth daily.    No facility-administered encounter medications on file as of 10/25/2019.    ALLERGIES:  No Known Allergies   PHYSICAL EXAM:  ECOG Performance status: 1  Vitals:   10/25/19 1300  BP: (!) 150/68  Pulse: 80  Resp: 16  Temp: 97.9 F (36.6 C)  SpO2: 97%   Filed Weights   10/25/19 1300  Weight: 147 lb 4 oz (66.8 kg)    Physical Exam Constitutional:      Appearance: Normal appearance. He is normal weight.  Cardiovascular:     Rate and Rhythm: Normal rate and regular rhythm.     Heart sounds: Normal heart sounds.    Pulmonary:     Effort: Pulmonary effort is normal.     Breath sounds: Normal breath sounds.  Abdominal:     General: Bowel sounds are normal.     Palpations: Abdomen is soft.  Musculoskeletal:        General: Normal range of motion.  Skin:    General: Skin is warm.  Neurological:     Mental Status: He is alert and oriented to person, place, and time. Mental status is at baseline.  Psychiatric:        Mood and Affect: Mood normal.        Behavior: Behavior normal.        Thought Content: Thought  content normal.        Judgment: Judgment normal.      LABORATORY DATA:  I have reviewed the labs as listed.  CBC    Component Value Date/Time   WBC 5.2 10/18/2019 1305   RBC 4.49 10/18/2019 1305   HGB 11.1 (L) 10/18/2019 1305   HCT 38.4 (L) 10/18/2019 1305   PLT 343 10/18/2019 1305   MCV 85.5 10/18/2019 1305   MCH 24.7 (L) 10/18/2019 1305   MCHC 28.9 (L) 10/18/2019 1305   RDW 18.5 (H) 10/18/2019 1305   LYMPHSABS 0.6 (L) 10/18/2019 1305   MONOABS 0.6 10/18/2019 1305   EOSABS 0.2 10/18/2019 1305   BASOSABS 0.0 10/18/2019 1305   CMP Latest Ref Rng & Units 10/18/2019 07/20/2019 03/19/2019  Glucose 70 - 99 mg/dL 97 120(H) 89  BUN 8 - 23 mg/dL '17 14 19  ' Creatinine 0.61 - 1.24 mg/dL 1.30(H) 1.03 1.06  Sodium 135 - 145 mmol/L 140 140 140  Potassium 3.5 - 5.1 mmol/L 4.3 4.1 4.1  Chloride 98 - 111 mmol/L 107 109 108  CO2 22 - 32 mmol/L '24 23 24  ' Calcium 8.9 - 10.3 mg/dL 9.3 9.2 9.2  Total Protein 6.5 - 8.1 g/dL 7.0 6.7 6.9  Total Bilirubin 0.3 - 1.2 mg/dL 0.6 0.5 0.4  Alkaline Phos 38 - 126 U/L 83 91 79  AST 15 - 41 U/L '22 22 22  ' ALT 0 - 44 U/L '12 14 14     ' I personally performed a face-to-face visit.  All questions were answered to patient's stated satisfaction. Encouraged patient to call with any new concerns or questions before his next visit to the cancer center and we can certain see him sooner, if needed.     ASSESSMENT & PLAN:   Iron deficiency anemia due to chronic  blood loss 1.  Iron deficiency anemia: -Likely from combination of chronic GI blood loss and malabsorption. 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  Normal duodenal bulb and second part of duodenum. -Colonoscopy on 06/24/2018 showed internal hemorrhoids, few medium mouth diverticula in the entire colon.  Subcentimeter polyp in the sigmoid colon. -Bone marrow biopsy on 08/21/2018 showed normocellular marrow with erythroid hyperplasia.  No increase in plasma cells. -Denies any bright red bleeding per rectum or melena. -He is taking aspirin 81 mg daily and iron tablet daily.  According to the wife he reportedly had a stroke and was placed on aspirin. -Last Feraheme was on 07/27/2019 and 08/04/2019. -Labs done on 10/18/2019 showed hemoglobin 11.1, WBC 5.2, platelets 343, ferritin 37, percent saturation 7, vitamin B12 1390. -We will give him 2 more iron infusions due to him still having some fatigue. -We will see him back in 4 months with repeat labs.      Orders placed this encounter:  Orders Placed This Encounter  Procedures  . Lactate dehydrogenase  . CBC with Differential/Platelet  . Comprehensive metabolic panel  . Ferritin  . Iron and TIBC  . Vitamin B12  . Vitamin D 25 hydroxy  . Folate      Francene Finders, FNP-C [facility] [phone]

## 2019-10-25 NOTE — Assessment & Plan Note (Signed)
1.  Iron deficiency anemia: -Likely from combination of chronic GI blood loss and malabsorption. 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  Normal duodenal bulb and second part of duodenum. -Colonoscopy on 06/24/2018 showed internal hemorrhoids, few medium mouth diverticula in the entire colon.  Subcentimeter polyp in the sigmoid colon. -Bone marrow biopsy on 08/21/2018 showed normocellular marrow with erythroid hyperplasia.  No increase in plasma cells. -Denies any bright red bleeding per rectum or melena. -He is taking aspirin 81 mg daily and iron tablet daily.  According to the wife he reportedly had a stroke and was placed on aspirin. -Last Feraheme was on 07/27/2019 and 08/04/2019. -Labs done on 10/18/2019 showed hemoglobin 11.1, WBC 5.2, platelets 343, ferritin 37, percent saturation 7, vitamin B12 1390. -We will give him 2 more iron infusions due to him still having some fatigue. -We will see him back in 4 months with repeat labs.

## 2019-11-01 ENCOUNTER — Encounter (HOSPITAL_COMMUNITY): Payer: Self-pay

## 2019-11-01 ENCOUNTER — Inpatient Hospital Stay (HOSPITAL_COMMUNITY): Payer: PPO

## 2019-11-01 ENCOUNTER — Other Ambulatory Visit: Payer: Self-pay

## 2019-11-01 VITALS — BP 140/72 | HR 65 | Temp 97.5°F | Resp 17

## 2019-11-01 DIAGNOSIS — D500 Iron deficiency anemia secondary to blood loss (chronic): Secondary | ICD-10-CM | POA: Diagnosis not present

## 2019-11-01 MED ORDER — SODIUM CHLORIDE 0.9 % IV SOLN
510.0000 mg | Freq: Once | INTRAVENOUS | Status: AC
  Administered 2019-11-01: 15:00:00 510 mg via INTRAVENOUS
  Filled 2019-11-01: qty 510

## 2019-11-01 MED ORDER — SODIUM CHLORIDE 0.9 % IV SOLN: INTRAVENOUS | Status: DC

## 2019-11-01 NOTE — Progress Notes (Signed)
Iron infusion given per orders. Patient tolerated it well without problems. Vitals stable and discharged home from clinic ambulatory. Follow up as scheduled.  

## 2019-11-02 MED ORDER — OCTREOTIDE ACETATE 30 MG IM KIT
PACK | INTRAMUSCULAR | Status: AC
  Filled 2019-11-02: qty 1

## 2019-11-08 ENCOUNTER — Inpatient Hospital Stay (HOSPITAL_COMMUNITY): Payer: PPO | Attending: Hematology

## 2019-11-08 ENCOUNTER — Other Ambulatory Visit: Payer: Self-pay

## 2019-11-08 ENCOUNTER — Encounter (HOSPITAL_COMMUNITY): Payer: Self-pay

## 2019-11-08 VITALS — BP 132/75 | HR 65 | Temp 96.9°F | Resp 18

## 2019-11-08 DIAGNOSIS — D509 Iron deficiency anemia, unspecified: Secondary | ICD-10-CM | POA: Insufficient documentation

## 2019-11-08 DIAGNOSIS — D500 Iron deficiency anemia secondary to blood loss (chronic): Secondary | ICD-10-CM

## 2019-11-08 MED ORDER — SODIUM CHLORIDE 0.9 % IV SOLN
510.0000 mg | Freq: Once | INTRAVENOUS | Status: AC
  Administered 2019-11-08: 14:00:00 510 mg via INTRAVENOUS
  Filled 2019-11-08: qty 510

## 2019-11-08 MED ORDER — SODIUM CHLORIDE 0.9 % IV SOLN: INTRAVENOUS | Status: DC

## 2019-11-08 NOTE — Patient Instructions (Signed)
[facility] at [facility]  Discharge Instructions:   _______________________________________________________________  Thank you for choosing [facility] at [facility] to provide your oncology and hematology care.  To afford each patient quality time with our providers, please arrive at least 15 minutes before your scheduled appointment.  You need to re-schedule your appointment if you arrive 10 or more minutes late.  We strive to give you quality time with our providers, and arriving late affects you and other patients whose appointments are after yours.  Also, if you no show three or more times for appointments you may be dismissed from the clinic.  Again, thank you for choosing [facility] at [facility]. Our hope is that these requests will allow you access to exceptional care and in a timely manner. _______________________________________________________________  If you have questions after your visit, please contact our office at [phone] between the hours of 8:30 a.m. and 5:00 p.m. Voicemails left after 4:30 p.m. will not be returned until the following business day. _______________________________________________________________  For prescription refill requests, have your pharmacy contact our office. _______________________________________________________________  Recommendations made by the consultant and any test results will be sent to your referring physician. _______________________________________________________________ 

## 2019-11-08 NOTE — Progress Notes (Signed)
Patient presents today for Feraheme infusion. Vital signs stable. MAR reviewed. Patient has no complaints of any changes since the last visit.    Feraheme given today per MD orders. Tolerated infusion without adverse affects. Vital signs stable. No complaints at this time. Discharged from clinic ambulatory. F/U with [facility] as scheduled.

## 2019-11-25 ENCOUNTER — Other Ambulatory Visit: Payer: Self-pay | Admitting: Family Medicine

## 2019-12-16 ENCOUNTER — Other Ambulatory Visit: Payer: Self-pay

## 2019-12-16 ENCOUNTER — Ambulatory Visit (INDEPENDENT_AMBULATORY_CARE_PROVIDER_SITE_OTHER): Payer: PPO | Admitting: Family Medicine

## 2019-12-16 ENCOUNTER — Telehealth: Payer: Self-pay | Admitting: Family Medicine

## 2019-12-16 ENCOUNTER — Encounter: Payer: Self-pay | Admitting: Family Medicine

## 2019-12-16 VITALS — BP 120/76 | HR 84 | Temp 98.2°F | Ht 67.0 in | Wt 149.6 lb

## 2019-12-16 DIAGNOSIS — D500 Iron deficiency anemia secondary to blood loss (chronic): Secondary | ICD-10-CM | POA: Diagnosis not present

## 2019-12-16 DIAGNOSIS — E7800 Pure hypercholesterolemia, unspecified: Secondary | ICD-10-CM | POA: Diagnosis not present

## 2019-12-16 DIAGNOSIS — K219 Gastro-esophageal reflux disease without esophagitis: Secondary | ICD-10-CM

## 2019-12-16 DIAGNOSIS — I10 Essential (primary) hypertension: Secondary | ICD-10-CM

## 2019-12-16 MED ORDER — IRON 325 (65 FE) MG PO TABS: 1.0000 | ORAL_TABLET | Freq: Every day | ORAL | 5 refills | Status: DC

## 2019-12-16 NOTE — Patient Instructions (Addendum)
COVID-19 Vaccine Information can be found at: [email or web address].uk For questions related to vaccine distribution or appointments, please email [email or web address] or call [phone].   Fating labwork

## 2019-12-16 NOTE — Telephone Encounter (Signed)
Done

## 2019-12-16 NOTE — Progress Notes (Signed)
New Patient Office Visit  Subjective:  Patient ID: Jared Schmidt, male    DOB: Mar 03, 1936  Age: 84 y.o. MRN: [id number]  CC:  Chief Complaint  Patient presents with  . Establish Care  . covid vaccine questions  . Medication Refill    iron pill    HPI Jared Schmidt presents for iron deficiency anemia-varices noted-pt requires iron infusions-hematology-chronic GI blood loss/malabsorption-egd/colonoscopy 11/19 GERD-pantoprazole daily-stable HTN-carvedilol/amlodipine-blood pressure stable-h/o CVA BPH-uroxatral Hyperlipidemia-lipitor-needs lipid panel Past Medical History:  Diagnosis Date  . Anemia   . Arthritis   . GERD (gastroesophageal reflux disease)   . Hypercholesteremia   . Hypertension   . Iron deficiency anemia due to chronic blood loss 03/17/2018  . Stroke (HCC)   . Vitamin B deficiency    patient denies    Past Surgical History:  Procedure Laterality Date  . BIOPSY  06/24/2018   Procedure: BIOPSY;  Surgeon: Corbin Ade, MD;  [facility];  Service: Endoscopy;;  gastric   . COLONOSCOPY N/A 06/24/2018   Dr. Jena Gauss: Internal hemorrhoids, diverticulosis, 9 mm polyp removed from the sigmoid colon which was hyperplastic.  Marland Kitchen ESOPHAGOGASTRODUODENOSCOPY N/A 06/24/2018   Dr. Jena Gauss: Multiple erosions in the stomach and duodenal bulb, gastric biopsies with chronic gastritis with intestinal metaplasia, no H. pylori.  . ESOPHAGOGASTRODUODENOSCOPY (EGD) WITH PROPOFOL N/A 09/17/2018   Procedure: ESOPHAGOGASTRODUODENOSCOPY (EGD) WITH PROPOFOL;  Surgeon: Corbin Ade, MD;  [facility];  Service: Endoscopy;  Laterality: N/A;  11:00am  . GIVENS CAPSULE STUDY N/A 09/15/2018   Procedure: GIVENS CAPSULE STUDY;  Surgeon: Corbin Ade, MD;  [facility];  Service: Endoscopy;  Laterality: N/A;  7:30am  . none    . POLYPECTOMY  06/24/2018   Procedure: POLYPECTOMY;  Surgeon: Corbin Ade, MD;  [facility];  Service: Endoscopy;;   sigmoid polyp hs    Family History  Problem Relation Age of Onset  . Colon cancer Neg Hx     Social History   Socioeconomic History  . Marital status: Married    Spouse name: Aria Hannan  . Number of children: Not on file  . Years of education: Not on file  . Highest education level: 8th grade  Occupational History  . Occupation: retired  Tobacco Use  . Smoking status: Former Smoker    Packs/day: 0.50    Years: 67.00    Pack years: 33.50    Types: Cigarettes    Quit date: 12/11/2017    Years since quitting: 2.0  . Smokeless tobacco: Never Used  Substance and Sexual Activity  . Alcohol use: No  . Drug use: No  . Sexual activity: Not Currently  Other Topics Concern  . Not on file  Social History Narrative  . Not on file   Social Determinants of Health   Financial Resource Strain:   . Difficulty of Paying Living Expenses: Not on file  Food Insecurity:   . Worried About Programme researcher, broadcasting/film/video in the Last Year: Not on file  . Ran Out of Food in the Last Year: Not on file  Transportation Needs:   . Lack of Transportation (Medical): Not on file  . Lack of Transportation (Non-Medical): Not on file  Physical Activity:   . Days of Exercise per Week: Not on file  . Minutes of Exercise per Session: Not on file  Stress:   . Feeling of Stress : Not on file  Social Connections:   . Frequency of  Communication with Friends and Family: Not on file  . Frequency of Social Gatherings with Friends and Family: Not on file  . Attends Religious Services: Not on file  . Active Member of Clubs or Organizations: Not on file  . Attends Banker Meetings: Not on file  . Marital Status: Not on file  Intimate Partner Violence:   . Fear of Current or Ex-Partner: Not on file  . Emotionally Abused: Not on file  . Physically Abused: Not on file  . Sexually Abused: Not on file    ROS Review of Systems  Constitutional: Positive for fatigue.  HENT: Negative.   Eyes: Negative.         Sees eye specialist-dry eyes Cataracts removed  Respiratory: Positive for shortness of breath.        Asthma/COPD  Gastrointestinal:       GERD  Endocrine: Negative.        DM  Genitourinary: Negative.   Musculoskeletal: Positive for arthralgias.  Skin: Negative.   Allergic/Immunologic: Negative for environmental allergies.  Neurological: Negative for headaches.       CVA Saw neurology-concern for headaches  Hematological:       Anemia  Psychiatric/Behavioral: Negative.     Objective:   Today's Vitals: BP 120/76 (BP Location: Right Arm, Patient Position: Sitting, Cuff Size: Normal)   Pulse 84   Temp 98.2 F (36.8 C) (Oral)   Ht 5\' 7"  (1.702 m)   Wt 149 lb 9.6 oz (67.9 kg)   SpO2 96%   BMI 23.43 kg/m   Physical Exam Constitutional:      Appearance: Normal appearance.  Cardiovascular:     Rate and Rhythm: Normal rate.     Pulses: Normal pulses.     Heart sounds: Normal heart sounds.  Musculoskeletal:        General: Normal range of motion.     Cervical back: Normal range of motion and neck supple.  Neurological:     Mental Status: He is alert and oriented to person, place, and time.  Psychiatric:        Mood and Affect: Mood normal.        Behavior: Behavior normal.     Assessment & Plan:   1. Hypercholesteremia Lipid panel -have drawn with other labwork-fasting  2. Essential hypertension Stable on current medication-labwork reviewed 3. Iron deficiency anemia due to chronic blood loss Oncology following 4. Gastroesophageal reflux disease without esophagitis protonix -stable Outpatient Encounter Medications as of 12/16/2019  Medication Sig  . alfuzosin (UROXATRAL) 10 MG 24 hr tablet Take 10 mg by mouth at bedtime.  Marland Kitchen amLODipine (NORVASC) 5 MG tablet Take 5 mg by mouth daily.  Marland Kitchen aspirin EC 81 MG tablet Take 81 mg by mouth daily.  Marland Kitchen atorvastatin (LIPITOR) 10 MG tablet Take 10 mg by mouth daily with supper.   . carvedilol (COREG) 12.5 MG tablet Take  12.5 mg by mouth 2 (two) times daily with a meal.  . cholecalciferol (VITAMIN D3) 25 MCG (1000 UNIT) tablet Take 1,000 Units by mouth daily.  . clotrimazole-betamethasone (LOTRISONE) cream Apply 1 application topically 2 (two) times daily.  . Ferrous Sulfate (IRON) 325 (65 Fe) MG TABS Take 1 tablet by mouth daily.  . Multiple Vitamins-Minerals (CENTRUM SILVER PO) Take 1 tablet by mouth daily.  . pantoprazole (PROTONIX) 40 MG tablet Take 40 mg by mouth daily.   . vitamin C (ASCORBIC ACID) 500 MG tablet Take 500 mg by mouth daily.  . vitamin E 100  UNIT capsule Take 100 Units by mouth daily.    No facility-administered encounter medications on file as of 12/16/2019.    Follow-up: 6months Adeeb Konecny Mat Carne, MD

## 2019-12-16 NOTE — Telephone Encounter (Signed)
Patient was seen in the office today and needed a refill on the following medication but it was not sent into the pharmacy.  Ferrous Sulfate (IRON) 325 (65 Fe) MG TABS    [facility] [address], [place] - [place] Phone:  [phone]  Fax:  [phone]

## 2019-12-29 ENCOUNTER — Other Ambulatory Visit: Payer: Self-pay | Admitting: Emergency Medicine

## 2019-12-29 ENCOUNTER — Telehealth: Payer: Self-pay | Admitting: Family Medicine

## 2019-12-29 DIAGNOSIS — K219 Gastro-esophageal reflux disease without esophagitis: Secondary | ICD-10-CM

## 2019-12-29 MED ORDER — PANTOPRAZOLE SODIUM 40 MG PO TBEC: 40.0000 mg | DELAYED_RELEASE_TABLET | Freq: Every day | ORAL | 0 refills | Status: DC

## 2019-12-29 NOTE — Telephone Encounter (Signed)
Rx for pantoprazole has been sent to the pharmacy

## 2019-12-29 NOTE — Telephone Encounter (Signed)
Patient is calling and requesting a refill on the following medication.  pantoprazole (PROTONIX) 40 MG tablet   [facility] [address], [place] Phone:  [phone]  Fax:  [phone]

## 2019-12-31 ENCOUNTER — Ambulatory Visit: Payer: PPO

## 2020-01-09 ENCOUNTER — Ambulatory Visit: Payer: PPO | Attending: Internal Medicine

## 2020-01-09 DIAGNOSIS — Z23 Encounter for immunization: Secondary | ICD-10-CM | POA: Insufficient documentation

## 2020-01-09 NOTE — Progress Notes (Signed)
   Covid-19 Vaccination Clinic  Name:  Jared Schmidt    MRN: [id number] DOB: 1936/10/17  01/09/2020  [name] was observed post Covid-19 immunization for 15 minutes without incident. He was provided with Vaccine Information Sheet and instruction to access the V-Safe system.   [name] was instructed to call 911 with any severe reactions post vaccine: Marland Kitchen Difficulty breathing  . Swelling of face and throat  . A fast heartbeat  . A bad rash all over body  . Dizziness and weakness   Immunizations Administered    Name Date Dose VIS Date Route   Pfizer COVID-19 Vaccine 01/09/2020 12:54 PM 0.3 mL 10/15/2019 Intramuscular   Manufacturer: [place]   Lot: [id number]   [place]: [id number]

## 2020-01-20 ENCOUNTER — Encounter: Payer: Self-pay | Admitting: Family Medicine

## 2020-01-20 ENCOUNTER — Other Ambulatory Visit: Payer: Self-pay

## 2020-01-20 ENCOUNTER — Ambulatory Visit (INDEPENDENT_AMBULATORY_CARE_PROVIDER_SITE_OTHER): Payer: PPO | Admitting: Family Medicine

## 2020-01-20 VITALS — BP 130/78 | HR 72 | Temp 97.0°F | Ht 67.0 in | Wt 149.0 lb

## 2020-01-20 DIAGNOSIS — D500 Iron deficiency anemia secondary to blood loss (chronic): Secondary | ICD-10-CM | POA: Diagnosis not present

## 2020-01-20 DIAGNOSIS — K219 Gastro-esophageal reflux disease without esophagitis: Secondary | ICD-10-CM

## 2020-01-20 DIAGNOSIS — I10 Essential (primary) hypertension: Secondary | ICD-10-CM

## 2020-01-20 DIAGNOSIS — E7800 Pure hypercholesterolemia, unspecified: Secondary | ICD-10-CM | POA: Diagnosis not present

## 2020-01-20 DIAGNOSIS — E559 Vitamin D deficiency, unspecified: Secondary | ICD-10-CM

## 2020-01-20 NOTE — Progress Notes (Signed)
Subjective:  Patient ID: Jared Schmidt, male    DOB: 1936/07/26  Age: 84 y.o. MRN: [id number]  CC:  Chief Complaint  Patient presents with  . Establish Care      HPI  HPI [name] is an 84 year old male patient who presents today to establish care.  Previously a patient of Dr. Luan Schmidt, was established with Dr. Holly Schmidt.  Is here today with his wife to establish care as Dr. Holly Schmidt is leaving town.   He denies having any issues of concern to discuss today.  As he was previously and most recently seen in the last month.  Reports taking all his medications without any issue.  Him and his wife live together and eating.  He is up-to-date on all health care issues does need to get updated labs but he will get those at the beginning of April.  Most recently had a colonoscopy about a year and a half ago reports that he will not have any more. Does not need to see a dentist he has dentures that fit very well.  Denies having any trouble eating. Reports that he has glasses that are good and he has no trouble seeing.  Denies having any trouble with his memory.  Wife reports that he has a little bit of trouble with his memory she manages his medications.  But he is still driving.  Of note he is being treated for iron deficiency anemia/varices.  Requires iron transfusions is followed by hematology.  Takes Protonix for this and it is stable.  Today patient denies signs and symptoms of COVID 19 infection including fever, chills, cough, shortness of breath, and headache. Past Medical, Surgical, Social History, Allergies, and Medications have been Reviewed.   Past Medical History:  Diagnosis Date  . Anemia   . Arthritis   . GERD (gastroesophageal reflux disease)   . Hypercholesteremia   . Hypertension   . Iron deficiency anemia due to chronic blood loss 03/17/2018  . Iron deficiency anemia due to chronic blood loss 03/17/2018  . Stroke ([place])   . Symptomatic anemia 01/20/2018  . Vitamin B  deficiency    patient denies    Current Meds  Medication Sig  . alfuzosin (UROXATRAL) 10 MG 24 hr tablet Take 10 mg by mouth at bedtime.  Marland Kitchen amLODipine (NORVASC) 5 MG tablet Take 5 mg by mouth daily.  Marland Kitchen aspirin EC 81 MG tablet Take 81 mg by mouth daily.  Marland Kitchen atorvastatin (LIPITOR) 10 MG tablet Take 10 mg by mouth daily with supper.   . carvedilol (COREG) 12.5 MG tablet Take 12.5 mg by mouth 2 (two) times daily with a meal.  . cholecalciferol (VITAMIN D3) 25 MCG (1000 UNIT) tablet Take 1,000 Units by mouth daily.  . clotrimazole-betamethasone (LOTRISONE) cream Apply 1 application topically 2 (two) times daily.  . Ferrous Sulfate (IRON) 325 (65 Fe) MG TABS Take 1 tablet (325 mg total) by mouth daily.  . Multiple Vitamins-Minerals (CENTRUM SILVER PO) Take 1 tablet by mouth daily.  . pantoprazole (PROTONIX) 40 MG tablet Take 1 tablet (40 mg total) by mouth daily.  . vitamin C (ASCORBIC ACID) 500 MG tablet Take 500 mg by mouth daily.  . vitamin E 100 UNIT capsule Take 100 Units by mouth daily.     ROS:  Review of Systems  Constitutional: Negative.   HENT: Negative.   Eyes: Negative.   Respiratory: Negative.   Cardiovascular: Negative.   Gastrointestinal: Negative.   Genitourinary: Negative.  Musculoskeletal: Negative.   Skin: Negative.   Neurological: Negative.   Endo/Heme/Allergies: Negative.   Psychiatric/Behavioral: Negative.   All other systems reviewed and are negative.    Objective:   Today's Vitals: BP 130/78   Pulse 72   Temp (!) 97 F (36.1 C) (Temporal)   Ht 5\' 7"  (1.702 m)   Wt 149 lb (67.6 kg)   SpO2 94%   BMI 23.34 kg/m  Vitals with BMI 01/20/2020 12/16/2019 11/08/2019  Height 5\' 7"  5\' 7"  -  Weight 149 lbs 149 lbs 10 oz -  BMI 16.10 96.04 -  Systolic 540 981 191  Diastolic 78 76 75  Pulse 72 84 65     Physical Exam Vitals and nursing note reviewed.  Constitutional:      Appearance: Normal appearance. He is well-developed, well-groomed and normal weight.    HENT:     Head: Normocephalic and atraumatic.     Right Ear: External ear normal.     Left Ear: External ear normal.     Mouth/Throat:     Dentition: Has dentures.     Comments: Mask in place  Eyes:     General: Lids are normal.        Right eye: No discharge.        Left eye: No discharge.     Extraocular Movements: Extraocular movements intact.     Conjunctiva/sclera: Conjunctivae normal.     Comments: glasses  Cardiovascular:     Rate and Rhythm: Normal rate and regular rhythm.     Pulses: Normal pulses.     Heart sounds: Normal heart sounds.  Pulmonary:     Effort: Pulmonary effort is normal.     Breath sounds: Normal breath sounds.  Musculoskeletal:        General: Normal range of motion.     Cervical back: Normal range of motion and neck supple.  Skin:    General: Skin is warm.  Neurological:     General: No focal deficit present.     Mental Status: He is alert and oriented to person, place, and time.  Psychiatric:        Attention and Perception: Attention normal.        Mood and Affect: Mood normal.        Speech: Speech normal.        Behavior: Behavior normal. Behavior is cooperative.        Thought Content: Thought content normal.        Cognition and Memory: Cognition normal.        Judgment: Judgment normal.     Assessment   1. Essential hypertension   2. Gastroesophageal reflux disease without esophagitis   3. Hypercholesteremia   4. Vitamin D deficiency   5. Iron deficiency anemia due to chronic blood loss     Tests ordered No orders of the defined types were placed in this encounter.    Plan: Please see assessment and plan per problem list above.   No orders of the defined types were placed in this encounter.  30 minutes of face-to-face time  Patient to follow-up in 4 months.  Perlie Mayo, NP

## 2020-01-20 NOTE — Assessment & Plan Note (Signed)
Controlled, continue current medications at this time.  Encouraged DASH diet and mobility and activity as tolerated

## 2020-01-20 NOTE — Assessment & Plan Note (Signed)
Needs updated labs continue Lipitor for now.  Encourage low-fat diet.

## 2020-01-20 NOTE — Patient Instructions (Signed)
I appreciate the opportunity to provide you with care for your health and wellness. Today we discussed: establish care  Follow up: 4 months with wife same day   Labs as previously ordered  Please continue to practice social distancing to keep you, your family, and our community safe.  If you must go out, please wear a mask and practice good handwashing.  It was a pleasure to see you and I look forward to continuing to work together on your health and well-being. Please do not hesitate to call the office if you need care or have questions about your care.  Have a wonderful day and week. With Gratitude, Cherly Beach, DNP, AGNP-BC

## 2020-01-20 NOTE — Assessment & Plan Note (Signed)
Receiving treatment for iron deficiency anemia.  Continue current treatment as already in place.

## 2020-01-20 NOTE — Assessment & Plan Note (Signed)
Is maintained on vitamin D OTC.  We will get updated labs later in the year.

## 2020-01-20 NOTE — Assessment & Plan Note (Signed)
Is maintained on Protonix denies having any acid reflux symptoms.

## 2020-02-01 ENCOUNTER — Ambulatory Visit: Payer: PPO | Attending: Internal Medicine

## 2020-02-01 DIAGNOSIS — Z23 Encounter for immunization: Secondary | ICD-10-CM

## 2020-02-01 NOTE — Progress Notes (Signed)
   Covid-19 Vaccination Clinic  Name:  Jared Schmidt    MRN: [id number] DOB: 1936/03/24  02/01/2020  Jared Schmidt was observed post Covid-19 immunization for 15 minutes without incident. He was provided with Vaccine Information Sheet and instruction to access the V-Safe system.   Jared Schmidt was instructed to call 911 with any severe reactions post vaccine: Marland Kitchen Difficulty breathing  . Swelling of face and throat  . A fast heartbeat  . A bad rash all over body  . Dizziness and weakness   Immunizations Administered    Name Date Dose VIS Date Route   Pfizer COVID-19 Vaccine 02/01/2020  1:06 PM 0.3 mL 10/15/2019 Intramuscular   Manufacturer: [place]   Lot: [id number]   [place]: [id number]

## 2020-02-16 ENCOUNTER — Other Ambulatory Visit: Payer: Self-pay

## 2020-02-16 ENCOUNTER — Other Ambulatory Visit (HOSPITAL_COMMUNITY)
Admission: RE | Admit: 2020-02-16 | Discharge: 2020-02-16 | Disposition: A | Discharge status: Home / Self Care | Payer: PPO | Source: Ambulatory Visit | Attending: Family Medicine | Admitting: Family Medicine

## 2020-02-16 ENCOUNTER — Inpatient Hospital Stay (HOSPITAL_COMMUNITY): Payer: PPO | Attending: Hematology

## 2020-02-16 DIAGNOSIS — D500 Iron deficiency anemia secondary to blood loss (chronic): Secondary | ICD-10-CM | POA: Diagnosis not present

## 2020-02-16 DIAGNOSIS — Z87891 Personal history of nicotine dependence: Secondary | ICD-10-CM | POA: Insufficient documentation

## 2020-02-16 DIAGNOSIS — I10 Essential (primary) hypertension: Secondary | ICD-10-CM | POA: Insufficient documentation

## 2020-02-16 DIAGNOSIS — Z8673 Personal history of transient ischemic attack (TIA), and cerebral infarction without residual deficits: Secondary | ICD-10-CM | POA: Insufficient documentation

## 2020-02-16 DIAGNOSIS — E7800 Pure hypercholesterolemia, unspecified: Secondary | ICD-10-CM | POA: Diagnosis present

## 2020-02-16 DIAGNOSIS — M1990 Unspecified osteoarthritis, unspecified site: Secondary | ICD-10-CM | POA: Insufficient documentation

## 2020-02-16 DIAGNOSIS — D509 Iron deficiency anemia, unspecified: Secondary | ICD-10-CM | POA: Insufficient documentation

## 2020-02-16 DIAGNOSIS — Z79899 Other long term (current) drug therapy: Secondary | ICD-10-CM | POA: Insufficient documentation

## 2020-02-16 DIAGNOSIS — K219 Gastro-esophageal reflux disease without esophagitis: Secondary | ICD-10-CM | POA: Insufficient documentation

## 2020-02-16 DIAGNOSIS — E539 Vitamin B deficiency, unspecified: Secondary | ICD-10-CM | POA: Insufficient documentation

## 2020-02-16 LAB — CBC WITH DIFFERENTIAL/PLATELET
Abs Immature Granulocytes: 0.01 10*3/uL (ref 0.00–0.07)
Basophils Absolute: 0 10*3/uL (ref 0.0–0.1)
Basophils Relative: 1 %
Eosinophils Absolute: 0.2 10*3/uL (ref 0.0–0.5)
Eosinophils Relative: 2 %
HCT: 36.9 % — ABNORMAL LOW (ref 39.0–52.0)
Hemoglobin: 10.8 g/dL — ABNORMAL LOW (ref 13.0–17.0)
Immature Granulocytes: 0 %
Lymphocytes Relative: 8 %
Lymphs Abs: 0.5 10*3/uL — ABNORMAL LOW (ref 0.7–4.0)
MCH: 25.2 pg — ABNORMAL LOW (ref 26.0–34.0)
MCHC: 29.3 g/dL — ABNORMAL LOW (ref 30.0–36.0)
MCV: 86.2 fL (ref 80.0–100.0)
Monocytes Absolute: 0.7 10*3/uL (ref 0.1–1.0)
Monocytes Relative: 11 %
Neutro Abs: 5 10*3/uL (ref 1.7–7.7)
Neutrophils Relative %: 78 %
Platelets: 333 10*3/uL (ref 150–400)
RBC: 4.28 MIL/uL (ref 4.22–5.81)
RDW: 17.3 % — ABNORMAL HIGH (ref 11.5–15.5)
WBC: 6.4 10*3/uL (ref 4.0–10.5)
nRBC: 0 % (ref 0.0–0.2)

## 2020-02-16 LAB — COMPREHENSIVE METABOLIC PANEL
ALT: 14 U/L (ref 0–44)
AST: 19 U/L (ref 15–41)
Albumin: 3.9 g/dL (ref 3.5–5.0)
Alkaline Phosphatase: 103 U/L (ref 38–126)
Anion gap: 7 (ref 5–15)
BUN: 18 mg/dL (ref 8–23)
CO2: 26 mmol/L (ref 22–32)
Calcium: 9.5 mg/dL (ref 8.9–10.3)
Chloride: 107 mmol/L (ref 98–111)
Creatinine, Ser: 1.11 mg/dL (ref 0.61–1.24)
GFR calc Af Amer: >60 mL/min (ref >60)
GFR calc non Af Amer: >60 mL/min (ref >60)
Glucose, Bld: 103 mg/dL — ABNORMAL HIGH (ref 70–99)
Potassium: 4.2 mmol/L (ref 3.5–5.1)
Sodium: 140 mmol/L (ref 135–145)
Total Bilirubin: 0.4 mg/dL (ref 0.3–1.2)
Total Protein: 6.9 g/dL (ref 6.5–8.1)

## 2020-02-16 LAB — LIPID PANEL
Cholesterol: 132 mg/dL (ref 0–200)
HDL: 39 mg/dL — ABNORMAL LOW (ref >40)
LDL Cholesterol: 73 mg/dL (ref 0–99)
Total CHOL/HDL Ratio: 3.4 RATIO
Triglycerides: 99 mg/dL (ref <150)
VLDL: 20 mg/dL (ref 0–40)

## 2020-02-16 LAB — IRON AND TIBC
Iron: 23 ug/dL — ABNORMAL LOW (ref 45–182)
Saturation Ratios: 7 % — ABNORMAL LOW (ref 17.9–39.5)
TIBC: 334 ug/dL (ref 250–450)
UIBC: 311 ug/dL

## 2020-02-16 LAB — FOLATE: Folate: 36.6 ng/mL (ref >5.9)

## 2020-02-16 LAB — VITAMIN B12: Vitamin B-12: 1268 pg/mL — ABNORMAL HIGH (ref 180–914)

## 2020-02-16 LAB — FERRITIN: Ferritin: 34 ng/mL (ref 24–336)

## 2020-02-16 LAB — LACTATE DEHYDROGENASE: LDH: 126 U/L (ref 98–192)

## 2020-02-16 LAB — VITAMIN D 25 HYDROXY (VIT D DEFICIENCY, FRACTURES): Vit D, 25-Hydroxy: 47.06 ng/mL (ref 30–100)

## 2020-02-17 ENCOUNTER — Other Ambulatory Visit: Payer: Self-pay | Admitting: *Deleted

## 2020-02-17 MED ORDER — AMLODIPINE BESYLATE 5 MG PO TABS: 5.0000 mg | ORAL_TABLET | Freq: Every day | ORAL | 5 refills | Status: DC

## 2020-02-17 MED ORDER — ATORVASTATIN CALCIUM 10 MG PO TABS: 10.0000 mg | ORAL_TABLET | Freq: Every day | ORAL | 3 refills | Status: DC

## 2020-02-23 ENCOUNTER — Other Ambulatory Visit: Payer: Self-pay

## 2020-02-23 ENCOUNTER — Inpatient Hospital Stay (HOSPITAL_BASED_OUTPATIENT_CLINIC_OR_DEPARTMENT_OTHER): Payer: PPO | Admitting: Nurse Practitioner

## 2020-02-23 ENCOUNTER — Encounter (HOSPITAL_COMMUNITY): Payer: Self-pay | Admitting: Nurse Practitioner

## 2020-02-23 DIAGNOSIS — Z87891 Personal history of nicotine dependence: Secondary | ICD-10-CM | POA: Diagnosis not present

## 2020-02-23 DIAGNOSIS — Z79899 Other long term (current) drug therapy: Secondary | ICD-10-CM | POA: Diagnosis not present

## 2020-02-23 DIAGNOSIS — E7800 Pure hypercholesterolemia, unspecified: Secondary | ICD-10-CM | POA: Diagnosis not present

## 2020-02-23 DIAGNOSIS — D500 Iron deficiency anemia secondary to blood loss (chronic): Secondary | ICD-10-CM | POA: Diagnosis not present

## 2020-02-23 DIAGNOSIS — M1990 Unspecified osteoarthritis, unspecified site: Secondary | ICD-10-CM | POA: Diagnosis not present

## 2020-02-23 DIAGNOSIS — E539 Vitamin B deficiency, unspecified: Secondary | ICD-10-CM | POA: Diagnosis not present

## 2020-02-23 DIAGNOSIS — D509 Iron deficiency anemia, unspecified: Secondary | ICD-10-CM | POA: Diagnosis not present

## 2020-02-23 DIAGNOSIS — K219 Gastro-esophageal reflux disease without esophagitis: Secondary | ICD-10-CM | POA: Diagnosis not present

## 2020-02-23 DIAGNOSIS — Z8673 Personal history of transient ischemic attack (TIA), and cerebral infarction without residual deficits: Secondary | ICD-10-CM | POA: Diagnosis not present

## 2020-02-23 DIAGNOSIS — I10 Essential (primary) hypertension: Secondary | ICD-10-CM | POA: Diagnosis not present

## 2020-02-23 NOTE — Assessment & Plan Note (Signed)
1.  Iron deficiency anemia: -Likely from combination of chronic GI blood loss and malabsorption. 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  Normal duodenal bulb and second part of duodenum. -Colonoscopy on 06/24/2018 showed internal hemorrhoids, few medium mouth diverticula in the entire colon.  Subcentimeter polyp in the sigmoid colon. -Bone marrow biopsy on 08/21/2018 showed normocellular marrow with erythroid hyperplasia.  No increase in plasma cells. -Denies any bright red bleeding per rectum or melena. -He is taking aspirin 81 mg daily and iron tablet daily.  According to the wife he reportedly had a stroke and was placed on aspirin. -Last Feraheme was on 11/01/2019 and 11/08/2019 -Labs done on 02/16/2020 showed hemoglobin 10.8, WBC 6.4, platelets 333, ferritin 34, percent saturation 7, vitamin B12 1268 -We will give him 2 more iron infusions due to him still having some fatigue. -We will see him back in 3 months with repeat labs.

## 2020-02-23 NOTE — Patient Instructions (Signed)
[facility]  Discharge Instructions:  You saw [name], NP, today. _______________________________________________________________  Thank you for choosing [facility] to provide your oncology and hematology care.  To afford each patient quality time with our providers, please arrive at least 15 minutes before your scheduled appointment.  You need to re-schedule your appointment if you arrive 10 or more minutes late.  We strive to give you quality time with our providers, and arriving late affects you and other patients whose appointments are after yours.  Also, if you no show three or more times for appointments you may be dismissed from the clinic.  Again, thank you for choosing [facility]. Our hope is that these requests will allow you access to exceptional care and in a timely manner. _______________________________________________________________  If you have questions after your visit, please contact our office at [phone] between the hours of 8:30 a.m. and 5:00 p.m. Voicemails left after 4:30 p.m. will not be returned until the following business day. _______________________________________________________________  For prescription refill requests, have your pharmacy contact our office. _______________________________________________________________  Recommendations made by the consultant and any test results will be sent to your referring physician. _______________________________________________________________ 

## 2020-02-23 NOTE — Progress Notes (Signed)
Jared Schmidt, [place] [address]   CLINIC:  Medical Oncology/Hematology  PCP:  Perlie Mayo, NP [place] [address] [phone]   REASON FOR VISIT: Follow-up for iron deficiency anemia   CURRENT THERAPY: Intermittent iron infusions   INTERVAL HISTORY:  Jared Schmidt 84 y.o. male returns for routine follow-up for iron deficiency anemia.  Patient reports he has been doing well since his last visit he does report more fatigued than normal.  He does report occasional bleeding from hemorrhoids. Denies any nausea, vomiting, or diarrhea. Denies any new pains. Had not noticed any recent bleeding such as epistaxis, hematuria or hematochezia. Denies recent chest pain on exertion, shortness of breath on minimal exertion, pre-syncopal episodes, or palpitations. Denies any numbness or tingling in hands or feet. Denies any recent fevers, infections, or recent hospitalizations. Patient reports appetite at 100% and energy level at 75%.  He is eating well maintain his weight at this time.     REVIEW OF SYSTEMS:  Review of Systems  Constitutional: Positive for fatigue.  All other systems reviewed and are negative.    PAST MEDICAL/SURGICAL HISTORY:  Past Medical History:  Diagnosis Date  . Anemia   . Arthritis   . GERD (gastroesophageal reflux disease)   . Hypercholesteremia   . Hypertension   . Iron deficiency anemia due to chronic blood loss 03/17/2018  . Iron deficiency anemia due to chronic blood loss 03/17/2018  . Stroke ([place])   . Symptomatic anemia 01/20/2018  . Vitamin B deficiency    patient denies   Past Surgical History:  Procedure Laterality Date  . BIOPSY  06/24/2018   Procedure: BIOPSY;  Surgeon: Daneil Dolin, MD;  [facility];  Service: Endoscopy;;  gastric   . COLONOSCOPY N/A 06/24/2018   Dr. Gala Romney: Internal hemorrhoids, diverticulosis, 9 mm polyp removed from the sigmoid colon which was hyperplastic.  Marland Kitchen  ESOPHAGOGASTRODUODENOSCOPY N/A 06/24/2018   Dr. Gala Romney: Multiple erosions in the stomach and duodenal bulb, gastric biopsies with chronic gastritis with intestinal metaplasia, no H. pylori.  . ESOPHAGOGASTRODUODENOSCOPY (EGD) WITH PROPOFOL N/A 09/17/2018   Procedure: ESOPHAGOGASTRODUODENOSCOPY (EGD) WITH PROPOFOL;  Surgeon: Daneil Dolin, MD;  [facility];  Service: Endoscopy;  Laterality: N/A;  11:00am  . GIVENS CAPSULE STUDY N/A 09/15/2018   Procedure: GIVENS CAPSULE STUDY;  Surgeon: Daneil Dolin, MD;  [facility];  Service: Endoscopy;  Laterality: N/A;  7:30am  . none    . POLYPECTOMY  06/24/2018   Procedure: POLYPECTOMY;  Surgeon: Daneil Dolin, MD;  [facility];  Service: Endoscopy;;  sigmoid polyp hs     SOCIAL HISTORY:  Social History   Socioeconomic History  . Marital status: Married    Spouse name: Jared Schmidt  . Number of children: Not on file  . Years of education: Not on file  . Highest education level: 8th grade  Occupational History  . Occupation: retired  Tobacco Use  . Smoking status: Former Smoker    Packs/day: 0.50    Years: 67.00    Pack years: 33.50    Types: Cigarettes    Quit date: 12/11/2017    Years since quitting: 2.2  . Smokeless tobacco: Never Used  Substance and Sexual Activity  . Alcohol use: No  . Drug use: No  . Sexual activity: Not Currently  Other Topics Concern  . Not on file  Social History Narrative   Lives with  wife Jared Schmidt in [place]   Dog named poppet      Enjoys hanging around [employer]: eats all food groups   Caffeine: coffee   Water: 4-5 cups daily      Wears seat belt    Does not use phone while driving    Oceanographer at home    Social Determinants of Health   Financial Resource Strain: Low Risk   . Difficulty of Paying Living Expenses: Not hard at all  Food Insecurity: No Food Insecurity  . Worried About Charity fundraiser in the Last Year: Never true  . Ran Out of  Food in the Last Year: Never true  Transportation Needs: No Transportation Needs  . Lack of Transportation (Medical): No  . Lack of Transportation (Non-Medical): No  Physical Activity:   . Days of Exercise per Week:   . Minutes of Exercise per Session:   Stress:   . Feeling of Stress :   Social Connections: Moderately Isolated  . Frequency of Communication with Friends and Family: Never  . Frequency of Social Gatherings with Friends and Family: Never  . Attends Religious Services: Never  . Active Member of Clubs or Organizations: No  . Attends Archivist Meetings: Never  . Marital Status: Married  Human resources officer Violence: Not At Risk  . Fear of Current or Ex-Partner: No  . Emotionally Abused: No  . Physically Abused: No  . Sexually Abused: No    FAMILY HISTORY:  Family History  Problem Relation Age of Onset  . Colon cancer Neg Hx     CURRENT MEDICATIONS:  Outpatient Encounter Medications as of 02/23/2020  Medication Sig  . alfuzosin (UROXATRAL) 10 MG 24 hr tablet Take 10 mg by mouth at bedtime.  Marland Kitchen amLODipine (NORVASC) 5 MG tablet Take 1 tablet (5 mg total) by mouth daily.  Marland Kitchen aspirin EC 81 MG tablet Take 81 mg by mouth daily.  Marland Kitchen atorvastatin (LIPITOR) 10 MG tablet Take 1 tablet (10 mg total) by mouth daily with supper.  . carvedilol (COREG) 12.5 MG tablet Take 12.5 mg by mouth 2 (two) times daily with a meal.  . cholecalciferol (VITAMIN D3) 25 MCG (1000 UNIT) tablet Take 1,000 Units by mouth daily.  . clotrimazole-betamethasone (LOTRISONE) cream Apply 1 application topically 2 (two) times daily.  . Ferrous Sulfate (IRON) 325 (65 Fe) MG TABS Take 1 tablet (325 mg total) by mouth daily.  . Multiple Vitamins-Minerals (CENTRUM SILVER PO) Take 1 tablet by mouth daily.  . pantoprazole (PROTONIX) 40 MG tablet Take 1 tablet (40 mg total) by mouth daily.  . vitamin C (ASCORBIC ACID) 500 MG tablet Take 500 mg by mouth daily.  . vitamin E 100 UNIT capsule Take 100 Units by  mouth daily.    No facility-administered encounter medications on file as of 02/23/2020.    ALLERGIES:  No Known Allergies   PHYSICAL EXAM:  ECOG Performance status: 1  Vitals:   02/23/20 1135  BP: 136/74  Pulse: 69  Resp: 16  Temp: (!) 96.9 F (36.1 C)  SpO2: 100%   Filed Weights   02/23/20 1135  Weight: 147 lb 6.4 oz (66.9 kg)   Physical Exam Constitutional:      Appearance: Normal appearance. He is normal weight.  Cardiovascular:     Rate and Rhythm: Normal rate and regular rhythm.     Heart sounds: Normal heart sounds.  Pulmonary:     Effort: Pulmonary effort is  normal.     Breath sounds: Normal breath sounds.  Abdominal:     General: Bowel sounds are normal.     Palpations: Abdomen is soft.  Musculoskeletal:        General: Normal range of motion.  Skin:    General: Skin is warm.  Neurological:     Mental Status: He is alert and oriented to person, place, and time. Mental status is at baseline.  Psychiatric:        Mood and Affect: Mood normal.        Behavior: Behavior normal.        Thought Content: Thought content normal.        Judgment: Judgment normal.      LABORATORY DATA:  I have reviewed the labs as listed.  CBC    Component Value Date/Time   WBC 6.4 02/16/2020 1111   RBC 4.28 02/16/2020 1111   HGB 10.8 (L) 02/16/2020 1111   HCT 36.9 (L) 02/16/2020 1111   PLT 333 02/16/2020 1111   MCV 86.2 02/16/2020 1111   MCH 25.2 (L) 02/16/2020 1111   MCHC 29.3 (L) 02/16/2020 1111   RDW 17.3 (H) 02/16/2020 1111   LYMPHSABS 0.5 (L) 02/16/2020 1111   MONOABS 0.7 02/16/2020 1111   EOSABS 0.2 02/16/2020 1111   BASOSABS 0.0 02/16/2020 1111   CMP Latest Ref Rng & Units 02/16/2020 10/18/2019 07/20/2019  Glucose 70 - 99 mg/dL 103(H) 97 120(H)  BUN 8 - 23 mg/dL '18 17 14  ' Creatinine 0.61 - 1.24 mg/dL 1.11 1.30(H) 1.03  Sodium 135 - 145 mmol/L 140 140 140  Potassium 3.5 - 5.1 mmol/L 4.2 4.3 4.1  Chloride 98 - 111 mmol/L 107 107 109  CO2 22 - 32 mmol/L  '26 24 23  ' Calcium 8.9 - 10.3 mg/dL 9.5 9.3 9.2  Total Protein 6.5 - 8.1 g/dL 6.9 7.0 6.7  Total Bilirubin 0.3 - 1.2 mg/dL 0.4 0.6 0.5  Alkaline Phos 38 - 126 U/L 103 83 91  AST 15 - 41 U/L '19 22 22  ' ALT 0 - 44 U/L '14 12 14    ' All questions were answered to patient's stated satisfaction. Encouraged patient to call with any new concerns or questions before his next visit to the cancer center and we can certain see him sooner, if needed.     ASSESSMENT & PLAN:  Iron deficiency anemia due to chronic blood loss 1.  Iron deficiency anemia: -Likely from combination of chronic GI blood loss and malabsorption. 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  Normal duodenal bulb and second part of duodenum. -Colonoscopy on 06/24/2018 showed internal hemorrhoids, few medium mouth diverticula in the entire colon.  Subcentimeter polyp in the sigmoid colon. -Bone marrow biopsy on 08/21/2018 showed normocellular marrow with erythroid hyperplasia.  No increase in plasma cells. -Denies any bright red bleeding per rectum or melena. -He is taking aspirin 81 mg daily and iron tablet daily.  According to the wife he reportedly had a stroke and was placed on aspirin. -Last Feraheme was on 11/01/2019 and 11/08/2019 -Labs done on 02/16/2020 showed hemoglobin 10.8, WBC 6.4, platelets 333, ferritin 34, percent saturation 7, vitamin B12 1268 -We will give him 2 more iron infusions due to him still having some fatigue. -We will see him back in 3 months with repeat labs.     Orders placed this encounter:  Orders Placed This Encounter  Procedures  .  Lactate dehydrogenase  . CBC with Differential/Platelet  . Comprehensive metabolic panel  . Ferritin  . Iron and TIBC  . Vitamin B12  . VITAMIN D 25 Hydroxy (Vit-D Deficiency, Fractures)  . Folate      Francene Finders, FNP-C [place] [phone]

## 2020-03-08 ENCOUNTER — Ambulatory Visit (INDEPENDENT_AMBULATORY_CARE_PROVIDER_SITE_OTHER): Payer: PPO | Admitting: Urology

## 2020-03-08 ENCOUNTER — Other Ambulatory Visit: Payer: Self-pay

## 2020-03-08 ENCOUNTER — Encounter: Payer: Self-pay | Admitting: Urology

## 2020-03-08 VITALS — BP 131/81 | HR 74 | Temp 98.1°F | Ht 67.0 in | Wt 147.0 lb

## 2020-03-08 DIAGNOSIS — R351 Nocturia: Secondary | ICD-10-CM | POA: Diagnosis not present

## 2020-03-08 DIAGNOSIS — R3912 Poor urinary stream: Secondary | ICD-10-CM | POA: Diagnosis not present

## 2020-03-08 LAB — BLADDER SCAN AMB NON-IMAGING: Scan Result: 111.7

## 2020-03-08 MED ORDER — SILODOSIN 8 MG PO CAPS: 8.0000 mg | ORAL_CAPSULE | Freq: Every day | ORAL | 3 refills | Status: DC

## 2020-03-08 NOTE — Progress Notes (Signed)

## 2020-03-08 NOTE — Patient Instructions (Signed)

## 2020-03-08 NOTE — Progress Notes (Signed)
03/08/2020 3:11 PM   Jared Schmidt [birth year] [id number]  Referring provider: Sinda Du, MD No address on file  Weak stream and nocturia  HPI: Mr Jared Schmidt is a 84yo here for followup for weak stream and nocturia. Since last visit his stream is weaker and he has nocturia 3-4x. He was supposed to be on uroxatral BID but he is now taking it once a day. No dysuria. No feeling of incomplete emptying. Nocturia and stream worsened.     PMH: Past Medical History:  Diagnosis Date  . Anemia   . Arthritis   . GERD (gastroesophageal reflux disease)   . Hypercholesteremia   . Hypertension   . Iron deficiency anemia due to chronic blood loss 03/17/2018  . Iron deficiency anemia due to chronic blood loss 03/17/2018  . Stroke ([place])   . Symptomatic anemia 01/20/2018  . Vitamin B deficiency    patient denies    Surgical History: Past Surgical History:  Procedure Laterality Date  . BIOPSY  06/24/2018   Procedure: BIOPSY;  Surgeon: Daneil Dolin, MD;  [facility];  Service: Endoscopy;;  gastric   . COLONOSCOPY N/A 06/24/2018   Dr. Gala Romney: Internal hemorrhoids, diverticulosis, 9 mm polyp removed from the sigmoid colon which was hyperplastic.  Marland Kitchen ESOPHAGOGASTRODUODENOSCOPY N/A 06/24/2018   Dr. Gala Romney: Multiple erosions in the stomach and duodenal bulb, gastric biopsies with chronic gastritis with intestinal metaplasia, no H. pylori.  . ESOPHAGOGASTRODUODENOSCOPY (EGD) WITH PROPOFOL N/A 09/17/2018   Procedure: ESOPHAGOGASTRODUODENOSCOPY (EGD) WITH PROPOFOL;  Surgeon: Daneil Dolin, MD;  [facility];  Service: Endoscopy;  Laterality: N/A;  11:00am  . GIVENS CAPSULE STUDY N/A 09/15/2018   Procedure: GIVENS CAPSULE STUDY;  Surgeon: Daneil Dolin, MD;  [facility];  Service: Endoscopy;  Laterality: N/A;  7:30am  . none    . POLYPECTOMY  06/24/2018   Procedure: POLYPECTOMY;  Surgeon: Daneil Dolin, MD;  [facility];  Service: Endoscopy;;   sigmoid polyp hs    Home Medications:  Allergies as of 03/08/2020   No Known Allergies     Medication List       Accurate as of Mar 08, 2020  3:11 PM. If you have any questions, ask your nurse or doctor.        alfuzosin 10 MG 24 hr tablet Commonly known as: UROXATRAL Take 10 mg by mouth at bedtime.   amLODipine 5 MG tablet Commonly known as: NORVASC Take 1 tablet (5 mg total) by mouth daily.   aspirin EC 81 MG tablet Take 81 mg by mouth daily.   atorvastatin 10 MG tablet Commonly known as: LIPITOR Take 1 tablet (10 mg total) by mouth daily with supper.   carvedilol 12.5 MG tablet Commonly known as: COREG Take 12.5 mg by mouth 2 (two) times daily with a meal.   CENTRUM SILVER PO Take 1 tablet by mouth daily.   cholecalciferol 25 MCG (1000 UNIT) tablet Commonly known as: VITAMIN D3 Take 1,000 Units by mouth daily.   clotrimazole-betamethasone cream Commonly known as: LOTRISONE Apply 1 application topically 2 (two) times daily.   Iron 325 (65 Fe) MG Tabs Take 1 tablet (325 mg total) by mouth daily.   pantoprazole 40 MG tablet Commonly known as: PROTONIX Take 1 tablet (40 mg total) by mouth daily.   vitamin C 500 MG tablet Commonly known as: ASCORBIC ACID Take 500 mg by mouth daily.   vitamin E 45 MG (100 UNITS) capsule Take  100 Units by mouth daily.       Allergies: No Known Allergies  Family History: Family History  Problem Relation Age of Onset  . Colon cancer Neg Hx     Social History:  reports that he quit smoking about 2 years ago. His smoking use included cigarettes. He has a 33.50 pack-year smoking history. He has never used smokeless tobacco. He reports that he does not drink alcohol or use drugs.  ROS: All other review of systems were reviewed and are negative except what is noted above in HPI  Physical Exam: BP 131/81   Pulse 74   Temp 98.1 F (36.7 C)   Ht 5\' 7"  (1.702 m)   Wt 147 lb (66.7 kg)   BMI 23.02 kg/m   Constitutional:   Alert and oriented, No acute distress. HEENT: [name] AT, moist mucus membranes.  Trachea midline, no masses. Cardiovascular: No clubbing, cyanosis, or edema. Respiratory: Normal respiratory effort, no increased work of breathing. GI: Abdomen is soft, nontender, nondistended, no abdominal masses GU: No CVA tenderness.  Lymph: No cervical or inguinal lymphadenopathy. Skin: No rashes, bruises or suspicious lesions. Neurologic: Grossly intact, no focal deficits, moving all 4 extremities. Psychiatric: Normal mood and affect.  Laboratory Data: Lab Results  Component Value Date   WBC 6.4 02/16/2020   HGB 10.8 (L) 02/16/2020   HCT 36.9 (L) 02/16/2020   MCV 86.2 02/16/2020   PLT 333 02/16/2020    Lab Results  Component Value Date   CREATININE 1.11 02/16/2020    No results found for: PSA  No results found for: TESTOSTERONE  No results found for: HGBA1C  Urinalysis    Component Value Date/Time   COLORURINE YELLOW 08/01/2018 1338   APPEARANCEUR HAZY (A) 08/01/2018 1338   LABSPEC 1.017 08/01/2018 1338   PHURINE 5.0 08/01/2018 1338   GLUCOSEU NEGATIVE 08/01/2018 [place] 08/01/2018 [place] 08/01/2018 1338   KETONESUR NEGATIVE 08/01/2018 1338   PROTEINUR NEGATIVE 08/01/2018 1338   NITRITE POSITIVE (A) 08/01/2018 1338   LEUKOCYTESUR LARGE (A) 08/01/2018 1338    Lab Results  Component Value Date   BACTERIA MANY (A) 08/01/2018    Pertinent Imaging:  No results found for this or any previous visit. No results found for this or any previous visit. No results found for this or any previous visit. No results found for this or any previous visit. No results found for this or any previous visit. No results found for this or any previous visit. No results found for this or any previous visit. No results found for this or any previous visit.  Assessment & Plan:    1. Weak urinary stream -stop uroxatral, start rapaflo 8mg  daily  2.  Nocturia Start rapaflo 8mg . Decrease fluid prior to bed   No follow-ups on file.  Nicolette Bang, MD  [facility]: [facility] [place]

## 2020-03-09 ENCOUNTER — Inpatient Hospital Stay (HOSPITAL_COMMUNITY): Payer: PPO | Attending: Hematology

## 2020-03-09 ENCOUNTER — Encounter (HOSPITAL_COMMUNITY): Payer: Self-pay

## 2020-03-09 ENCOUNTER — Telehealth: Payer: Self-pay | Admitting: Urology

## 2020-03-09 VITALS — BP 130/63 | HR 69 | Temp 97.1°F | Resp 18

## 2020-03-09 DIAGNOSIS — D509 Iron deficiency anemia, unspecified: Secondary | ICD-10-CM | POA: Diagnosis not present

## 2020-03-09 DIAGNOSIS — D500 Iron deficiency anemia secondary to blood loss (chronic): Secondary | ICD-10-CM

## 2020-03-09 MED ORDER — SODIUM CHLORIDE 0.9 % IV SOLN: INTRAVENOUS | Status: DC

## 2020-03-09 MED ORDER — SODIUM CHLORIDE 0.9 % IV SOLN
510.0000 mg | Freq: Once | INTRAVENOUS | Status: AC
  Administered 2020-03-09: 510 mg via INTRAVENOUS
  Filled 2020-03-09: qty 510

## 2020-03-09 NOTE — Telephone Encounter (Signed)
Patient would like a nurse to return her call regarding a new medication from pts visit yesterday.

## 2020-03-09 NOTE — Progress Notes (Signed)
Patient tolerated iron infusion with no complaints voiced.  Peripheral IV site clean and dry with good blood return noted before and after infusion.  Band aid applied.  VSS with discharge and left ambulatory with no s/s of distress noted.  

## 2020-03-09 NOTE — Telephone Encounter (Signed)
Have him use good rx. It is $20

## 2020-03-10 NOTE — Telephone Encounter (Signed)
Wife notified . [facility] does not participate with [facility]. I told wife I could send to a commercial pharmacy  and she denied. She spoke with someone at [facility] and rx would cost her $103. They will pick up rx today.

## 2020-03-11 ENCOUNTER — Other Ambulatory Visit: Payer: Self-pay | Admitting: Family Medicine

## 2020-03-13 ENCOUNTER — Other Ambulatory Visit: Payer: Self-pay | Admitting: *Deleted

## 2020-03-13 MED ORDER — CARVEDILOL 12.5 MG PO TABS: 12.5000 mg | ORAL_TABLET | Freq: Two times a day (BID) | ORAL | 1 refills | Status: DC

## 2020-03-16 ENCOUNTER — Encounter (HOSPITAL_COMMUNITY): Payer: Self-pay

## 2020-03-16 ENCOUNTER — Inpatient Hospital Stay (HOSPITAL_COMMUNITY): Payer: PPO

## 2020-03-16 ENCOUNTER — Other Ambulatory Visit: Payer: Self-pay

## 2020-03-16 VITALS — BP 143/67 | HR 70 | Temp 97.7°F | Resp 18

## 2020-03-16 DIAGNOSIS — D509 Iron deficiency anemia, unspecified: Secondary | ICD-10-CM | POA: Diagnosis not present

## 2020-03-16 DIAGNOSIS — D500 Iron deficiency anemia secondary to blood loss (chronic): Secondary | ICD-10-CM

## 2020-03-16 MED ORDER — SODIUM CHLORIDE 0.9 % IV SOLN
510.0000 mg | Freq: Once | INTRAVENOUS | Status: AC
  Administered 2020-03-16: 510 mg via INTRAVENOUS
  Filled 2020-03-16: qty 510

## 2020-03-16 MED ORDER — SODIUM CHLORIDE 0.9 % IV SOLN: INTRAVENOUS | Status: DC

## 2020-03-16 NOTE — Patient Instructions (Signed)
[facility]  Discharge Instructions:   _______________________________________________________________  Thank you for choosing [facility] to provide your oncology and hematology care.  To afford each patient quality time with our providers, please arrive at least 15 minutes before your scheduled appointment.  You need to re-schedule your appointment if you arrive 10 or more minutes late.  We strive to give you quality time with our providers, and arriving late affects you and other patients whose appointments are after yours.  Also, if you no show three or more times for appointments you may be dismissed from the clinic.  Again, thank you for choosing  [facility] at [facility]. Our hope is that these requests will allow you access to exceptional care and in a timely manner. _______________________________________________________________  If you have questions after your visit, please contact our office at [phone] between the hours of 8:30 a.m. and 5:00 p.m. Voicemails left after 4:30 p.m. will not be returned until the following business day. _______________________________________________________________  For prescription refill requests, have your pharmacy contact our office. _______________________________________________________________  Recommendations made by the consultant and any test results will be sent to your referring physician. _______________________________________________________________ 

## 2020-03-16 NOTE — Progress Notes (Signed)
Patient presents today for Feraheme infusion. MAR reviewed and updated. Patient has no complaints of any changes since his last visit.   Feraheme given today per MD orders. Tolerated infusion without adverse affects. Vital signs stable. No complaints at this time. Discharged from clinic ambulatory. F/U with [facility] as scheduled.

## 2020-03-17 MED ORDER — PEGFILGRASTIM-JMDB 6 MG/0.6ML ~~LOC~~ SOSY
PREFILLED_SYRINGE | SUBCUTANEOUS | Status: AC
  Filled 2020-03-17: qty 0.6

## 2020-03-27 ENCOUNTER — Other Ambulatory Visit: Payer: Self-pay | Admitting: *Deleted

## 2020-03-27 DIAGNOSIS — K219 Gastro-esophageal reflux disease without esophagitis: Secondary | ICD-10-CM

## 2020-03-27 MED ORDER — PANTOPRAZOLE SODIUM 40 MG PO TBEC: 40.0000 mg | DELAYED_RELEASE_TABLET | Freq: Every day | ORAL | 0 refills | Status: DC

## 2020-05-09 ENCOUNTER — Telehealth: Payer: Self-pay | Admitting: Family Medicine

## 2020-05-09 ENCOUNTER — Other Ambulatory Visit: Payer: Self-pay | Admitting: *Deleted

## 2020-05-09 DIAGNOSIS — K219 Gastro-esophageal reflux disease without esophagitis: Secondary | ICD-10-CM

## 2020-05-09 MED ORDER — CARVEDILOL 12.5 MG PO TABS: 12.5000 mg | ORAL_TABLET | Freq: Two times a day (BID) | ORAL | 1 refills | Status: DC

## 2020-05-09 MED ORDER — PANTOPRAZOLE SODIUM 40 MG PO TBEC: 40.0000 mg | DELAYED_RELEASE_TABLET | Freq: Every day | ORAL | 0 refills | Status: DC

## 2020-05-09 NOTE — Telephone Encounter (Signed)
Patients wife called to a refill on prescriptions Carvedilol and pantoprazole to [facility].

## 2020-05-09 NOTE — Telephone Encounter (Signed)
These medications has been sent to the pharmacy

## 2020-05-18 ENCOUNTER — Inpatient Hospital Stay (HOSPITAL_COMMUNITY): Payer: PPO | Attending: Hematology

## 2020-05-18 ENCOUNTER — Other Ambulatory Visit: Payer: Self-pay

## 2020-05-18 DIAGNOSIS — K922 Gastrointestinal hemorrhage, unspecified: Secondary | ICD-10-CM | POA: Insufficient documentation

## 2020-05-18 DIAGNOSIS — Z87891 Personal history of nicotine dependence: Secondary | ICD-10-CM | POA: Insufficient documentation

## 2020-05-18 DIAGNOSIS — M1990 Unspecified osteoarthritis, unspecified site: Secondary | ICD-10-CM | POA: Diagnosis not present

## 2020-05-18 DIAGNOSIS — E7800 Pure hypercholesterolemia, unspecified: Secondary | ICD-10-CM | POA: Insufficient documentation

## 2020-05-18 DIAGNOSIS — Z8673 Personal history of transient ischemic attack (TIA), and cerebral infarction without residual deficits: Secondary | ICD-10-CM | POA: Diagnosis not present

## 2020-05-18 DIAGNOSIS — Z7982 Long term (current) use of aspirin: Secondary | ICD-10-CM | POA: Insufficient documentation

## 2020-05-18 DIAGNOSIS — K219 Gastro-esophageal reflux disease without esophagitis: Secondary | ICD-10-CM | POA: Diagnosis not present

## 2020-05-18 DIAGNOSIS — Z79899 Other long term (current) drug therapy: Secondary | ICD-10-CM | POA: Diagnosis not present

## 2020-05-18 DIAGNOSIS — E539 Vitamin B deficiency, unspecified: Secondary | ICD-10-CM | POA: Diagnosis not present

## 2020-05-18 DIAGNOSIS — I10 Essential (primary) hypertension: Secondary | ICD-10-CM | POA: Diagnosis not present

## 2020-05-18 DIAGNOSIS — D500 Iron deficiency anemia secondary to blood loss (chronic): Secondary | ICD-10-CM

## 2020-05-18 LAB — CBC WITH DIFFERENTIAL/PLATELET
Abs Immature Granulocytes: 0.01 10*3/uL (ref 0.00–0.07)
Basophils Absolute: 0.1 10*3/uL (ref 0.0–0.1)
Basophils Relative: 1 %
Eosinophils Absolute: 0.2 10*3/uL (ref 0.0–0.5)
Eosinophils Relative: 4 %
HCT: 36 % — ABNORMAL LOW (ref 39.0–52.0)
Hemoglobin: 10.7 g/dL — ABNORMAL LOW (ref 13.0–17.0)
Immature Granulocytes: 0 %
Lymphocytes Relative: 10 %
Lymphs Abs: 0.6 10*3/uL — ABNORMAL LOW (ref 0.7–4.0)
MCH: 25.1 pg — ABNORMAL LOW (ref 26.0–34.0)
MCHC: 29.7 g/dL — ABNORMAL LOW (ref 30.0–36.0)
MCV: 84.3 fL (ref 80.0–100.0)
Monocytes Absolute: 0.9 10*3/uL (ref 0.1–1.0)
Monocytes Relative: 14 %
Neutro Abs: 4.5 10*3/uL (ref 1.7–7.7)
Neutrophils Relative %: 71 %
Platelets: 383 10*3/uL (ref 150–400)
RBC: 4.27 MIL/uL (ref 4.22–5.81)
RDW: 18 % — ABNORMAL HIGH (ref 11.5–15.5)
WBC: 6.3 10*3/uL (ref 4.0–10.5)
nRBC: 0 % (ref 0.0–0.2)

## 2020-05-18 LAB — IRON AND TIBC
Iron: 35 ug/dL — ABNORMAL LOW (ref 45–182)
Saturation Ratios: 11 % — ABNORMAL LOW (ref 17.9–39.5)
TIBC: 316 ug/dL (ref 250–450)
UIBC: 281 ug/dL

## 2020-05-18 LAB — COMPREHENSIVE METABOLIC PANEL
ALT: 14 U/L (ref 0–44)
AST: 20 U/L (ref 15–41)
Albumin: 3.9 g/dL (ref 3.5–5.0)
Alkaline Phosphatase: 95 U/L (ref 38–126)
Anion gap: 10 (ref 5–15)
BUN: 24 mg/dL — ABNORMAL HIGH (ref 8–23)
CO2: 23 mmol/L (ref 22–32)
Calcium: 9.1 mg/dL (ref 8.9–10.3)
Chloride: 103 mmol/L (ref 98–111)
Creatinine, Ser: 1.46 mg/dL — ABNORMAL HIGH (ref 0.61–1.24)
GFR calc Af Amer: 51 mL/min — ABNORMAL LOW (ref >60)
GFR calc non Af Amer: 44 mL/min — ABNORMAL LOW (ref >60)
Glucose, Bld: 97 mg/dL (ref 70–99)
Potassium: 4.3 mmol/L (ref 3.5–5.1)
Sodium: 136 mmol/L (ref 135–145)
Total Bilirubin: 0.3 mg/dL (ref 0.3–1.2)
Total Protein: 7.2 g/dL (ref 6.5–8.1)

## 2020-05-18 LAB — FOLATE: Folate: 51.6 ng/mL (ref >5.9)

## 2020-05-18 LAB — LACTATE DEHYDROGENASE: LDH: 132 U/L (ref 98–192)

## 2020-05-18 LAB — FERRITIN: Ferritin: 44 ng/mL (ref 24–336)

## 2020-05-18 LAB — VITAMIN B12: Vitamin B-12: 1567 pg/mL — ABNORMAL HIGH (ref 180–914)

## 2020-05-18 LAB — VITAMIN D 25 HYDROXY (VIT D DEFICIENCY, FRACTURES): Vit D, 25-Hydroxy: 46.54 ng/mL (ref 30–100)

## 2020-05-25 ENCOUNTER — Inpatient Hospital Stay (HOSPITAL_BASED_OUTPATIENT_CLINIC_OR_DEPARTMENT_OTHER): Payer: PPO | Admitting: Nurse Practitioner

## 2020-05-25 DIAGNOSIS — D500 Iron deficiency anemia secondary to blood loss (chronic): Secondary | ICD-10-CM | POA: Diagnosis not present

## 2020-05-25 NOTE — Assessment & Plan Note (Signed)
1.  Iron deficiency anemia: -Likely from combination of chronic GI blood loss and malabsorption. 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  Normal duodenal bulb and second part of duodenum. -Colonoscopy on 06/24/2018 showed internal hemorrhoids, few medium mouth diverticula in the entire colon.  Subcentimeter polyp in the sigmoid colon. -Bone marrow biopsy on 08/21/2018 showed normocellular marrow with erythroid hyperplasia.  No increase in plasma cells. -Denies any bright red bleeding per rectum or melena. -He is taking aspirin 81 mg daily and iron tablet daily.  According to the wife he reportedly had a stroke and was placed on aspirin. -Last Feraheme was on 03/09/2020 and 03/16/2020 -Labs done on 05/18/2020 showed hemoglobin 10.7, ferritin 44, percent saturation 11 -We will hold off on iron infusions at this time -We will see him back in 3 months with repeat labs.  If he calls back sooner with increased fatigue we will recheck labs and see him sooner.

## 2020-05-25 NOTE — Progress Notes (Signed)
[place] [place], [place] [address]   CLINIC:  Medical Oncology/Hematology  PCP:  Perlie Mayo, NP [place] [address] [phone]   REASON FOR VISIT: Follow-up for iron deficiency anemia   CURRENT THERAPY: Intermittent iron infusions   INTERVAL HISTORY:  Jared Schmidt 84 y.o. male returns for routine follow-up for iron deficiency anemia.  Patient reports he has been doing well since his last visit.  Denies any bright red bleeding per rectum or melena.  Denies any easy bruising or bleeding.  He reports his fatigue levels are about the same. Denies any nausea, vomiting, or diarrhea. Denies any new pains. Had not noticed any recent bleeding such as epistaxis, hematuria or hematochezia. Denies recent chest pain on exertion, shortness of breath on minimal exertion, pre-syncopal episodes, or palpitations. Denies any numbness or tingling in hands or feet. Denies any recent fevers, infections, or recent hospitalizations. Patient reports appetite at 100% and energy level at 50%.  He is eating well maintain his weight at this time.     REVIEW OF SYSTEMS:  Review of Systems  All other systems reviewed and are negative.    PAST MEDICAL/SURGICAL HISTORY:  Past Medical History:  Diagnosis Date  . Anemia   . Arthritis   . GERD (gastroesophageal reflux disease)   . Hypercholesteremia   . Hypertension   . Iron deficiency anemia due to chronic blood loss 03/17/2018  . Iron deficiency anemia due to chronic blood loss 03/17/2018  . Stroke ([place])   . Symptomatic anemia 01/20/2018  . Vitamin B deficiency    patient denies   Past Surgical History:  Procedure Laterality Date  . BIOPSY  06/24/2018   Procedure: BIOPSY;  Surgeon: Daneil Dolin, MD;  [facility];  Service: Endoscopy;;  gastric   . COLONOSCOPY N/A 06/24/2018   Dr. Gala Romney: Internal hemorrhoids, diverticulosis, 9 mm polyp removed from the sigmoid colon which was  hyperplastic.  Marland Kitchen ESOPHAGOGASTRODUODENOSCOPY N/A 06/24/2018   Dr. Gala Romney: Multiple erosions in the stomach and duodenal bulb, gastric biopsies with chronic gastritis with intestinal metaplasia, no H. pylori.  . ESOPHAGOGASTRODUODENOSCOPY (EGD) WITH PROPOFOL N/A 09/17/2018   Procedure: ESOPHAGOGASTRODUODENOSCOPY (EGD) WITH PROPOFOL;  Surgeon: Daneil Dolin, MD;  [facility];  Service: Endoscopy;  Laterality: N/A;  11:00am  . GIVENS CAPSULE STUDY N/A 09/15/2018   Procedure: GIVENS CAPSULE STUDY;  Surgeon: Daneil Dolin, MD;  [facility];  Service: Endoscopy;  Laterality: N/A;  7:30am  . none    . POLYPECTOMY  06/24/2018   Procedure: POLYPECTOMY;  Surgeon: Daneil Dolin, MD;  [facility];  Service: Endoscopy;;  sigmoid polyp hs     SOCIAL HISTORY:  Social History   Socioeconomic History  . Marital status: Married    Spouse name: Terryn Redner  . Number of children: Not on file  . Years of education: Not on file  . Highest education level: 8th grade  Occupational History  . Occupation: retired  Tobacco Use  . Smoking status: Former Smoker    Packs/day: 0.50    Years: 67.00    Pack years: 33.50    Types: Cigarettes    Quit date: 12/11/2017    Years since quitting: 2.4  . Smokeless tobacco: Never Used  Vaping Use  . Vaping Use: Never used  Substance and Sexual Activity  . Alcohol use: No  . Drug use: No  . Sexual activity: Not Currently  Other Topics Concern  . Not on file  Social History Narrative   Lives with wife [name] in [place]   Dog named poppet      Enjoys hanging around [employer]: eats all food groups   Caffeine: coffee   Water: 4-5 cups daily      Wears seat belt    Does not use phone while driving    Oceanographer at home    Social Determinants of Health   Financial Resource Strain: Low Risk   . Difficulty of Paying Living Expenses: Not hard at all  Food Insecurity: No Food Insecurity  . Worried About Paediatric nurse in the Last Year: Never true  . Ran Out of Food in the Last Year: Never true  Transportation Needs: No Transportation Needs  . Lack of Transportation (Medical): No  . Lack of Transportation (Non-Medical): No  Physical Activity:   . Days of Exercise per Week:   . Minutes of Exercise per Session:   Stress:   . Feeling of Stress :   Social Connections: Socially Isolated  . Frequency of Communication with Friends and Family: Never  . Frequency of Social Gatherings with Friends and Family: Never  . Attends Religious Services: Never  . Active Member of Clubs or Organizations: No  . Attends Archivist Meetings: Never  . Marital Status: Married  Human resources officer Violence: Not At Risk  . Fear of Current or Ex-Partner: No  . Emotionally Abused: No  . Physically Abused: No  . Sexually Abused: No    FAMILY HISTORY:  Family History  Problem Relation Age of Onset  . Colon cancer Neg Hx     CURRENT MEDICATIONS:  Outpatient Encounter Medications as of 05/25/2020  Medication Sig  . alfuzosin (UROXATRAL) 10 MG 24 hr tablet Take 10 mg by mouth at bedtime.  Marland Kitchen amLODipine (NORVASC) 5 MG tablet Take 1 tablet (5 mg total) by mouth daily.  Marland Kitchen aspirin EC 81 MG tablet Take 81 mg by mouth daily.  Marland Kitchen atorvastatin (LIPITOR) 10 MG tablet Take 1 tablet (10 mg total) by mouth daily with supper.  . carvedilol (COREG) 12.5 MG tablet Take 1 tablet (12.5 mg total) by mouth 2 (two) times daily with a meal.  . cholecalciferol (VITAMIN D3) 25 MCG (1000 UNIT) tablet Take 1,000 Units by mouth daily.  . clotrimazole-betamethasone (LOTRISONE) cream Apply 1 application topically 2 (two) times daily.  . Ferrous Sulfate (IRON) 325 (65 Fe) MG TABS Take 1 tablet (325 mg total) by mouth daily.  . Multiple Vitamins-Minerals (CENTRUM SILVER PO) Take 1 tablet by mouth daily.  . pantoprazole (PROTONIX) 40 MG tablet Take 1 tablet (40 mg total) by mouth daily.  . silodosin (RAPAFLO) 8 MG CAPS capsule Take 1  capsule (8 mg total) by mouth daily with breakfast.  . vitamin C (ASCORBIC ACID) 500 MG tablet Take 500 mg by mouth daily.  . vitamin E 100 UNIT capsule Take 100 Units by mouth daily.    No facility-administered encounter medications on file as of 05/25/2020.    ALLERGIES:  No Known Allergies   PHYSICAL EXAM:  ECOG Performance status: 1  Vitals:   05/25/20 1417  BP: (!) 149/76  Pulse: 67  Resp: 18  Temp: 98.5 F (36.9 C)  SpO2: 100%   Filed Weights   05/25/20 1417  Weight: 145 lb 4.5 oz (65.9 kg)   Physical Exam Constitutional:      Appearance: Normal  appearance. He is normal weight.  Cardiovascular:     Rate and Rhythm: Normal rate and regular rhythm.     Heart sounds: Normal heart sounds.  Pulmonary:     Effort: Pulmonary effort is normal.     Breath sounds: Normal breath sounds.  Abdominal:     General: Bowel sounds are normal.     Palpations: Abdomen is soft.  Musculoskeletal:        General: Normal range of motion.  Skin:    General: Skin is warm.  Neurological:     Mental Status: He is alert and oriented to person, place, and time. Mental status is at baseline.  Psychiatric:        Mood and Affect: Mood normal.        Behavior: Behavior normal.        Thought Content: Thought content normal.        Judgment: Judgment normal.      LABORATORY DATA:  I have reviewed the labs as listed.  CBC    Component Value Date/Time   WBC 6.3 05/18/2020 1414   RBC 4.27 05/18/2020 1414   HGB 10.7 (L) 05/18/2020 1414   HCT 36.0 (L) 05/18/2020 1414   PLT 383 05/18/2020 1414   MCV 84.3 05/18/2020 1414   MCH 25.1 (L) 05/18/2020 1414   MCHC 29.7 (L) 05/18/2020 1414   RDW 18.0 (H) 05/18/2020 1414   LYMPHSABS 0.6 (L) 05/18/2020 1414   MONOABS 0.9 05/18/2020 1414   EOSABS 0.2 05/18/2020 1414   BASOSABS 0.1 05/18/2020 1414   CMP Latest Ref Rng & Units 05/18/2020 02/16/2020 10/18/2019  Glucose 70 - 99 mg/dL 97 103(H) 97  BUN 8 - 23 mg/dL 24(H) 18 17  Creatinine 0.61  - 1.24 mg/dL 1.46(H) 1.11 1.30(H)  Sodium 135 - 145 mmol/L 136 140 140  Potassium 3.5 - 5.1 mmol/L 4.3 4.2 4.3  Chloride 98 - 111 mmol/L 103 107 107  CO2 22 - 32 mmol/L _0 Calcium 8.9 - 10.3 mg/dL 9.1 9.5 9.3  Total Protein 6.5 - 8.1 g/dL 7.2 6.9 7.0  Total Bilirubin 0.3 - 1.2 mg/dL 0.3 0.4 0.6  Alkaline Phos 38 - 126 U/L 95 103 83  AST 15 - 41 U/L _1 ALT 0 - 44 U/L _2 All questions were answered to patient's stated satisfaction. Encouraged patient to call with any new concerns or questions before his next visit to the cancer center and we can certain see him sooner, if needed.     ASSESSMENT & PLAN:  Iron deficiency anemia due to chronic blood loss 1.  Iron deficiency anemia: -Likely from combination of chronic GI blood loss and malabsorption. 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  Normal duodenal bulb and second part of duodenum. -Colonoscopy on 06/24/2018 showed internal hemorrhoids, few medium mouth diverticula in the entire colon.  Subcentimeter polyp in the sigmoid colon. -Bone marrow biopsy on 08/21/2018 showed normocellular marrow with erythroid hyperplasia.  No increase in plasma cells. -Denies any bright red bleeding per rectum or melena. -He is taking aspirin 81 mg daily and iron tablet daily.  According to the wife he reportedly had a stroke and was placed on aspirin. -Last Feraheme was on 03/09/2020 and 03/16/2020 -Labs done on 05/18/2020 showed hemoglobin 10.7, ferritin 44, percent saturation 11 -We will hold off on iron infusions at this time -We  will see him back in 3 months with repeat labs.  If he calls back sooner with increased fatigue we will recheck labs and see him sooner.     Orders placed this encounter:  Orders Placed This Encounter  Procedures  . Lactate dehydrogenase  . CBC with Differential/Platelet  . Comprehensive metabolic panel  .  Ferritin  . Iron and TIBC  . Vitamin B12  . VITAMIN D 25 Hydroxy (Vit-D Deficiency, Fractures)  . Folate      Francene Finders, FNP-C [place] [phone]

## 2020-05-25 NOTE — Patient Instructions (Signed)
[place] at [facility] Discharge Instructions  Follow up in 4 months with labs    Thank you for choosing [place] at [facility] to provide your oncology and hematology care.  To afford each patient quality time with our provider, please arrive at least 15 minutes before your scheduled appointment time.   If you have a lab appointment with the [facility] please come in thru the Main Entrance and check in at the main information desk.  You need to re-schedule your appointment should you arrive 10 or more minutes late.  We strive to give you quality time with our providers, and arriving late affects you and other patients whose appointments are after yours.  Also, if you no show three or more times for appointments you may be dismissed from the clinic at the providers discretion.     Again, thank you for choosing [facility].  Our hope is that these requests will decrease the amount of time that you wait before being seen by our physicians.       _____________________________________________________________  Should you have questions after your visit to [facility], please contact our office at [phone] and follow the prompts.  Our office hours are 8:00 a.m. and 4:30 p.m. Monday - Friday.  Please note that voicemails left after 4:00 p.m. may not be returned until the following business day.  We are closed weekends and major holidays.  You do have access to a nurse 24-7, just call the main number to the clinic [phone] and do not press any options, hold on the line and a nurse will answer the phone.    For prescription refill requests, have your pharmacy contact our office and allow 72 hours.    Due to Covid, you will need to wear a mask upon entering the hospital. If you do not have a mask, a mask will be given to you at the Main Entrance upon arrival. For doctor visits, patients may have 1 support person age 56  or older with them. For treatment visits, patients can not have anyone with them due to social distancing guidelines and our immunocompromised population.

## 2020-05-31 ENCOUNTER — Ambulatory Visit (INDEPENDENT_AMBULATORY_CARE_PROVIDER_SITE_OTHER): Payer: PPO | Admitting: Family Medicine

## 2020-05-31 ENCOUNTER — Other Ambulatory Visit: Payer: Self-pay

## 2020-05-31 ENCOUNTER — Encounter: Payer: Self-pay | Admitting: Family Medicine

## 2020-05-31 VITALS — BP 125/69 | HR 84 | Temp 97.6°F | Resp 16 | Ht 67.0 in | Wt 143.4 lb

## 2020-05-31 DIAGNOSIS — I10 Essential (primary) hypertension: Secondary | ICD-10-CM

## 2020-05-31 DIAGNOSIS — R21 Rash and other nonspecific skin eruption: Secondary | ICD-10-CM | POA: Diagnosis not present

## 2020-05-31 MED ORDER — NYSTATIN-TRIAMCINOLONE 100000-0.1 UNIT/GM-% EX OINT: 1.0000 "application " | TOPICAL_OINTMENT | Freq: Two times a day (BID) | CUTANEOUS | 0 refills | Status: DC

## 2020-05-31 NOTE — Patient Instructions (Signed)
I appreciate the opportunity to provide you with care for your health and wellness. Today we discussed: rash   Follow up: 3 months   No labs or referrals today  Please use ointment twice daily for 7-10 days, if no improvement or if rash worsens, call me back.  Please continue to practice social distancing to keep you, your family, and our community safe.  If you must go out, please wear a mask and practice good handwashing.  It was a pleasure to see you and I look forward to continuing to work together on your health and well-being. Please do not hesitate to call the office if you need care or have questions about your care.  Have a wonderful day and week. With Gratitude, Cherly Beach, DNP, AGNP-BC

## 2020-05-31 NOTE — Progress Notes (Signed)
n       Subjective:  Patient ID: Jared Schmidt, male    DOB: Apr 01, 1936  Age: 84 y.o. MRN: [id number]  CC:  Chief Complaint  Patient presents with  . Follow-up    no other issues just check up       HPI  HPI  Jared Schmidt is a 84 year old male patient of mine. Presents today for follow-up. Denies having any issues or concerns except for a rash that is going around the neck and a little bit into the underarm. He denies having any excessive sweating or heating. Denies any changes in soaps, laundry soap or creams. No changes in food products either. Denies having any sleep issues. Denies have any changes in chewing swallowing or appetite. Denies having any blood in urine or stool no changes in bowel or bladder habits. Denies having any memory issues. Denies having any falls or injuries. Denies having any other skin issues outside of what was stated above. Denies having any hearing or vision changes.  Today patient denies signs and symptoms of COVID 19 infection including fever, chills, cough, shortness of breath, and headache. Past Medical, Surgical, Social History, Allergies, and Medications have been Reviewed.   Past Medical History:  Diagnosis Date  . Anemia   . Arthritis   . GERD (gastroesophageal reflux disease)   . Hypercholesteremia   . Hypertension   . Iron deficiency anemia due to chronic blood loss 03/17/2018  . Iron deficiency anemia due to chronic blood loss 03/17/2018  . Stroke ([place])   . Symptomatic anemia 01/20/2018  . Vitamin B deficiency    patient denies    Current Meds  Medication Sig  . alfuzosin (UROXATRAL) 10 MG 24 hr tablet Take 10 mg by mouth at bedtime.  Marland Kitchen amLODipine (NORVASC) 5 MG tablet Take 1 tablet (5 mg total) by mouth daily.  Marland Kitchen aspirin EC 81 MG tablet Take 81 mg by mouth daily.  Marland Kitchen atorvastatin (LIPITOR) 10 MG tablet Take 1 tablet (10 mg total) by mouth daily with supper.  . carvedilol (COREG) 12.5 MG tablet Take 1 tablet (12.5 mg total) by mouth 2  (two) times daily with a meal.  . cholecalciferol (VITAMIN D3) 25 MCG (1000 UNIT) tablet Take 1,000 Units by mouth daily.  . clotrimazole-betamethasone (LOTRISONE) cream Apply 1 application topically 2 (two) times daily.  . Ferrous Sulfate (IRON) 325 (65 Fe) MG TABS Take 1 tablet (325 mg total) by mouth daily.  . Multiple Vitamins-Minerals (CENTRUM SILVER PO) Take 1 tablet by mouth daily.  . pantoprazole (PROTONIX) 40 MG tablet Take 1 tablet (40 mg total) by mouth daily.  . silodosin (RAPAFLO) 8 MG CAPS capsule Take 1 capsule (8 mg total) by mouth daily with breakfast.  . vitamin C (ASCORBIC ACID) 500 MG tablet Take 500 mg by mouth daily.  . vitamin E 100 UNIT capsule Take 100 Units by mouth daily.     ROS:  Review of Systems  Constitutional: Negative.   HENT: Negative.   Eyes: Negative.   Respiratory: Negative.   Cardiovascular: Negative.   Gastrointestinal: Negative.   Genitourinary: Negative.   Musculoskeletal: Negative.   Skin: Positive for rash.  Neurological: Negative.   Endo/Heme/Allergies: Negative.   Psychiatric/Behavioral: Negative.   All other systems reviewed and are negative.    Objective:   Today's Vitals: BP 125/69 (BP Location: Left Arm, Patient Position: Sitting, Cuff Size: Normal)   Pulse 84   Temp 97.6 F (36.4 C) (Temporal)   Resp 16  Ht 5\' 7"  (1.702 m)   Wt 143 lb 6.4 oz (65 kg)   SpO2 99%   BMI 22.46 kg/m  Vitals with BMI 05/31/2020 05/25/2020 03/16/2020  Height 5\' 7"  - -  Weight 143 lbs 6 oz 145 lbs 5 oz -  BMI 85.92 - -  Systolic 924 462 863  Diastolic 69 76 67  Pulse 84 67 70     Physical Exam Vitals and nursing note reviewed.  Constitutional:      Appearance: Normal appearance. He is well-developed, well-groomed and normal weight.  HENT:     Head: Normocephalic and atraumatic.     Right Ear: External ear normal.     Left Ear: External ear normal.     Mouth/Throat:     Comments: Mask in place  Eyes:     General:        Right eye: No  discharge.        Left eye: No discharge.     Conjunctiva/sclera: Conjunctivae normal.  Cardiovascular:     Rate and Rhythm: Normal rate and regular rhythm.     Pulses: Normal pulses.     Heart sounds: Normal heart sounds.  Pulmonary:     Effort: Pulmonary effort is normal.     Breath sounds: Normal breath sounds.  Musculoskeletal:        General: Normal range of motion.     Cervical back: Normal range of motion and neck supple.  Skin:    General: Skin is warm.     Findings: Erythema and rash present.     Comments: Around neck   Neurological:     General: No focal deficit present.     Mental Status: He is alert and oriented to person, place, and time.  Psychiatric:        Attention and Perception: Attention normal.        Mood and Affect: Mood normal.        Speech: Speech normal.        Behavior: Behavior normal. Behavior is cooperative.        Thought Content: Thought content normal.        Cognition and Memory: Cognition normal.        Judgment: Judgment normal.     Assessment   1. Rash   2. Essential hypertension     Tests ordered No orders of the defined types were placed in this encounter.    Plan: Please see assessment and plan per problem list above.   Meds ordered this encounter  Medications  . nystatin-triamcinolone ointment (MYCOLOG)    Sig: Apply 1 application topically 2 (two) times daily.    Dispense:  30 g    Refill:  0    Order Specific Question:   Supervising Provider    Answer:   Fayrene Helper [[id number]]    Patient to follow-up in 3 months  Perlie Mayo, NP

## 2020-05-31 NOTE — Assessment & Plan Note (Signed)
Jared Schmidt is encouraged to maintain a well balanced diet that is low in salt. Controlled, continue current medication regimen.  Additionally, he is also reminded that exercise is beneficial for heart health and control of  Blood pressure. 30-60 minutes daily is recommended-walking was suggested.

## 2020-06-07 ENCOUNTER — Ambulatory Visit: Payer: PPO | Admitting: Urology

## 2020-06-15 ENCOUNTER — Ambulatory Visit: Payer: PPO | Admitting: Family Medicine

## 2020-06-19 ENCOUNTER — Telehealth: Payer: Self-pay

## 2020-06-19 ENCOUNTER — Other Ambulatory Visit: Payer: Self-pay

## 2020-06-19 DIAGNOSIS — D500 Iron deficiency anemia secondary to blood loss (chronic): Secondary | ICD-10-CM

## 2020-06-19 MED ORDER — IRON 325 (65 FE) MG PO TABS: 1.0000 | ORAL_TABLET | Freq: Every day | ORAL | 5 refills | Status: DC

## 2020-06-19 NOTE — Telephone Encounter (Signed)
Pt needs a refill on iron medication.

## 2020-06-19 NOTE — Telephone Encounter (Signed)
Medication refilled

## 2020-07-11 ENCOUNTER — Telehealth: Payer: Self-pay | Admitting: Family Medicine

## 2020-07-11 ENCOUNTER — Other Ambulatory Visit: Payer: Self-pay | Admitting: *Deleted

## 2020-07-11 MED ORDER — CARVEDILOL 12.5 MG PO TABS: 12.5000 mg | ORAL_TABLET | Freq: Two times a day (BID) | ORAL | 1 refills | Status: DC

## 2020-07-11 NOTE — Telephone Encounter (Signed)
Pt medication sent to pharmacy  

## 2020-07-11 NOTE — Telephone Encounter (Signed)
Patient called needing a refill on carvedilol please send to [facility] patient callback is [phone]

## 2020-07-17 ENCOUNTER — Other Ambulatory Visit: Payer: Self-pay

## 2020-07-17 ENCOUNTER — Ambulatory Visit (INDEPENDENT_AMBULATORY_CARE_PROVIDER_SITE_OTHER): Payer: PPO | Admitting: Urology

## 2020-07-17 ENCOUNTER — Encounter: Payer: Self-pay | Admitting: Urology

## 2020-07-17 VITALS — BP 137/72 | HR 91 | Temp 97.9°F | Ht 67.0 in | Wt 143.4 lb

## 2020-07-17 DIAGNOSIS — R351 Nocturia: Secondary | ICD-10-CM | POA: Diagnosis not present

## 2020-07-17 DIAGNOSIS — N401 Enlarged prostate with lower urinary tract symptoms: Secondary | ICD-10-CM | POA: Diagnosis not present

## 2020-07-17 DIAGNOSIS — N138 Other obstructive and reflux uropathy: Secondary | ICD-10-CM | POA: Insufficient documentation

## 2020-07-17 LAB — URINALYSIS, ROUTINE W REFLEX MICROSCOPIC
Bilirubin, UA: NEGATIVE
Glucose, UA: NEGATIVE
Ketones, UA: NEGATIVE
Nitrite, UA: NEGATIVE
Specific Gravity, UA: 1.02 (ref 1.005–1.030)
Urobilinogen, Ur: 0.2 mg/dL (ref 0.2–1.0)
pH, UA: 7.5 (ref 5.0–7.5)

## 2020-07-17 LAB — MICROSCOPIC EXAMINATION
Renal Epithel, UA: NONE SEEN /hpf
WBC, UA: >30 /hpf — AB (ref 0–5)

## 2020-07-17 LAB — BLADDER SCAN AMB NON-IMAGING: Scan Result: 0

## 2020-07-17 MED ORDER — TAMSULOSIN HCL 0.4 MG PO CAPS: 0.4000 mg | ORAL_CAPSULE | Freq: Every day | ORAL | 11 refills | Status: DC

## 2020-07-17 NOTE — Progress Notes (Signed)
07/17/2020 2:14 PM   Jared Schmidt [birth year] [id number]  Referring provider: Perlie Mayo, NP [address] [place],  [place] [address]  nocturia  HPI: Jared Schmidt is a 84yo here for followup for BPH with nocturia. Since last visit he started rapaflo 8mg  which worked well but was expensive. He has nocturia 1x. Stream is fair. Urinary urgency/frequency is improved. He is happy with his urination   PMH: Past Medical History:  Diagnosis Date  . Anemia   . Arthritis   . GERD (gastroesophageal reflux disease)   . Hypercholesteremia   . Hypertension   . Iron deficiency anemia due to chronic blood loss 03/17/2018  . Iron deficiency anemia due to chronic blood loss 03/17/2018  . Stroke ([facility])   . Symptomatic anemia 01/20/2018  . Vitamin B deficiency    patient denies    Surgical History: Past Surgical History:  Procedure Laterality Date  . BIOPSY  06/24/2018   Procedure: BIOPSY;  Surgeon: Daneil Dolin, MD;  [facility];  Service: Endoscopy;;  gastric   . COLONOSCOPY N/A 06/24/2018   Dr. Gala Romney: Internal hemorrhoids, diverticulosis, 9 mm polyp removed from the sigmoid colon which was hyperplastic.  Marland Kitchen ESOPHAGOGASTRODUODENOSCOPY N/A 06/24/2018   Dr. Gala Romney: Multiple erosions in the stomach and duodenal bulb, gastric biopsies with chronic gastritis with intestinal metaplasia, no H. pylori.  . ESOPHAGOGASTRODUODENOSCOPY (EGD) WITH PROPOFOL N/A 09/17/2018   Procedure: ESOPHAGOGASTRODUODENOSCOPY (EGD) WITH PROPOFOL;  Surgeon: Daneil Dolin, MD;  [facility];  Service: Endoscopy;  Laterality: N/A;  11:00am  . GIVENS CAPSULE STUDY N/A 09/15/2018   Procedure: GIVENS CAPSULE STUDY;  Surgeon: Daneil Dolin, MD;  [facility];  Service: Endoscopy;  Laterality: N/A;  7:30am  . none    . POLYPECTOMY  06/24/2018   Procedure: POLYPECTOMY;  Surgeon: Daneil Dolin, MD;  [facility];  Service: Endoscopy;;  sigmoid polyp hs    Home Medications:    Allergies as of 07/17/2020   No Known Allergies     Medication List       Accurate as of July 17, 2020  2:14 PM. If you have any questions, ask your nurse or doctor.        alfuzosin 10 MG 24 hr tablet Commonly known as: UROXATRAL Take 10 mg by mouth at bedtime.   amLODipine 5 MG tablet Commonly known as: NORVASC Take 1 tablet (5 mg total) by mouth daily.   aspirin EC 81 MG tablet Take 81 mg by mouth daily.   atorvastatin 10 MG tablet Commonly known as: LIPITOR Take 1 tablet (10 mg total) by mouth daily with supper.   carvedilol 12.5 MG tablet Commonly known as: COREG Take 1 tablet (12.5 mg total) by mouth 2 (two) times daily with a meal.   CENTRUM SILVER PO Take 1 tablet by mouth daily.   cholecalciferol 25 MCG (1000 UNIT) tablet Commonly known as: VITAMIN D3 Take 1,000 Units by mouth daily.   clotrimazole-betamethasone cream Commonly known as: LOTRISONE Apply 1 application topically 2 (two) times daily.   Iron 325 (65 Fe) MG Tabs Take 1 tablet (325 mg total) by mouth daily.   nystatin-triamcinolone ointment Commonly known as: MYCOLOG Apply 1 application topically 2 (two) times daily.   nystatin-triamcinolone cream Commonly known as: MYCOLOG II Apply topically 2 (two) times daily.   pantoprazole 40 MG tablet Commonly known as: PROTONIX Take 1 tablet (40 mg total) by mouth daily.   silodosin 8  MG Caps capsule Commonly known as: RAPAFLO Take 1 capsule (8 mg total) by mouth daily with breakfast.   vitamin C 500 MG tablet Commonly known as: ASCORBIC ACID Take 500 mg by mouth daily.   vitamin E 45 MG (100 UNITS) capsule Take 100 Units by mouth daily.       Allergies: No Known Allergies  Family History: Family History  Problem Relation Age of Onset  . Colon cancer Neg Hx     Social History:  reports that he quit smoking about 2 years ago. His smoking use included cigarettes. He has a 33.50 pack-year smoking history. He has never used  smokeless tobacco. He reports that he does not drink alcohol and does not use drugs.  ROS: All other review of systems were reviewed and are negative except what is noted above in HPI  Physical Exam: BP 137/72   Pulse 91   Temp 97.9 F (36.6 C)   Ht 5\' 7"  (1.702 m)   Wt 143 lb 6.4 oz (65 kg)   BMI 22.46 kg/m   Constitutional:  Alert and oriented, No acute distress. HEENT: [place] AT, moist mucus membranes.  Trachea midline, no masses. Cardiovascular: No clubbing, cyanosis, or edema. Respiratory: Normal respiratory effort, no increased work of breathing. GI: Abdomen is soft, nontender, nondistended, no abdominal masses GU: No CVA tenderness.  Lymph: No cervical or inguinal lymphadenopathy. Skin: No rashes, bruises or suspicious lesions. Neurologic: Grossly intact, no focal deficits, moving all 4 extremities. Psychiatric: Normal mood and affect.  Laboratory Data: Lab Results  Component Value Date   WBC 6.3 05/18/2020   HGB 10.7 (L) 05/18/2020   HCT 36.0 (L) 05/18/2020   MCV 84.3 05/18/2020   PLT 383 05/18/2020    Lab Results  Component Value Date   CREATININE 1.46 (H) 05/18/2020    No results found for: PSA  No results found for: TESTOSTERONE  No results found for: HGBA1C  Urinalysis    Component Value Date/Time   COLORURINE YELLOW 08/01/2018 1338   APPEARANCEUR HAZY (A) 08/01/2018 1338   LABSPEC 1.017 08/01/2018 1338   PHURINE 5.0 08/01/2018 1338   GLUCOSEU NEGATIVE 08/01/2018 [place] 08/01/2018 1338   BILIRUBINUR NEGATIVE 08/01/2018 1338   KETONESUR NEGATIVE 08/01/2018 1338   PROTEINUR NEGATIVE 08/01/2018 1338   NITRITE POSITIVE (A) 08/01/2018 1338   LEUKOCYTESUR LARGE (A) 08/01/2018 1338    Lab Results  Component Value Date   BACTERIA MANY (A) 08/01/2018    Pertinent Imaging:  No results found for this or any previous visit.  No results found for this or any previous visit.  No results found for this or any previous visit.  No  results found for this or any previous visit.  No results found for this or any previous visit.  No results found for this or any previous visit.  No results found for this or any previous visit.  No results found for this or any previous visit.   Assessment & Plan:    1. Nocturia -we will switch to flomax - Urinalysis, Routine w reflex microscopic - BLADDER SCAN AMB NON-IMAGING  2. BPh with LUTS: --flomax 0.4mg     No follow-ups on file.  Nicolette Bang, MD  [facility] [place]

## 2020-07-17 NOTE — Patient Instructions (Signed)

## 2020-07-17 NOTE — Progress Notes (Signed)
PVR = 0    Urological Symptom Review  Patient is experiencing the following symptoms: none   Review of Systems  Gastrointestinal (upper)  : Negative for upper GI symptoms  Gastrointestinal (lower) : Negative for lower GI symptoms  Constitutional : Negative for symptoms  Skin: Negative for skin symptoms  Eyes: Negative for eye symptoms  Ear/Nose/Throat : Negative for Ear/Nose/Throat symptoms  Hematologic/Lymphatic: Negative for Hematologic/Lymphatic symptoms  Cardiovascular : Negative for cardiovascular symptoms  Respiratory : Negative for respiratory symptoms  Endocrine: Negative for endocrine symptoms  Musculoskeletal: Negative for musculoskeletal symptoms  Neurological: Negative for neurological symptoms  Psychologic: Negative for psychiatric symptoms

## 2020-08-12 ENCOUNTER — Other Ambulatory Visit: Payer: Self-pay | Admitting: Family Medicine

## 2020-08-13 ENCOUNTER — Other Ambulatory Visit: Payer: Self-pay | Admitting: Family Medicine

## 2020-08-17 ENCOUNTER — Other Ambulatory Visit (HOSPITAL_COMMUNITY): Payer: Self-pay

## 2020-08-17 DIAGNOSIS — D472 Monoclonal gammopathy: Secondary | ICD-10-CM

## 2020-08-17 DIAGNOSIS — D500 Iron deficiency anemia secondary to blood loss (chronic): Secondary | ICD-10-CM

## 2020-08-17 DIAGNOSIS — D508 Other iron deficiency anemias: Secondary | ICD-10-CM

## 2020-08-17 DIAGNOSIS — E539 Vitamin B deficiency, unspecified: Secondary | ICD-10-CM

## 2020-08-18 ENCOUNTER — Other Ambulatory Visit: Payer: Self-pay

## 2020-08-18 ENCOUNTER — Inpatient Hospital Stay (HOSPITAL_COMMUNITY): Payer: PPO | Attending: Hematology

## 2020-08-18 DIAGNOSIS — D509 Iron deficiency anemia, unspecified: Secondary | ICD-10-CM | POA: Diagnosis not present

## 2020-08-18 DIAGNOSIS — K219 Gastro-esophageal reflux disease without esophagitis: Secondary | ICD-10-CM | POA: Insufficient documentation

## 2020-08-18 DIAGNOSIS — I10 Essential (primary) hypertension: Secondary | ICD-10-CM | POA: Diagnosis not present

## 2020-08-18 DIAGNOSIS — E7800 Pure hypercholesterolemia, unspecified: Secondary | ICD-10-CM | POA: Diagnosis not present

## 2020-08-18 DIAGNOSIS — E539 Vitamin B deficiency, unspecified: Secondary | ICD-10-CM | POA: Insufficient documentation

## 2020-08-18 DIAGNOSIS — D508 Other iron deficiency anemias: Secondary | ICD-10-CM

## 2020-08-18 DIAGNOSIS — D472 Monoclonal gammopathy: Secondary | ICD-10-CM

## 2020-08-18 DIAGNOSIS — Z8673 Personal history of transient ischemic attack (TIA), and cerebral infarction without residual deficits: Secondary | ICD-10-CM | POA: Diagnosis not present

## 2020-08-18 DIAGNOSIS — R5383 Other fatigue: Secondary | ICD-10-CM | POA: Diagnosis not present

## 2020-08-18 DIAGNOSIS — Z79899 Other long term (current) drug therapy: Secondary | ICD-10-CM | POA: Insufficient documentation

## 2020-08-18 DIAGNOSIS — D500 Iron deficiency anemia secondary to blood loss (chronic): Secondary | ICD-10-CM

## 2020-08-18 LAB — CBC WITH DIFFERENTIAL/PLATELET
Basophils Absolute: 0 10*3/uL (ref 0.0–0.1)
Basophils Relative: 0 %
Eosinophils Absolute: 0.1 10*3/uL (ref 0.0–0.5)
Eosinophils Relative: 1 %
HCT: 35.9 % — ABNORMAL LOW (ref 39.0–52.0)
Hemoglobin: 10.6 g/dL — ABNORMAL LOW (ref 13.0–17.0)
Lymphocytes Relative: 4 %
Lymphs Abs: 0.3 10*3/uL — ABNORMAL LOW (ref 0.7–4.0)
MCH: 24 pg — ABNORMAL LOW (ref 26.0–34.0)
MCHC: 29.5 g/dL — ABNORMAL LOW (ref 30.0–36.0)
MCV: 81.4 fL (ref 80.0–100.0)
Metamyelocytes Relative: 2 %
Monocytes Absolute: 0.7 10*3/uL (ref 0.1–1.0)
Monocytes Relative: 11 %
Myelocytes: 2 %
Neutro Abs: 5.4 10*3/uL (ref 1.7–7.7)
Neutrophils Relative %: 80 %
Platelets: 364 10*3/uL (ref 150–400)
RBC: 4.41 MIL/uL (ref 4.22–5.81)
RDW: 19.6 % — ABNORMAL HIGH (ref 11.5–15.5)
WBC: 6.7 10*3/uL (ref 4.0–10.5)
nRBC: 0 % (ref 0.0–0.2)

## 2020-08-18 LAB — COMPREHENSIVE METABOLIC PANEL
ALT: 15 U/L (ref 0–44)
AST: 23 U/L (ref 15–41)
Albumin: 3.7 g/dL (ref 3.5–5.0)
Alkaline Phosphatase: 85 U/L (ref 38–126)
Anion gap: 7 (ref 5–15)
BUN: 20 mg/dL (ref 8–23)
CO2: 26 mmol/L (ref 22–32)
Calcium: 9.4 mg/dL (ref 8.9–10.3)
Chloride: 105 mmol/L (ref 98–111)
Creatinine, Ser: 1.49 mg/dL — ABNORMAL HIGH (ref 0.61–1.24)
GFR, Estimated: 43 mL/min — ABNORMAL LOW (ref >60)
Glucose, Bld: 100 mg/dL — ABNORMAL HIGH (ref 70–99)
Potassium: 4.7 mmol/L (ref 3.5–5.1)
Sodium: 138 mmol/L (ref 135–145)
Total Bilirubin: 0.4 mg/dL (ref 0.3–1.2)
Total Protein: 7 g/dL (ref 6.5–8.1)

## 2020-08-18 LAB — LACTATE DEHYDROGENASE: LDH: 147 U/L (ref 98–192)

## 2020-08-18 LAB — VITAMIN D 25 HYDROXY (VIT D DEFICIENCY, FRACTURES): Vit D, 25-Hydroxy: 41.54 ng/mL (ref 30–100)

## 2020-08-18 LAB — IRON AND TIBC
Iron: 31 ug/dL — ABNORMAL LOW (ref 45–182)
Saturation Ratios: 9 % — ABNORMAL LOW (ref 17.9–39.5)
TIBC: 354 ug/dL (ref 250–450)
UIBC: 323 ug/dL

## 2020-08-18 LAB — VITAMIN B12: Vitamin B-12: 1169 pg/mL — ABNORMAL HIGH (ref 180–914)

## 2020-08-18 LAB — FOLATE: Folate: 64.6 ng/mL (ref >5.9)

## 2020-08-18 LAB — FERRITIN: Ferritin: 37 ng/mL (ref 24–336)

## 2020-08-22 ENCOUNTER — Other Ambulatory Visit: Payer: Self-pay

## 2020-08-22 ENCOUNTER — Inpatient Hospital Stay (HOSPITAL_BASED_OUTPATIENT_CLINIC_OR_DEPARTMENT_OTHER): Payer: PPO | Admitting: Oncology

## 2020-08-22 ENCOUNTER — Ambulatory Visit (HOSPITAL_COMMUNITY): Payer: PPO | Admitting: Oncology

## 2020-08-22 DIAGNOSIS — D500 Iron deficiency anemia secondary to blood loss (chronic): Secondary | ICD-10-CM

## 2020-08-22 DIAGNOSIS — D509 Iron deficiency anemia, unspecified: Secondary | ICD-10-CM | POA: Diagnosis not present

## 2020-08-22 NOTE — Assessment & Plan Note (Addendum)
1.  Iron deficiency anemia: -Likely from combination of chronic GI blood loss and malabsorption. 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  Normal duodenal bulb and second part of duodenum. -Colonoscopy on 06/24/2018 showed internal hemorrhoids, few medium mouth diverticula in the entire colon.  Subcentimeter polyp in the sigmoid colon. -Bone marrow biopsy on 08/21/2018 showed normocellular marrow with erythroid hyperplasia.  No increase in plasma cells. -Denies any bright red bleeding per rectum or melena. -He is taking aspirin 81 mg daily and iron tablet daily.  According to the wife he reportedly had a stroke and was placed on aspirin. -Last Feraheme was on 03/09/2020 and 03/16/2020 -Labs collected on 08/18/2020 shows a hemoglobin at 10.6 (10.7). iron saturations 9% (11%) and a ferritin of 37 (42).  -Goal ferritin is around 100. -Although he is relatively asymptomatic, would recommend 2 doses of IV Feraheme scheduled 1 week apart. -Continue oral iron as he is tolerating it well. -We will see him back in 5 months with repeat labs (CBC, Ferritin, iron).  If symptoms worsen patient is to call clinic to be seen sooner.

## 2020-08-22 NOTE — Progress Notes (Signed)
[place], [place] [address]   CLINIC:  Medical Oncology/Hematology  PCP:  Perlie Mayo, NP [place] [address] [phone]   REASON FOR VISIT: Follow-up for iron deficiency anemia   CURRENT THERAPY: Intermittent iron infusions   INTERVAL HISTORY:  Mr. Bunn 84 y.o. male returns for routine follow-up for iron deficiency anemia.  Mr. Toney Rakes reports feeling well since his last visit.  He received IV Feraheme back in May 2021.  Patient's wife states she feels he is more fatigued and his iron levels are trending down.  He denies any bright red blood per rectum or melena. Denies any easy bruising or bleeding.  He reports his fatigue levels are about the same. Denies any nausea, vomiting, or diarrhea. Denies any new pains. Had not noticed any recent bleeding such as epistaxis, hematuria or hematochezia. Denies recent chest pain on exertion, shortness of breath on minimal exertion, pre-syncopal episodes, or palpitations. Denies any numbness or tingling in hands or feet. Denies any recent fevers, infections, or recent hospitalizations. Patient reports appetite at 100% and energy level at 75%.  He is eating well maintain his weight at this time.  REVIEW OF SYSTEMS:  Review of Systems  Constitutional: Positive for fatigue. Negative for appetite change, chills and fever.  HENT:  Negative.  Negative for hearing loss, lump/mass, mouth sores and nosebleeds.   Eyes: Negative.  Negative for eye problems.  Respiratory: Negative for cough, hemoptysis and shortness of breath.   Cardiovascular: Negative.  Negative for chest pain and leg swelling.  Gastrointestinal: Negative.  Negative for abdominal pain, blood in stool, constipation, diarrhea, nausea and vomiting.  Endocrine: Negative.  Negative for hot flashes.  Genitourinary: Negative.  Negative for bladder incontinence, difficulty urinating, dysuria, frequency and hematuria.   Musculoskeletal:  Negative.  Negative for back pain, flank pain, gait problem and myalgias.  Skin: Negative.  Negative for itching and rash.  Neurological: Negative.  Negative for dizziness, gait problem, headaches, light-headedness and numbness.  Hematological: Negative.  Negative for adenopathy.  Psychiatric/Behavioral: Negative for confusion. The patient is not nervous/anxious.   All other systems reviewed and are negative.    PAST MEDICAL/SURGICAL HISTORY:  Past Medical History:  Diagnosis Date  . Anemia   . Arthritis   . GERD (gastroesophageal reflux disease)   . Hypercholesteremia   . Hypertension   . Iron deficiency anemia due to chronic blood loss 03/17/2018  . Iron deficiency anemia due to chronic blood loss 03/17/2018  . Stroke ([place])   . Symptomatic anemia 01/20/2018  . Vitamin B deficiency    patient denies   Past Surgical History:  Procedure Laterality Date  . BIOPSY  06/24/2018   Procedure: BIOPSY;  Surgeon: Daneil Dolin, MD;  [facility];  Service: Endoscopy;;  gastric   . COLONOSCOPY N/A 06/24/2018   Dr. Gala Romney: Internal hemorrhoids, diverticulosis, 9 mm polyp removed from the sigmoid colon which was hyperplastic.  Marland Kitchen ESOPHAGOGASTRODUODENOSCOPY N/A 06/24/2018   Dr. Gala Romney: Multiple erosions in the stomach and duodenal bulb, gastric biopsies with chronic gastritis with intestinal metaplasia, no H. pylori.  . ESOPHAGOGASTRODUODENOSCOPY (EGD) WITH PROPOFOL N/A 09/17/2018   Procedure: ESOPHAGOGASTRODUODENOSCOPY (EGD) WITH PROPOFOL;  Surgeon: Daneil Dolin, MD;  [facility];  Service: Endoscopy;  Laterality: N/A;  11:00am  . GIVENS CAPSULE STUDY N/A 09/15/2018   Procedure: GIVENS CAPSULE STUDY;  Surgeon: Daneil Dolin, MD;  [facility];  Service: Endoscopy;  Laterality: N/A;  7:30am  . none    . POLYPECTOMY  06/24/2018   Procedure: POLYPECTOMY;  Surgeon: Daneil Dolin, MD;  [facility];  Service: Endoscopy;;  sigmoid polyp hs      SOCIAL HISTORY:  Social History   Socioeconomic History  . Marital status: Married    Spouse name: Godfrey Tritschler  . Number of children: Not on file  . Years of education: Not on file  . Highest education level: 8th grade  Occupational History  . Occupation: retired  Tobacco Use  . Smoking status: Former Smoker    Packs/day: 0.50    Years: 67.00    Pack years: 33.50    Types: Cigarettes    Quit date: 12/11/2017    Years since quitting: 2.6  . Smokeless tobacco: Never Used  Vaping Use  . Vaping Use: Never used  Substance and Sexual Activity  . Alcohol use: No  . Drug use: No  . Sexual activity: Not Currently  Other Topics Concern  . Not on file  Social History Narrative   Lives with wife [name] in [place]   Dog named poppet      Enjoys hanging around [employer]: eats all food groups   Caffeine: coffee   Water: 4-5 cups daily      Wears seat belt    Does not use phone while driving    Oceanographer at home    Social Determinants of Health   Financial Resource Strain: Low Risk   . Difficulty of Paying Living Expenses: Not hard at all  Food Insecurity: No Food Insecurity  . Worried About Charity fundraiser in the Last Year: Never true  . Ran Out of Food in the Last Year: Never true  Transportation Needs: No Transportation Needs  . Lack of Transportation (Medical): No  . Lack of Transportation (Non-Medical): No  Physical Activity:   . Days of Exercise per Week: Not on file  . Minutes of Exercise per Session: Not on file  Stress:   . Feeling of Stress : Not on file  Social Connections: Socially Isolated  . Frequency of Communication with Friends and Family: Never  . Frequency of Social Gatherings with Friends and Family: Never  . Attends Religious Services: Never  . Active Member of Clubs or Organizations: No  . Attends Archivist Meetings: Never  . Marital Status: Married  Human resources officer Violence: Not At Risk  . Fear of Current or  Ex-Partner: No  . Emotionally Abused: No  . Physically Abused: No  . Sexually Abused: No    FAMILY HISTORY:  Family History  Problem Relation Age of Onset  . Colon cancer Neg Hx     CURRENT MEDICATIONS:  Outpatient Encounter Medications as of 08/22/2020  Medication Sig  . alfuzosin (UROXATRAL) 10 MG 24 hr tablet Take 10 mg by mouth at bedtime.  Marland Kitchen amLODipine (NORVASC) 5 MG tablet Take 1 tablet (5 mg total) by mouth daily.  Marland Kitchen aspirin EC 81 MG tablet Take 81 mg by mouth daily.  Marland Kitchen atorvastatin (LIPITOR) 10 MG tablet Take 1 tablet (10 mg total) by mouth daily with supper.  . carvedilol (COREG) 12.5 MG tablet Take 1 tablet (12.5 mg total) by mouth 2 (two) times daily with a meal.  . cholecalciferol (VITAMIN D3) 25 MCG (1000 UNIT) tablet Take 1,000 Units by mouth daily.  . clotrimazole-betamethasone (LOTRISONE) cream Apply 1 application topically 2 (two) times daily.  Marland Kitchen  Ferrous Sulfate (IRON) 325 (65 Fe) MG TABS Take 1 tablet (325 mg total) by mouth daily.  . Multiple Vitamins-Minerals (CENTRUM SILVER PO) Take 1 tablet by mouth daily.  Marland Kitchen nystatin-triamcinolone (MYCOLOG II) cream Apply topically 2 (two) times daily.  Marland Kitchen nystatin-triamcinolone ointment (MYCOLOG) Apply 1 application topically 2 (two) times daily.  . pantoprazole (PROTONIX) 40 MG tablet Take 1 tablet (40 mg total) by mouth daily.  . silodosin (RAPAFLO) 8 MG CAPS capsule Take 1 capsule (8 mg total) by mouth daily with breakfast.  . tamsulosin (FLOMAX) 0.4 MG CAPS capsule Take 1 capsule (0.4 mg total) by mouth daily.  . vitamin C (ASCORBIC ACID) 500 MG tablet Take 500 mg by mouth daily.  . vitamin E 100 UNIT capsule Take 100 Units by mouth daily.    No facility-administered encounter medications on file as of 08/22/2020.    ALLERGIES:  No Known Allergies   PHYSICAL EXAM:  ECOG Performance status: 1  Vitals:   08/22/20 1103  BP: (!) 142/76  Pulse: 72  Resp: 18  Temp: (!) 96.9 F (36.1 C)  SpO2: 100%   Filed  Weights   08/22/20 1103  Weight: 139 lb 4.8 oz (63.2 kg)   Physical Exam Constitutional:      Appearance: Normal appearance. He is normal weight.  Cardiovascular:     Rate and Rhythm: Normal rate and regular rhythm.     Heart sounds: Normal heart sounds.  Pulmonary:     Effort: Pulmonary effort is normal.     Breath sounds: Normal breath sounds.  Abdominal:     General: Bowel sounds are normal.     Palpations: Abdomen is soft.  Musculoskeletal:        General: Normal range of motion.  Skin:    General: Skin is warm.  Neurological:     Mental Status: He is alert and oriented to person, place, and time. Mental status is at baseline.  Psychiatric:        Mood and Affect: Mood normal.        Behavior: Behavior normal.        Thought Content: Thought content normal.        Judgment: Judgment normal.      LABORATORY DATA:  I have reviewed the labs as listed.  CBC    Component Value Date/Time   WBC 6.7 08/18/2020 1323   RBC 4.41 08/18/2020 1323   HGB 10.6 (L) 08/18/2020 1323   HCT 35.9 (L) 08/18/2020 1323   PLT 364 08/18/2020 1323   MCV 81.4 08/18/2020 1323   MCH 24.0 (L) 08/18/2020 1323   MCHC 29.5 (L) 08/18/2020 1323   RDW 19.6 (H) 08/18/2020 1323   LYMPHSABS 0.3 (L) 08/18/2020 1323   MONOABS 0.7 08/18/2020 1323   EOSABS 0.1 08/18/2020 1323   BASOSABS 0.0 08/18/2020 1323   CMP Latest Ref Rng & Units 08/18/2020 05/18/2020 02/16/2020  Glucose 70 - 99 mg/dL 100(H) 97 103(H)  BUN 8 - 23 mg/dL 20 24(H) 18  Creatinine 0.61 - 1.24 mg/dL 1.49(H) 1.46(H) 1.11  Sodium 135 - 145 mmol/L 138 136 140  Potassium 3.5 - 5.1 mmol/L 4.7 4.3 4.2  Chloride 98 - 111 mmol/L 105 103 107  CO2 22 - 32 mmol/L '26 23 26  ' Calcium 8.9 - 10.3 mg/dL 9.4 9.1 9.5  Total Protein 6.5 - 8.1 g/dL 7.0 7.2 6.9  Total Bilirubin 0.3 - 1.2 mg/dL 0.4 0.3 0.4  Alkaline Phos 38 - 126 U/L 85 95 103  AST  15 - 41 U/L '23 20 19  ' ALT 0 - 44 U/L '15 14 14    ' All questions were answered to patient's stated  satisfaction. Encouraged patient to call with any new concerns or questions before his next visit to the cancer center and we can certain see him sooner, if needed.     ASSESSMENT & PLAN:  Iron deficiency anemia due to chronic blood loss 1.  Iron deficiency anemia: -Likely from combination of chronic GI blood loss and malabsorption. 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  Normal duodenal bulb and second part of duodenum. -Colonoscopy on 06/24/2018 showed internal hemorrhoids, few medium mouth diverticula in the entire colon.  Subcentimeter polyp in the sigmoid colon. -Bone marrow biopsy on 08/21/2018 showed normocellular marrow with erythroid hyperplasia.  No increase in plasma cells. -Denies any bright red bleeding per rectum or melena. -He is taking aspirin 81 mg daily and iron tablet daily.  According to the wife he reportedly had a stroke and was placed on aspirin. -Last Feraheme was on 03/09/2020 and 03/16/2020 -Labs collected on 08/18/2020 shows a hemoglobin at 10.6 (10.7). iron saturations 9% (11%) and a ferritin of 37 (42).  -Goal ferritin is around 100. -Although he is relatively asymptomatic, would recommend 2 doses of IV Feraheme scheduled 1 week apart. -Continue oral iron as he is tolerating it well. -We will see him back in 5 months with repeat labs (CBC, Ferritin, iron).  If symptoms worsen patient is to call clinic to be seen sooner.     Orders placed this encounter:  Orders Placed This Encounter  Procedures  . Ferritin  . CBC with Differential/Platelet  . Iron and TIBC   Greater than 50% was spent in counseling and coordination of care with this patient including but not limited to discussion of the relevant topics above (See A&P) including, but not limited to diagnosis and management of acute and chronic medical conditions.    Faythe Casa, NP 08/22/2020 11:58 AM  [place] [phone]

## 2020-08-23 ENCOUNTER — Other Ambulatory Visit: Payer: Self-pay

## 2020-08-23 ENCOUNTER — Telehealth: Payer: Self-pay | Admitting: Family Medicine

## 2020-08-23 DIAGNOSIS — K219 Gastro-esophageal reflux disease without esophagitis: Secondary | ICD-10-CM

## 2020-08-23 MED ORDER — PANTOPRAZOLE SODIUM 40 MG PO TBEC: 40.0000 mg | DELAYED_RELEASE_TABLET | Freq: Every day | ORAL | 0 refills | Status: DC

## 2020-08-23 MED ORDER — AMLODIPINE BESYLATE 5 MG PO TABS
5.0000 mg | ORAL_TABLET | Freq: Every day | ORAL | 5 refills | Status: DC
Start: 2020-08-23 — End: 2021-02-06

## 2020-08-23 MED ORDER — CARVEDILOL 12.5 MG PO TABS
12.5000 mg | ORAL_TABLET | Freq: Two times a day (BID) | ORAL | 1 refills | Status: DC
Start: 2020-08-23 — End: 2020-11-07

## 2020-08-23 NOTE — Telephone Encounter (Signed)
error 

## 2020-08-25 ENCOUNTER — Ambulatory Visit (HOSPITAL_COMMUNITY): Payer: PPO | Admitting: Nurse Practitioner

## 2020-08-25 ENCOUNTER — Other Ambulatory Visit: Payer: Self-pay

## 2020-08-25 ENCOUNTER — Inpatient Hospital Stay (HOSPITAL_COMMUNITY): Payer: PPO

## 2020-08-25 ENCOUNTER — Encounter (HOSPITAL_COMMUNITY): Payer: Self-pay

## 2020-08-25 VITALS — BP 126/69 | HR 66 | Temp 97.1°F | Resp 17

## 2020-08-25 DIAGNOSIS — D500 Iron deficiency anemia secondary to blood loss (chronic): Secondary | ICD-10-CM

## 2020-08-25 DIAGNOSIS — D509 Iron deficiency anemia, unspecified: Secondary | ICD-10-CM | POA: Diagnosis not present

## 2020-08-25 MED ORDER — SODIUM CHLORIDE 0.9 % IV SOLN
510.0000 mg | Freq: Once | INTRAVENOUS | Status: AC
  Administered 2020-08-25: 510 mg via INTRAVENOUS
  Filled 2020-08-25: qty 510

## 2020-08-25 MED ORDER — SODIUM CHLORIDE 0.9 % IV SOLN: Freq: Once | INTRAVENOUS | Status: AC

## 2020-08-25 NOTE — Progress Notes (Signed)
Tolerated iron infusion well today without incidence.  Discharged ambulatory in stable  Condition. Vital signs stable prior to discharge.

## 2020-08-25 NOTE — Patient Instructions (Signed)
[facility] at [facility]  Discharge Instructions:   _______________________________________________________________  Thank you for choosing [facility] at [facility] to provide your oncology and hematology care.  To afford each patient quality time with our providers, please arrive at least 15 minutes before your scheduled appointment.  You need to re-schedule your appointment if you arrive 10 or more minutes late.  We strive to give you quality time with our providers, and arriving late affects you and other patients whose appointments are after yours.  Also, if you no show three or more times for appointments you may be dismissed from the clinic.  Again, thank you for choosing [facility] at [facility]. Our hope is that these requests will allow you access to exceptional care and in a timely manner. _______________________________________________________________  If you have questions after your visit, please contact our office at [phone] between the hours of 8:30 a.m. and 5:00 p.m. Voicemails left after 4:30 p.m. will not be returned until the following business day. _______________________________________________________________  For prescription refill requests, have your pharmacy contact our office. _______________________________________________________________  Recommendations made by the consultant and any test results will be sent to your referring physician. _______________________________________________________________ 

## 2020-08-30 ENCOUNTER — Other Ambulatory Visit: Payer: Self-pay

## 2020-08-30 ENCOUNTER — Ambulatory Visit (INDEPENDENT_AMBULATORY_CARE_PROVIDER_SITE_OTHER): Payer: PPO | Admitting: Family Medicine

## 2020-08-30 ENCOUNTER — Encounter: Payer: Self-pay | Admitting: Family Medicine

## 2020-08-30 VITALS — BP 128/68 | HR 82 | Temp 98.2°F | Ht 67.0 in | Wt 139.0 lb

## 2020-08-30 DIAGNOSIS — I10 Essential (primary) hypertension: Secondary | ICD-10-CM | POA: Diagnosis not present

## 2020-08-30 DIAGNOSIS — K219 Gastro-esophageal reflux disease without esophagitis: Secondary | ICD-10-CM | POA: Diagnosis not present

## 2020-08-30 NOTE — Assessment & Plan Note (Signed)
Doing well, no S&S at this time

## 2020-08-30 NOTE — Patient Instructions (Signed)
  HAPPY FALL!  I appreciate the opportunity to provide you with care for your health and wellness. Today we discussed: overall health   Follow up: March for CPE- fasting mid morning appt with wife  No labs or referrals today  Have a great Holiday Season! See y'all next year, call if you need anything :)  Please continue to practice social distancing to keep you, your family, and our community safe.  If you must go out, please wear a mask and practice good handwashing.  It was a pleasure to see you and I look forward to continuing to work together on your health and well-being. Please do not hesitate to call the office if you need care or have questions about your care.  Have a wonderful day and week. With Gratitude, Cherly Beach, DNP, AGNP-BC

## 2020-08-30 NOTE — Progress Notes (Signed)
Subjective:  Patient ID: Jared Schmidt, male    DOB: [birth year]  Age: 84 y.o. MRN: [id number]  CC:  Chief Complaint  Patient presents with  . Follow-up      HPI  HPI Jared Schmidt is a 84 year old male patient of mine. Presents today for follow-up. Denies having any issues or concerns. Denies having any sleep issues. Denies have any changes in chewing swallowing or appetite. Denies having any blood in urine or stool no changes in bowel or bladder habits. Denies having any memory issues. Denies having any falls or injuries. Denies having any other skin issues outside of what was stated above. Denies having any hearing or vision changes.  Today patient denies signs and symptoms of COVID 19 infection including fever, chills, cough, shortness of breath, and headache. Past Medical, Surgical, Social History, Allergies, and Medications have been Reviewed.   Past Medical History:  Diagnosis Date  . Anemia   . Arthritis   . GERD (gastroesophageal reflux disease)   . Hypercholesteremia   . Hypertension   . Iron deficiency anemia due to chronic blood loss 03/17/2018  . Iron deficiency anemia due to chronic blood loss 03/17/2018  . Stroke ([place])   . Symptomatic anemia 01/20/2018  . Vitamin B deficiency    patient denies    Current Meds  Medication Sig  . alfuzosin (UROXATRAL) 10 MG 24 hr tablet Take 10 mg by mouth at bedtime.  Marland Kitchen amLODipine (NORVASC) 5 MG tablet Take 1 tablet (5 mg total) by mouth daily.  Marland Kitchen aspirin EC 81 MG tablet Take 81 mg by mouth daily.  Marland Kitchen atorvastatin (LIPITOR) 10 MG tablet Take 1 tablet (10 mg total) by mouth daily with supper.  . carvedilol (COREG) 12.5 MG tablet Take 1 tablet (12.5 mg total) by mouth 2 (two) times daily with a meal.  . cholecalciferol (VITAMIN D3) 25 MCG (1000 UNIT) tablet Take 1,000 Units by mouth daily.  . clotrimazole-betamethasone (LOTRISONE) cream Apply 1 application topically 2 (two) times daily.  . Ferrous Sulfate (IRON) 325 (65 Fe) MG  TABS Take 1 tablet (325 mg total) by mouth daily.  . Multiple Vitamins-Minerals (CENTRUM SILVER PO) Take 1 tablet by mouth daily.  Marland Kitchen nystatin-triamcinolone (MYCOLOG II) cream Apply topically 2 (two) times daily.  Marland Kitchen nystatin-triamcinolone ointment (MYCOLOG) Apply 1 application topically 2 (two) times daily.  . pantoprazole (PROTONIX) 40 MG tablet Take 1 tablet (40 mg total) by mouth daily.  . silodosin (RAPAFLO) 8 MG CAPS capsule Take 1 capsule (8 mg total) by mouth daily with breakfast.  . tamsulosin (FLOMAX) 0.4 MG CAPS capsule Take 1 capsule (0.4 mg total) by mouth daily.  . vitamin C (ASCORBIC ACID) 500 MG tablet Take 500 mg by mouth daily.  . vitamin E 100 UNIT capsule Take 100 Units by mouth daily.     ROS:  Review of Systems  Constitutional: Negative.   HENT: Negative.   Eyes: Negative.   Respiratory: Negative.   Cardiovascular: Negative.   Gastrointestinal: Negative.   Genitourinary: Negative.   Musculoskeletal: Negative.   Skin: Negative.   Neurological: Negative.   Endo/Heme/Allergies: Negative.   Psychiatric/Behavioral: Negative.      Objective:   Today's Vitals: BP 128/68 (BP Location: Right Arm, Patient Position: Sitting, Cuff Size: Normal)   Pulse 82   Temp 98.2 F (36.8 C) (Tympanic)   Ht 5\' 7"  (1.702 m)   Wt 139 lb (63 kg)   SpO2 94%   BMI 21.77 kg/m  Vitals with  BMI 08/30/2020 08/25/2020 08/25/2020  Height 5\' 7"  - -  Weight 139 lbs - -  BMI 29.93 - -  Systolic [phone]  Diastolic 68 69 74  Pulse 82 66 70     Physical Exam Vitals and nursing note reviewed.  Constitutional:      Appearance: Normal appearance. He is well-developed, well-groomed and normal weight.  HENT:     Head: Normocephalic and atraumatic.     Right Ear: External ear normal.     Left Ear: External ear normal.     Mouth/Throat:     Comments: Mask in place  Eyes:     General:        Right eye: No discharge.        Left eye: No discharge.     Conjunctiva/sclera:  Conjunctivae normal.     Comments: glasses  Cardiovascular:     Rate and Rhythm: Normal rate and regular rhythm.     Pulses: Normal pulses.     Heart sounds: Normal heart sounds.  Pulmonary:     Effort: Pulmonary effort is normal.     Breath sounds: Normal breath sounds.  Musculoskeletal:        General: Normal range of motion.     Cervical back: Normal range of motion and neck supple.  Skin:    General: Skin is warm.  Neurological:     General: No focal deficit present.     Mental Status: He is alert and oriented to person, place, and time.  Psychiatric:        Attention and Perception: Attention normal.        Mood and Affect: Mood normal.        Speech: Speech normal.        Behavior: Behavior normal. Behavior is cooperative.        Thought Content: Thought content normal.        Cognition and Memory: Cognition normal.        Judgment: Judgment normal.      Assessment   1. Primary hypertension   2. Gastroesophageal reflux disease without esophagitis     Tests ordered No orders of the defined types were placed in this encounter.    Plan: Please see assessment and plan per problem list above.   No orders of the defined types were placed in this encounter.   Patient to follow-up in 01/24/2021  Note: This dictation was prepared with Dragon dictation along with smaller phrase technology. Similar sounding words can be transcribed inadequately or may not be corrected upon review. Any transcriptional errors that result from this process are unintentional.      Perlie Mayo, NP

## 2020-08-30 NOTE — Assessment & Plan Note (Signed)
Jared Schmidt is encouraged to maintain a well balanced diet that is low in salt. Controlled, continue current medication regimen.   No refills needed.  Additionally, he is also reminded that exercise is beneficial for heart health and control of  Blood pressure. 30-60 minutes daily is recommended-walking was suggested.

## 2020-09-01 ENCOUNTER — Inpatient Hospital Stay (HOSPITAL_COMMUNITY): Payer: PPO

## 2020-09-01 ENCOUNTER — Other Ambulatory Visit: Payer: Self-pay

## 2020-09-01 ENCOUNTER — Encounter (HOSPITAL_COMMUNITY): Payer: Self-pay

## 2020-09-01 VITALS — BP 135/79 | HR 63 | Temp 96.9°F | Resp 18

## 2020-09-01 DIAGNOSIS — D500 Iron deficiency anemia secondary to blood loss (chronic): Secondary | ICD-10-CM

## 2020-09-01 DIAGNOSIS — D509 Iron deficiency anemia, unspecified: Secondary | ICD-10-CM | POA: Diagnosis not present

## 2020-09-01 MED ORDER — SODIUM CHLORIDE 0.9 % IV SOLN: Freq: Once | INTRAVENOUS | Status: AC

## 2020-09-01 MED ORDER — SODIUM CHLORIDE 0.9 % IV SOLN
510.0000 mg | Freq: Once | INTRAVENOUS | Status: AC
  Administered 2020-09-01: 510 mg via INTRAVENOUS
  Filled 2020-09-01: qty 510

## 2020-09-01 NOTE — Progress Notes (Signed)
Patient presents today for Feraheme infusion. Vital signs stable. Patient has no complaints of any changes since his visit.   Feraheme  given today per MD orders. Tolerated infusion without adverse affects. Vital signs stable. No complaints at this time. Discharged from clinic ambulatory in stable condition. Alert and oriented x 3. F/U with [facility] as scheduled.

## 2020-09-01 NOTE — Patient Instructions (Signed)
[facility]  Discharge Instructions:   _______________________________________________________________  Thank you for choosing [facility] to provide your oncology and hematology care.  To afford each patient quality time with our providers, please arrive at least 15 minutes before your scheduled appointment.  You need to re-schedule your appointment if you arrive 10 or more minutes late.  We strive to give you quality time with our providers, and arriving late affects you and other patients whose appointments are after yours.  Also, if you no show three or more times for appointments you may be dismissed from the clinic.  Again, thank you for choosing [facility] at [facility]. Our hope is that these requests will allow you access to exceptional care and in a timely manner. _______________________________________________________________  If you have questions after your visit, please contact our office at [phone] between the hours of 8:30 a.m. and 5:00 p.m. Voicemails left after 4:30 p.m. will not be returned until the following business day. _______________________________________________________________  For prescription refill requests, have your pharmacy contact our office. _______________________________________________________________  Recommendations made by the consultant and any test results will be sent to your referring physician. _______________________________________________________________ 

## 2020-10-04 ENCOUNTER — Other Ambulatory Visit (HOSPITAL_COMMUNITY): Payer: PPO

## 2020-10-11 ENCOUNTER — Ambulatory Visit (HOSPITAL_COMMUNITY): Payer: PPO | Admitting: Nurse Practitioner

## 2020-10-15 ENCOUNTER — Other Ambulatory Visit: Payer: Self-pay | Admitting: Family Medicine

## 2020-11-07 ENCOUNTER — Telehealth: Payer: Self-pay

## 2020-11-07 ENCOUNTER — Other Ambulatory Visit: Payer: Self-pay

## 2020-11-07 DIAGNOSIS — I10 Essential (primary) hypertension: Secondary | ICD-10-CM

## 2020-11-07 MED ORDER — CARVEDILOL 12.5 MG PO TABS: 12.5000 mg | ORAL_TABLET | Freq: Two times a day (BID) | ORAL | 1 refills | Status: DC

## 2020-11-07 NOTE — Telephone Encounter (Signed)
Patient needing a refill on carvedilol sent to [facility] ph# [phone]

## 2020-11-07 NOTE — Telephone Encounter (Signed)
Rx refilled, l/m informing pt.

## 2020-11-21 ENCOUNTER — Encounter: Payer: Self-pay | Admitting: Nurse Practitioner

## 2020-11-21 ENCOUNTER — Ambulatory Visit (INDEPENDENT_AMBULATORY_CARE_PROVIDER_SITE_OTHER): Payer: PPO | Admitting: Nurse Practitioner

## 2020-11-21 ENCOUNTER — Other Ambulatory Visit: Payer: Self-pay

## 2020-11-21 DIAGNOSIS — Z0000 Encounter for general adult medical examination without abnormal findings: Secondary | ICD-10-CM | POA: Diagnosis not present

## 2020-11-21 NOTE — Patient Instructions (Addendum)
[name] , Thank you for taking time to come for your Medicare Wellness Visit. I appreciate your ongoing commitment to your health goals. Please review the following plan we discussed and let me know if I can assist you in the future.   Screening recommendations/referrals: Colonoscopy: Complete; no longer required.   Recommended yearly ophthalmology/optometry visit for glaucoma screening and checkup Recommended yearly dental visit for hygiene and checkup  Vaccinations: Influenza vaccine: Complete  Pneumococcal vaccine: Complete  Tdap vaccine: Due  Shingles vaccine: Eligible     Advanced directives: N/A   Conditions/risks identified: None   Next appointment: 01/24/21 @ 10:40 with Donneta Romberg, NP   Preventive Care 83 Years and Older, Male Preventive care refers to lifestyle choices and visits with your health care provider that can promote health and wellness. What does preventive care include?  A yearly physical exam. This is also called an annual well check.  Dental exams once or twice a year.  Routine eye exams. Ask your health care provider how often you should have your eyes checked.  Personal lifestyle choices, including:  Daily care of your teeth and gums.  Regular physical activity.  Eating a healthy diet.  Avoiding tobacco and drug use.  Limiting alcohol use.  Practicing safe sex.  Taking low doses of aspirin every day.  Taking vitamin and mineral supplements as recommended by your health care provider. What happens during an annual well check? The services and screenings done by your health care provider during your annual well check will depend on your age, overall health, lifestyle risk factors, and family history of disease. Counseling  Your health care provider may ask you questions about your:  Alcohol use.  Tobacco use.  Drug use.  Emotional well-being.  Home and relationship well-being.  Sexual activity.  Eating habits.  History of  falls.  Memory and ability to understand (cognition).  Work and work Statistician. Screening  You may have the following tests or measurements:  Height, weight, and BMI.  Blood pressure.  Lipid and cholesterol levels. These may be checked every 5 years, or more frequently if you are over 83 years old.  Skin check.  Lung cancer screening. You may have this screening every year starting at age 83 if you have a 30-pack-year history of smoking and currently smoke or have quit within the past 15 years.  Fecal occult blood test (FOBT) of the stool. You may have this test every year starting at age 83.  Flexible sigmoidoscopy or colonoscopy. You may have a sigmoidoscopy every 5 years or a colonoscopy every 10 years starting at age 83.  Prostate cancer screening. Recommendations will vary depending on your family history and other risks.  Hepatitis C blood test.  Hepatitis B blood test.  Sexually transmitted disease (STD) testing.  Diabetes screening. This is done by checking your blood sugar (glucose) after you have not eaten for a while (fasting). You may have this done every 1-3 years.  Abdominal aortic aneurysm (AAA) screening. You may need this if you are a current or former smoker.  Osteoporosis. You may be screened starting at age 50 if you are at high risk. Talk with your health care provider about your test results, treatment options, and if necessary, the need for more tests. Vaccines  Your health care provider may recommend certain vaccines, such as:  Influenza vaccine. This is recommended every year.  Tetanus, diphtheria, and acellular pertussis (Tdap, Td) vaccine. You may need a Td booster every 10 years.  Zoster vaccine. You may need this after age 91.  Pneumococcal 13-valent conjugate (PCV13) vaccine. One dose is recommended after age 67.  Pneumococcal polysaccharide (PPSV23) vaccine. One dose is recommended after age 54. Talk to your health care provider about  which screenings and vaccines you need and how often you need them. This information is not intended to replace advice given to you by your health care provider. Make sure you discuss any questions you have with your health care provider. Document Released: 11/17/2015 Document Revised: 07/10/2016 Document Reviewed: 08/22/2015 Elsevier Interactive Patient Education  2017 [place] Prevention in the Home Falls can cause injuries. They can happen to people of all ages. There are many things you can do to make your home safe and to help prevent falls. What can I do on the outside of my home?  Regularly fix the edges of walkways and driveways and fix any cracks.  Remove anything that might make you trip as you walk through a door, such as a raised step or threshold.  Trim any bushes or trees on the path to your home.  Use bright outdoor lighting.  Clear any walking paths of anything that might make someone trip, such as rocks or tools.  Regularly check to see if handrails are loose or broken. Make sure that both sides of any steps have handrails.  Any raised decks and porches should have guardrails on the edges.  Have any leaves, snow, or ice cleared regularly.  Use sand or salt on walking paths during winter.  Clean up any spills in your garage right away. This includes oil or grease spills. What can I do in the bathroom?  Use night lights.  Install grab bars by the toilet and in the tub and shower. Do not use towel bars as grab bars.  Use non-skid mats or decals in the tub or shower.  If you need to sit down in the shower, use a plastic, non-slip stool.  Keep the floor dry. Clean up any water that spills on the floor as soon as it happens.  Remove soap buildup in the tub or shower regularly.  Attach bath mats securely with double-sided non-slip rug tape.  Do not have throw rugs and other things on the floor that can make you trip. What can I do in the  bedroom?  Use night lights.  Make sure that you have a light by your bed that is easy to reach.  Do not use any sheets or blankets that are too big for your bed. They should not hang down onto the floor.  Have a firm chair that has side arms. You can use this for support while you get dressed.  Do not have throw rugs and other things on the floor that can make you trip. What can I do in the kitchen?  Clean up any spills right away.  Avoid walking on wet floors.  Keep items that you use a lot in easy-to-reach places.  If you need to reach something above you, use a strong step stool that has a grab bar.  Keep electrical cords out of the way.  Do not use floor polish or wax that makes floors slippery. If you must use wax, use non-skid floor wax.  Do not have throw rugs and other things on the floor that can make you trip. What can I do with my stairs?  Do not leave any items on the stairs.  Make sure that there are  handrails on both sides of the stairs and use them. Fix handrails that are broken or loose. Make sure that handrails are as long as the stairways.  Check any carpeting to make sure that it is firmly attached to the stairs. Fix any carpet that is loose or worn.  Avoid having throw rugs at the top or bottom of the stairs. If you do have throw rugs, attach them to the floor with carpet tape.  Make sure that you have a light switch at the top of the stairs and the bottom of the stairs. If you do not have them, ask someone to add them for you. What else can I do to help prevent falls?  Wear shoes that:  Do not have high heels.  Have rubber bottoms.  Are comfortable and fit you well.  Are closed at the toe. Do not wear sandals.  If you use a stepladder:  Make sure that it is fully opened. Do not climb a closed stepladder.  Make sure that both sides of the stepladder are locked into place.  Ask someone to hold it for you, if possible.  Clearly mark and make  sure that you can see:  Any grab bars or handrails.  First and last steps.  Where the edge of each step is.  Use tools that help you move around (mobility aids) if they are needed. These include:  Canes.  Walkers.  Scooters.  Crutches.  Turn on the lights when you go into a dark area. Replace any light bulbs as soon as they burn out.  Set up your furniture so you have a clear path. Avoid moving your furniture around.  If any of your floors are uneven, fix them.  If there are any pets around you, be aware of where they are.  Review your medicines with your doctor. Some medicines can make you feel dizzy. This can increase your chance of falling. Ask your doctor what other things that you can do to help prevent falls. This information is not intended to replace advice given to you by your health care provider. Make sure you discuss any questions you have with your health care provider. Document Released: 08/17/2009 Document Revised: 03/28/2016 Document Reviewed: 11/25/2014 Elsevier Interactive Patient Education  2017 [employer].

## 2020-11-21 NOTE — Progress Notes (Signed)
Subjective:   Jared Schmidt is a 85 y.o. male who presents for Medicare Annual/Subsequent preventive examination.        Objective:    There were no vitals filed for this visit. There is no height or weight on file to calculate BMI.  Advanced Directives 09/01/2020 08/25/2020 08/22/2020 05/25/2020 03/16/2020 03/09/2020 02/23/2020  Does Patient Have a Medical Advance Directive? Yes Yes No No Yes Yes Yes  Type of Paramedic of [place];Living will Living will;Healthcare Power of [place];Living will [place];Living will Living will;Healthcare Power of Attorney  Does patient want to make changes to medical advance directive? No - Patient declined No - Patient declined - - No - Patient declined No - Patient declined No - Patient declined  Copy of [place] in Chart? No - copy requested No - copy requested - - No - copy requested No - copy requested No - copy requested  Would patient like information on creating a medical advance directive? No - Patient declined No - Patient declined No - Patient declined No - Patient declined No - Patient declined No - Patient declined -    Current Medications (verified) Outpatient Encounter Medications as of 11/21/2020  Medication Sig  . alfuzosin (UROXATRAL) 10 MG 24 hr tablet Take 10 mg by mouth at bedtime.  Marland Kitchen amLODipine (NORVASC) 5 MG tablet Take 1 tablet (5 mg total) by mouth daily.  Marland Kitchen aspirin EC 81 MG tablet Take 81 mg by mouth daily.  Marland Kitchen atorvastatin (LIPITOR) 10 MG tablet Take 1 tablet (10 mg total) by mouth daily with supper.  . carvedilol (COREG) 12.5 MG tablet Take 1 tablet (12.5 mg total) by mouth 2 (two) times daily with a meal.  . cholecalciferol (VITAMIN D3) 25 MCG (1000 UNIT) tablet Take 1,000 Units by mouth daily.  . clotrimazole-betamethasone (LOTRISONE) cream Apply 1 application topically 2 (two) times daily.  . Ferrous Sulfate (IRON) 325 (65 Fe)  MG TABS Take 1 tablet (325 mg total) by mouth daily.  . Multiple Vitamins-Minerals (CENTRUM SILVER PO) Take 1 tablet by mouth daily.  Marland Kitchen nystatin-triamcinolone (MYCOLOG II) cream Apply topically 2 (two) times daily.  Marland Kitchen nystatin-triamcinolone ointment (MYCOLOG) Apply 1 application topically 2 (two) times daily.  . pantoprazole (PROTONIX) 40 MG tablet Take 1 tablet (40 mg total) by mouth daily.  . silodosin (RAPAFLO) 8 MG CAPS capsule Take 1 capsule (8 mg total) by mouth daily with breakfast.  . tamsulosin (FLOMAX) 0.4 MG CAPS capsule Take 1 capsule (0.4 mg total) by mouth daily.  . vitamin C (ASCORBIC ACID) 500 MG tablet Take 500 mg by mouth daily.  . vitamin E 100 UNIT capsule Take 100 Units by mouth daily.    No facility-administered encounter medications on file as of 11/21/2020.    Allergies (verified) Patient has no known allergies.   History: Past Medical History:  Diagnosis Date  . Anemia   . Arthritis   . GERD (gastroesophageal reflux disease)   . Hypercholesteremia   . Hypertension   . Iron deficiency anemia due to chronic blood loss 03/17/2018  . Iron deficiency anemia due to chronic blood loss 03/17/2018  . Stroke ([place])   . Symptomatic anemia 01/20/2018  . Vitamin B deficiency    patient denies   Past Surgical History:  Procedure Laterality Date  . BIOPSY  06/24/2018   Procedure: BIOPSY;  Surgeon: Daneil Dolin, MD;  [facility];  Service: Endoscopy;;  gastric   . COLONOSCOPY N/A 06/24/2018   Dr. Gala Romney: Internal hemorrhoids, diverticulosis, 9 mm polyp removed from the sigmoid colon which was hyperplastic.  Marland Kitchen ESOPHAGOGASTRODUODENOSCOPY N/A 06/24/2018   Dr. Gala Romney: Multiple erosions in the stomach and duodenal bulb, gastric biopsies with chronic gastritis with intestinal metaplasia, no H. pylori.  . ESOPHAGOGASTRODUODENOSCOPY (EGD) WITH PROPOFOL N/A 09/17/2018   Procedure: ESOPHAGOGASTRODUODENOSCOPY (EGD) WITH PROPOFOL;  Surgeon: Daneil Dolin, MD;  [facility];  Service: Endoscopy;  Laterality: N/A;  11:00am  . GIVENS CAPSULE STUDY N/A 09/15/2018   Procedure: GIVENS CAPSULE STUDY;  Surgeon: Daneil Dolin, MD;  [facility];  Service: Endoscopy;  Laterality: N/A;  7:30am  . none    . POLYPECTOMY  06/24/2018   Procedure: POLYPECTOMY;  Surgeon: Daneil Dolin, MD;  [facility];  Service: Endoscopy;;  sigmoid polyp hs   Family History  Problem Relation Age of Onset  . Colon cancer Neg Hx    Social History   Socioeconomic History  . Marital status: Married    Spouse name: Aedin Jeansonne  . Number of children: Not on file  . Years of education: Not on file  . Highest education level: 8th grade  Occupational History  . Occupation: retired  Tobacco Use  . Smoking status: Former Smoker    Packs/day: 0.50    Years: 67.00    Pack years: 33.50    Types: Cigarettes    Quit date: 12/11/2017    Years since quitting: 2.9  . Smokeless tobacco: Never Used  Vaping Use  . Vaping Use: Never used  Substance and Sexual Activity  . Alcohol use: No  . Drug use: No  . Sexual activity: Not Currently  Other Topics Concern  . Not on file  Social History Narrative   Lives with wife [name] in [place]   Dog named poppet      Enjoys hanging around [employer]: eats all food groups   Caffeine: coffee   Water: 4-5 cups daily      Wears seat belt    Does not use phone while driving    Oceanographer at home    Social Determinants of Health   Financial Resource Strain: Low Risk   . Difficulty of Paying Living Expenses: Not hard at all  Food Insecurity: No Food Insecurity  . Worried About Charity fundraiser in the Last Year: Never true  . Ran Out of Food in the Last Year: Never true  Transportation Needs: No Transportation Needs  . Lack of Transportation (Medical): No  . Lack of Transportation (Non-Medical): No  Physical Activity: Not on file  Stress: Not on file  Social Connections: Socially Isolated  .  Frequency of Communication with Friends and Family: Never  . Frequency of Social Gatherings with Friends and Family: Never  . Attends Religious Services: Never  . Active Member of Clubs or Organizations: No  . Attends Archivist Meetings: Never  . Marital Status: Married    Tobacco Counseling Counseling given: Not Answered   Clinical Intake:                 Diabetic? No          Activities of Daily Living In your present state of health, do you have any difficulty performing the following activities: 01/20/2020  Hearing? N  Vision? N  Difficulty concentrating or making decisions? N  Walking  or climbing stairs? N  Dressing or bathing? N  Doing errands, shopping? N  Some recent data might be hidden    Patient Care Team: Perlie Mayo, NP as PCP - General (Family Medicine) Danie Binder, MD (Inactive) as Consulting Physician (Gastroenterology)  Indicate any recent Medical Services you may have received from other than [facility] providers in the past year (date may be approximate).     Assessment:   This is a routine wellness examination for Jaiyon.  Hearing/Vision screen No exam data present  Dietary issues and exercise activities discussed:    Goals   None    Depression Screen PHQ 2/9 Scores 08/30/2020 05/31/2020 01/20/2020 12/16/2019 01/12/2018 12/30/2017  PHQ - 2 Score 0 0 0 0 0 -  Exception Documentation - - - Medical reason - (No Data)    Fall Risk Fall Risk  08/30/2020 05/31/2020 01/20/2020 12/16/2019 09/24/2019  Falls in the past year? 0 0 0 0 0  Comment - - - - Emmi Telephone Survey: data to providers prior to load  Number falls in past yr: 0 0 0 0 -  Injury with Fall? 0 0 0 0 -  Risk for fall due to : No Fall Risks No Fall Risks - - -  Follow up Falls evaluation completed Falls evaluation completed - - -    FALL RISK PREVENTION PERTAINING TO THE HOME:  Any stairs in or around the home? Yes  If so, are there any without handrails?  Yes  Home free of loose throw rugs in walkways, pet beds, electrical cords, etc? Yes  Adequate lighting in your home to reduce risk of falls? Yes   ASSISTIVE DEVICES UTILIZED TO PREVENT FALLS:  Life alert? No  Use of a cane, walker or w/c? No  Grab bars in the bathroom? No  Shower chair or bench in shower? No  Elevated toilet seat or a handicapped toilet? No   TIMED UP AND GO:  Was the test performed? No .     Cognitive Function:        Immunizations Immunization History  Administered Date(s) Administered  . Fluad Quad(high Dose 65+) 07/26/2019  . Influenza, High Dose Seasonal PF 08/08/2017  . Influenza-Unspecified 08/05/2020  . PFIZER(Purple Top)SARS-COV-2 Vaccination 01/09/2020, 02/01/2020, 08/20/2020  . Pneumococcal Polysaccharide-23 07/04/2007    TDAP status: Due, Education has been provided regarding the importance of this vaccine. Advised may receive this vaccine at local pharmacy or Health Dept. Aware to provide a copy of the vaccination record if obtained from local pharmacy or Health Dept. Verbalized acceptance and understanding.  Flu Vaccine status: Up to date  Pneumococcal vaccine status: Up to date  Covid-19 vaccine status: Completed vaccines  Qualifies for Shingles Vaccine? Yes   Zostavax completed Yes   Shingrix Completed?: No.    Education has been provided regarding the importance of this vaccine. Patient has been advised to call insurance company to determine out of pocket expense if they have not yet received this vaccine. Advised may also receive vaccine at local pharmacy or Health Dept. Verbalized acceptance and understanding.  Screening Tests Health Maintenance  Topic Date Due  . TETANUS/TDAP  Never done  . PNA vac Low Risk Adult (2 of 2 - PCV13) 07/03/2008  . COVID-19 Vaccine (4 - Booster for Pfizer series) 02/18/2021  . INFLUENZA VACCINE  Completed    Health Maintenance  Health Maintenance Due  Topic Date Due  . TETANUS/TDAP  Never done   . PNA vac Low Risk Adult (  2 of 2 - PCV13) 07/03/2008    Colorectal cancer screening: No longer required.   Lung Cancer Screening: (Low Dose CT Chest recommended if Age 28-80 years, 30 pack-year currently smoking OR have quit w/in 15years.) does not qualify.    Additional Screening:  Hepatitis C Screening: does qualify; Completed.   Vision Screening: Recommended annual ophthalmology exams for early detection of glaucoma and other disorders of the eye. Is the patient up to date with their annual eye exam?  Yes    Who is the provider or what is the name of the office in which the patient attends annual eye exams? Dr. Radford Pax   If pt is not established with a provider, would they like to be referred to a provider to establish care? No .   Dental Screening: Recommended annual dental exams for proper oral hygiene  Community Resource Referral / Chronic Care Management: CRR required this visit?  No   CCM required this visit?  No      Plan:     I have personally reviewed and noted the following in the patient's chart:   . Medical and social history . Use of alcohol, tobacco or illicit drugs  . Current medications and supplements . Functional ability and status . Nutritional status . Physical activity . Advanced directives . List of other physicians . Hospitalizations, surgeries, and ER visits in previous 12 months . Vitals . Screenings to include cognitive, depression, and falls . Referrals and appointments  In addition, I have reviewed and discussed with patient certain preventive protocols, quality metrics, and best practice recommendations. A written personalized care plan for preventive services as well as general preventive health recommendations were provided to patient.     Lonn Georgia, LPN   8/84/1660   Nurse Notes: AWV conducted over the phone with pt consent to televisit via audio. Pt was in the home at the time of call, and provider off site. This call took  approx 20 min to complete.

## 2020-12-04 ENCOUNTER — Other Ambulatory Visit: Payer: Self-pay | Admitting: Family Medicine

## 2020-12-04 DIAGNOSIS — D500 Iron deficiency anemia secondary to blood loss (chronic): Secondary | ICD-10-CM

## 2020-12-12 ENCOUNTER — Other Ambulatory Visit: Payer: Self-pay | Admitting: Family Medicine

## 2020-12-12 DIAGNOSIS — I10 Essential (primary) hypertension: Secondary | ICD-10-CM

## 2020-12-21 ENCOUNTER — Other Ambulatory Visit: Payer: Self-pay | Admitting: Family Medicine

## 2020-12-21 DIAGNOSIS — K219 Gastro-esophageal reflux disease without esophagitis: Secondary | ICD-10-CM

## 2021-01-05 ENCOUNTER — Other Ambulatory Visit: Payer: Self-pay

## 2021-01-05 ENCOUNTER — Telehealth: Payer: Self-pay

## 2021-01-05 DIAGNOSIS — I10 Essential (primary) hypertension: Secondary | ICD-10-CM

## 2021-01-05 MED ORDER — CARVEDILOL 12.5 MG PO TABS: 12.5000 mg | ORAL_TABLET | Freq: Two times a day (BID) | ORAL | 3 refills | Status: DC

## 2021-01-05 NOTE — Telephone Encounter (Signed)
Patient needing refills on Carvedilol sent to [facility] drug p# [phone]

## 2021-01-05 NOTE — Telephone Encounter (Signed)
Rx sent it.

## 2021-01-13 ENCOUNTER — Emergency Department (HOSPITAL_COMMUNITY): Payer: PPO

## 2021-01-13 ENCOUNTER — Encounter (HOSPITAL_COMMUNITY): Payer: Self-pay | Admitting: Emergency Medicine

## 2021-01-13 ENCOUNTER — Emergency Department (HOSPITAL_COMMUNITY)
Admission: EM | Admit: 2021-01-13 | Discharge: 2021-01-13 | Disposition: A | Discharge status: Home / Self Care | Payer: PPO | Attending: Emergency Medicine | Admitting: Emergency Medicine

## 2021-01-13 ENCOUNTER — Other Ambulatory Visit: Payer: Self-pay

## 2021-01-13 DIAGNOSIS — R042 Hemoptysis: Secondary | ICD-10-CM

## 2021-01-13 DIAGNOSIS — Z79899 Other long term (current) drug therapy: Secondary | ICD-10-CM | POA: Insufficient documentation

## 2021-01-13 DIAGNOSIS — I10 Essential (primary) hypertension: Secondary | ICD-10-CM | POA: Diagnosis not present

## 2021-01-13 DIAGNOSIS — Z87891 Personal history of nicotine dependence: Secondary | ICD-10-CM | POA: Insufficient documentation

## 2021-01-13 DIAGNOSIS — Z7982 Long term (current) use of aspirin: Secondary | ICD-10-CM | POA: Diagnosis not present

## 2021-01-13 DIAGNOSIS — J449 Chronic obstructive pulmonary disease, unspecified: Secondary | ICD-10-CM | POA: Diagnosis not present

## 2021-01-13 DIAGNOSIS — Z0389 Encounter for observation for other suspected diseases and conditions ruled out: Secondary | ICD-10-CM | POA: Diagnosis not present

## 2021-01-13 DIAGNOSIS — R918 Other nonspecific abnormal finding of lung field: Secondary | ICD-10-CM | POA: Insufficient documentation

## 2021-01-13 LAB — CBC
HCT: 37.5 % — ABNORMAL LOW (ref 39.0–52.0)
Hemoglobin: 10.8 g/dL — ABNORMAL LOW (ref 13.0–17.0)
MCH: 24.5 pg — ABNORMAL LOW (ref 26.0–34.0)
MCHC: 28.8 g/dL — ABNORMAL LOW (ref 30.0–36.0)
MCV: 85 fL (ref 80.0–100.0)
Platelets: 405 10*3/uL — ABNORMAL HIGH (ref 150–400)
RBC: 4.41 MIL/uL (ref 4.22–5.81)
RDW: 16.9 % — ABNORMAL HIGH (ref 11.5–15.5)
WBC: 6.4 10*3/uL (ref 4.0–10.5)
nRBC: 0 % (ref 0.0–0.2)

## 2021-01-13 LAB — COMPREHENSIVE METABOLIC PANEL
ALT: 15 U/L (ref 0–44)
AST: 24 U/L (ref 15–41)
Albumin: 3.8 g/dL (ref 3.5–5.0)
Alkaline Phosphatase: 92 U/L (ref 38–126)
Anion gap: 8 (ref 5–15)
BUN: 21 mg/dL (ref 8–23)
CO2: 24 mmol/L (ref 22–32)
Calcium: 9.4 mg/dL (ref 8.9–10.3)
Chloride: 107 mmol/L (ref 98–111)
Creatinine, Ser: 1.36 mg/dL — ABNORMAL HIGH (ref 0.61–1.24)
GFR, Estimated: 52 mL/min — ABNORMAL LOW (ref >60)
Glucose, Bld: 111 mg/dL — ABNORMAL HIGH (ref 70–99)
Potassium: 4.3 mmol/L (ref 3.5–5.1)
Sodium: 139 mmol/L (ref 135–145)
Total Bilirubin: 0.3 mg/dL (ref 0.3–1.2)
Total Protein: 7.1 g/dL (ref 6.5–8.1)

## 2021-01-13 LAB — TYPE AND SCREEN
ABO/RH(D): A POS
Antibody Screen: NEGATIVE

## 2021-01-13 MED ORDER — IOHEXOL 350 MG/ML SOLN
75.0000 mL | Freq: Once | INTRAVENOUS | Status: AC | PRN
  Administered 2021-01-13: 75 mL via INTRAVENOUS

## 2021-01-13 NOTE — ED Triage Notes (Addendum)
Pt to the ED with c/o hematemesis since 1145 today. Pt has a sample for review. Patient has a history of fresh blood in his stomach and small bowel.   Pt denies dizziness or pain.

## 2021-01-13 NOTE — ED Provider Notes (Signed)
[facility] Provider Note  CSN: [id number] Arrival date & time: 01/13/21 1343    History Chief Complaint  Patient presents with  . Hemoptysis    HPI  Jared Schmidt is a 85 y.o. male presents for evaluation of hemoptysis. Triage note mentions hematemesis but patient is clear to me that he is coughing up blood mixed with some sputum. Not a large volume, not associated with change in his chronic SOB, no fever or chest pain but he can 'hear a rattle' in his chest. He has had EGD with gastritis in the past (2019). Reports a prior history of smoking and COPD. No known malignancy. Not taking a blood thinner.    Past Medical History:  Diagnosis Date  . Anemia   . Arthritis   . GERD (gastroesophageal reflux disease)   . Hypercholesteremia   . Hypertension   . Iron deficiency anemia due to chronic blood loss 03/17/2018  . Iron deficiency anemia due to chronic blood loss 03/17/2018  . Stroke ([place])   . Symptomatic anemia 01/20/2018  . Vitamin B deficiency    patient denies    Past Surgical History:  Procedure Laterality Date  . BIOPSY  06/24/2018   Procedure: BIOPSY;  Surgeon: Daneil Dolin, MD;  [facility];  Service: Endoscopy;;  gastric   . COLONOSCOPY N/A 06/24/2018   Dr. Gala Romney: Internal hemorrhoids, diverticulosis, 9 mm polyp removed from the sigmoid colon which was hyperplastic.  Marland Kitchen ESOPHAGOGASTRODUODENOSCOPY N/A 06/24/2018   Dr. Gala Romney: Multiple erosions in the stomach and duodenal bulb, gastric biopsies with chronic gastritis with intestinal metaplasia, no H. pylori.  . ESOPHAGOGASTRODUODENOSCOPY (EGD) WITH PROPOFOL N/A 09/17/2018   Procedure: ESOPHAGOGASTRODUODENOSCOPY (EGD) WITH PROPOFOL;  Surgeon: Daneil Dolin, MD;  [facility];  Service: Endoscopy;  Laterality: N/A;  11:00am  . GIVENS CAPSULE STUDY N/A 09/15/2018   Procedure: GIVENS CAPSULE STUDY;  Surgeon: Daneil Dolin, MD;  [facility];  Service: Endoscopy;   Laterality: N/A;  7:30am  . none    . POLYPECTOMY  06/24/2018   Procedure: POLYPECTOMY;  Surgeon: Daneil Dolin, MD;  [facility];  Service: Endoscopy;;  sigmoid polyp hs    Family History  Problem Relation Age of Onset  . Colon cancer Neg Hx     Social History   Tobacco Use  . Smoking status: Former Smoker    Packs/day: 0.50    Years: 67.00    Pack years: 33.50    Types: Cigarettes    Quit date: 12/11/2017    Years since quitting: 3.0  . Smokeless tobacco: Never Used  Vaping Use  . Vaping Use: Never used  Substance Use Topics  . Alcohol use: No  . Drug use: No     Home Medications Prior to Admission medications   Medication Sig Start Date End Date Taking? Authorizing Provider  amLODipine (NORVASC) 5 MG tablet Take 1 tablet (5 mg total) by mouth daily. 08/23/20  Yes Perlie Mayo, NP  aspirin EC 81 MG tablet Take 81 mg by mouth daily.   Yes [provider]  atorvastatin (LIPITOR) 10 MG tablet Take 1 tablet (10 mg total) by mouth daily with supper. 02/17/20  Yes Perlie Mayo, NP  carvedilol (COREG) 12.5 MG tablet Take 1 tablet (12.5 mg total) by mouth 2 (two) times daily with a meal. 01/05/21  Yes Perlie Mayo, NP  cholecalciferol (VITAMIN D3) 25 MCG (1000 UNIT) tablet Take 1,000 Units  by mouth daily.   Yes [provider]  clotrimazole-betamethasone (LOTRISONE) cream Apply 1 application topically 2 (two) times daily. 09/09/18  Yes [provider]  FEROSUL 325 (65 Fe) MG tablet TAKE 1 TABLET BY MOUTH EVERY DAY 12/04/20  Yes Perlie Mayo, NP  Multiple Vitamins-Minerals (CENTRUM SILVER PO) Take 1 tablet by mouth daily.   Yes [provider]  nystatin-triamcinolone (MYCOLOG II) cream Apply topically 2 (two) times daily. 05/31/20  Yes [provider]  nystatin-triamcinolone ointment (MYCOLOG) Apply 1 application topically 2 (two) times daily. 05/31/20  Yes Perlie Mayo, NP  pantoprazole (PROTONIX) 40 MG tablet TAKE 1  TABLET BY MOUTH EVERY DAY 12/21/20  Yes Perlie Mayo, NP  silodosin (RAPAFLO) 8 MG CAPS capsule Take 1 capsule (8 mg total) by mouth daily with breakfast. 03/08/20  Yes McKenzie, Candee Furbish, MD  tamsulosin (FLOMAX) 0.4 MG CAPS capsule Take 1 capsule (0.4 mg total) by mouth daily. 07/17/20  Yes McKenzie, Candee Furbish, MD  vitamin C (ASCORBIC ACID) 500 MG tablet Take 500 mg by mouth daily.   Yes [provider]  vitamin E 100 UNIT capsule Take 100 Units by mouth daily.    Yes [provider]     Allergies    Patient has no known allergies.   Review of Systems   Review of Systems A comprehensive review of systems was completed and negative except as noted in HPI.    Physical Exam BP (!) 150/82   Pulse 74   Temp 98 F (36.7 C) (Oral)   Resp 18   Ht 5\' 7"  (1.702 m)   Wt 63.5 kg   SpO2 100%   BMI 21.93 kg/m   Physical Exam Vitals and nursing note reviewed.  Constitutional:      Appearance: Normal appearance.  HENT:     Head: Normocephalic and atraumatic.     Nose: Nose normal.     Mouth/Throat:     Mouth: Mucous membranes are moist.  Eyes:     Extraocular Movements: Extraocular movements intact.     Conjunctiva/sclera: Conjunctivae normal.  Cardiovascular:     Rate and Rhythm: Normal rate.  Pulmonary:     Effort: Pulmonary effort is normal.     Breath sounds: Normal breath sounds.  Abdominal:     General: Abdomen is flat.     Palpations: Abdomen is soft.     Tenderness: There is no abdominal tenderness.  Musculoskeletal:        General: No swelling. Normal range of motion.     Cervical back: Neck supple.  Skin:    General: Skin is warm and dry.  Neurological:     General: No focal deficit present.     Mental Status: He is alert.  Psychiatric:        Mood and Affect: Mood normal.      ED Results / Procedures / Treatments   Labs (all labs ordered are listed, but only abnormal results are displayed) Labs Reviewed  COMPREHENSIVE METABOLIC PANEL -  Abnormal; Notable for the following components:      Result Value   Glucose, Bld 111 (*)    Creatinine, Ser 1.36 (*)    GFR, Estimated 52 (*)    All other components within normal limits  CBC - Abnormal; Notable for the following components:   Hemoglobin 10.8 (*)    HCT 37.5 (*)    MCH 24.5 (*)    MCHC 28.8 (*)    RDW 16.9 (*)  Platelets 405 (*)    All other components within normal limits  TYPE AND SCREEN    EKG None  Radiology DG Chest 2 View  Addendum Date: 01/13/2021   ADDENDUM REPORT: 01/13/2021 17:47 ADDENDUM: These results were called by telephone at the time of interpretation on 01/13/2021 at 5:46 pm to provider [facility] , who verbally acknowledged these results. Electronically Signed   By: Zetta Bills M.D.   On: 01/13/2021 17:47   Result Date: 01/13/2021 CLINICAL DATA:  Hemoptysis, prior smoker. EXAM: CHEST - 2 VIEW COMPARISON:  None. FINDINGS: Trachea midline.  Cardiomediastinal contours are normal. Nodule projecting over the RIGHT suprahilar region measuring approximately 2.1 x 2.1 cm. No sign of effusion or lobar consolidative changes. On limited assessment no acute skeletal process. IMPRESSION: Nodule projecting over the RIGHT suprahilar region measuring approximately 2.1 x 2.1 cm. This is suspicious for neoplasm. CT of the chest is suggested for further evaluation. Electronically Signed: By: Zetta Bills M.D. On: 01/13/2021 17:43   CT Angio Chest PE W/Cm &/Or Wo Cm  Result Date: 01/13/2021 CLINICAL DATA:  Hematemesis. EXAM: CT ANGIOGRAPHY CHEST WITH CONTRAST TECHNIQUE: Multidetector CT imaging of the chest was performed using the standard protocol during bolus administration of intravenous contrast. Multiplanar CT image reconstructions and MIPs were obtained to evaluate the vascular anatomy. CONTRAST:  52mL OMNIPAQUE IOHEXOL 350 MG/ML SOLN COMPARISON:  None. FINDINGS: Cardiovascular: There is moderate severity calcification of the thoracic aorta, without  evidence of aneurysmal dilatation or dissection. Satisfactory opacification of the pulmonary arteries to the segmental level. No evidence of pulmonary embolism. Normal heart size with moderate to marked severity coronary artery calcification. A small anterior pericardial effusion is seen. Mediastinum/Nodes: No enlarged mediastinal, hilar, or axillary lymph nodes. A calcified cystic appearing areas seen within the anteromedial aspect of the left lobe of the thyroid gland. The trachea and esophagus demonstrate no significant findings. Lungs/Pleura: Mild emphysematous lung disease is seen within the bilateral upper lobes. 2.8 cm x 1.7 cm and 3.1 cm x 2.3 cm heterogeneous noncalcified lung masses are seen within the inferior medial aspect of the left upper lobe. There is no evidence of acute infiltrate, pleural effusion or pneumothorax. Upper Abdomen: No acute abnormality. Musculoskeletal: Degenerative changes seen throughout the thoracic spine. Review of the MIP images confirms the above findings. IMPRESSION: 1. Heterogeneous, noncalcified right upper lobe lung masses, concerning for the presence of an underlying neoplastic process. Correlation with nuclear medicine PET/CT is recommended. 2. Small anterior pericardial effusion. 3. Mild emphysematous lung disease. 4. Aortic atherosclerosis. Aortic Atherosclerosis (ICD10-I70.0). Electronically Signed   By: Virgina Norfolk M.D.   On: 01/13/2021 18:59    Procedures Procedures  Medications Ordered in the ED Medications  iohexol (OMNIPAQUE) 350 MG/ML injection 75 mL (75 mLs Intravenous Contrast Given 01/13/21 1832)     MDM Rules/Calculators/A&P MDM Patient with history of smoking and COPD now with small volume hemoptysis. Labs done in triage reviewed, anemia is at baseline. Otherwise no significant findings. Will send for CXR to eval signs of PNA or malignancy.  ED Course  I have reviewed the triage vital signs and the nursing notes.  Pertinent labs &  imaging results that were available during my care of the patient were reviewed by me and considered in my medical decision making (see chart for details).  Clinical Course as of 01/13/21 2029  Sat Jan 13, 2021  1851 CXR images reviewed with radiologist. Patient and wife at bedside aware. Will send for CT to eval nodule  and for PE as possible secondary etiology.  [CS]  1950 Wife at bedside describes a larger volume of blood when he first started coughing stuff up earlier today than what the patient mentioned on initial evaluation.  [CS]  2024 Reviewed CT images, discussed results with the patient and wife. He has not had any significant hemoptysis since arrival. I discussed admission vs outpatient evaluation of his lung mass. He states he cannot stay tonight. He already sees Dr. Delton Coombes for anemia and has an appointment with him in about 10 days. Advised to call to see if that appointment could be moved up to begin the diagnostic process for his lung mass. In the meantime, advised to return to the ED if his hemoptysis returns or if he has any other concerns.  [CS]    Clinical Course User Index [CS] Truddie Hidden, MD    Final Clinical Impression(s) / ED Diagnoses Final diagnoses:  Hemoptysis  Lung mass    Rx / DC Orders ED Discharge Orders    None       Truddie Hidden, MD 01/13/21 2029

## 2021-01-15 ENCOUNTER — Ambulatory Visit: Payer: PPO | Admitting: Urology

## 2021-01-15 DIAGNOSIS — R351 Nocturia: Secondary | ICD-10-CM

## 2021-01-17 ENCOUNTER — Other Ambulatory Visit: Payer: Self-pay

## 2021-01-17 ENCOUNTER — Inpatient Hospital Stay (HOSPITAL_COMMUNITY): Payer: PPO | Attending: Hematology

## 2021-01-17 ENCOUNTER — Inpatient Hospital Stay (HOSPITAL_COMMUNITY): Payer: PPO

## 2021-01-17 DIAGNOSIS — R042 Hemoptysis: Secondary | ICD-10-CM | POA: Insufficient documentation

## 2021-01-17 DIAGNOSIS — E7800 Pure hypercholesterolemia, unspecified: Secondary | ICD-10-CM | POA: Diagnosis not present

## 2021-01-17 DIAGNOSIS — K219 Gastro-esophageal reflux disease without esophagitis: Secondary | ICD-10-CM | POA: Diagnosis not present

## 2021-01-17 DIAGNOSIS — Z79899 Other long term (current) drug therapy: Secondary | ICD-10-CM | POA: Insufficient documentation

## 2021-01-17 DIAGNOSIS — R9389 Abnormal findings on diagnostic imaging of other specified body structures: Secondary | ICD-10-CM | POA: Diagnosis not present

## 2021-01-17 DIAGNOSIS — K909 Intestinal malabsorption, unspecified: Secondary | ICD-10-CM | POA: Insufficient documentation

## 2021-01-17 DIAGNOSIS — Z800 Family history of malignant neoplasm of digestive organs: Secondary | ICD-10-CM | POA: Diagnosis not present

## 2021-01-17 DIAGNOSIS — R5383 Other fatigue: Secondary | ICD-10-CM | POA: Insufficient documentation

## 2021-01-17 DIAGNOSIS — Z87891 Personal history of nicotine dependence: Secondary | ICD-10-CM | POA: Insufficient documentation

## 2021-01-17 DIAGNOSIS — N400 Enlarged prostate without lower urinary tract symptoms: Secondary | ICD-10-CM | POA: Insufficient documentation

## 2021-01-17 DIAGNOSIS — Z7982 Long term (current) use of aspirin: Secondary | ICD-10-CM | POA: Diagnosis not present

## 2021-01-17 DIAGNOSIS — E539 Vitamin B deficiency, unspecified: Secondary | ICD-10-CM | POA: Diagnosis not present

## 2021-01-17 DIAGNOSIS — Z8673 Personal history of transient ischemic attack (TIA), and cerebral infarction without residual deficits: Secondary | ICD-10-CM | POA: Insufficient documentation

## 2021-01-17 DIAGNOSIS — R059 Cough, unspecified: Secondary | ICD-10-CM | POA: Diagnosis not present

## 2021-01-17 DIAGNOSIS — R21 Rash and other nonspecific skin eruption: Secondary | ICD-10-CM | POA: Diagnosis not present

## 2021-01-17 DIAGNOSIS — C3411 Malignant neoplasm of upper lobe, right bronchus or lung: Secondary | ICD-10-CM | POA: Diagnosis not present

## 2021-01-17 DIAGNOSIS — D500 Iron deficiency anemia secondary to blood loss (chronic): Secondary | ICD-10-CM | POA: Insufficient documentation

## 2021-01-17 DIAGNOSIS — Z125 Encounter for screening for malignant neoplasm of prostate: Secondary | ICD-10-CM | POA: Diagnosis not present

## 2021-01-17 DIAGNOSIS — I10 Essential (primary) hypertension: Secondary | ICD-10-CM | POA: Insufficient documentation

## 2021-01-17 DIAGNOSIS — R918 Other nonspecific abnormal finding of lung field: Secondary | ICD-10-CM | POA: Diagnosis not present

## 2021-01-17 LAB — CBC WITH DIFFERENTIAL/PLATELET
Abs Immature Granulocytes: 0.02 10*3/uL (ref 0.00–0.07)
Basophils Absolute: 0 10*3/uL (ref 0.0–0.1)
Basophils Relative: 1 %
Eosinophils Absolute: 0.2 10*3/uL (ref 0.0–0.5)
Eosinophils Relative: 4 %
HCT: 33.6 % — ABNORMAL LOW (ref 39.0–52.0)
Hemoglobin: 9.6 g/dL — ABNORMAL LOW (ref 13.0–17.0)
Immature Granulocytes: 0 %
Lymphocytes Relative: 8 %
Lymphs Abs: 0.5 10*3/uL — ABNORMAL LOW (ref 0.7–4.0)
MCH: 24.4 pg — ABNORMAL LOW (ref 26.0–34.0)
MCHC: 28.6 g/dL — ABNORMAL LOW (ref 30.0–36.0)
MCV: 85.3 fL (ref 80.0–100.0)
Monocytes Absolute: 0.8 10*3/uL (ref 0.1–1.0)
Monocytes Relative: 13 %
Neutro Abs: 4.3 10*3/uL (ref 1.7–7.7)
Neutrophils Relative %: 74 %
Platelets: 352 10*3/uL (ref 150–400)
RBC: 3.94 MIL/uL — ABNORMAL LOW (ref 4.22–5.81)
RDW: 17.3 % — ABNORMAL HIGH (ref 11.5–15.5)
WBC: 5.8 10*3/uL (ref 4.0–10.5)
nRBC: 0 % (ref 0.0–0.2)

## 2021-01-17 LAB — COMPREHENSIVE METABOLIC PANEL
ALT: 13 U/L (ref 0–44)
AST: 21 U/L (ref 15–41)
Albumin: 3.6 g/dL (ref 3.5–5.0)
Alkaline Phosphatase: 84 U/L (ref 38–126)
Anion gap: 9 (ref 5–15)
BUN: 20 mg/dL (ref 8–23)
CO2: 23 mmol/L (ref 22–32)
Calcium: 9.1 mg/dL (ref 8.9–10.3)
Chloride: 109 mmol/L (ref 98–111)
Creatinine, Ser: 1.42 mg/dL — ABNORMAL HIGH (ref 0.61–1.24)
GFR, Estimated: 49 mL/min — ABNORMAL LOW (ref >60)
Glucose, Bld: 93 mg/dL (ref 70–99)
Potassium: 4.4 mmol/L (ref 3.5–5.1)
Sodium: 141 mmol/L (ref 135–145)
Total Bilirubin: 0.3 mg/dL (ref 0.3–1.2)
Total Protein: 6.9 g/dL (ref 6.5–8.1)

## 2021-01-17 LAB — IRON AND TIBC
Iron: 40 ug/dL — ABNORMAL LOW (ref 45–182)
Saturation Ratios: 11 % — ABNORMAL LOW (ref 17.9–39.5)
TIBC: 354 ug/dL (ref 250–450)
UIBC: 314 ug/dL

## 2021-01-17 LAB — FERRITIN: Ferritin: 29 ng/mL (ref 24–336)

## 2021-01-18 ENCOUNTER — Inpatient Hospital Stay (HOSPITAL_BASED_OUTPATIENT_CLINIC_OR_DEPARTMENT_OTHER): Payer: PPO | Admitting: Hematology

## 2021-01-18 ENCOUNTER — Ambulatory Visit (HOSPITAL_COMMUNITY)
Admission: RE | Admit: 2021-01-18 | Discharge: 2021-01-18 | Disposition: A | Discharge status: Home / Self Care | Payer: PPO | Source: Ambulatory Visit | Attending: Physician Assistant | Admitting: Physician Assistant

## 2021-01-18 ENCOUNTER — Other Ambulatory Visit: Payer: Self-pay

## 2021-01-18 VITALS — BP 144/76 | HR 69 | Temp 97.8°F | Resp 17 | Wt 142.4 lb

## 2021-01-18 DIAGNOSIS — I639 Cerebral infarction, unspecified: Secondary | ICD-10-CM | POA: Diagnosis not present

## 2021-01-18 DIAGNOSIS — R918 Other nonspecific abnormal finding of lung field: Secondary | ICD-10-CM | POA: Insufficient documentation

## 2021-01-18 DIAGNOSIS — D500 Iron deficiency anemia secondary to blood loss (chronic): Secondary | ICD-10-CM | POA: Diagnosis not present

## 2021-01-18 DIAGNOSIS — H748X2 Other specified disorders of left middle ear and mastoid: Secondary | ICD-10-CM | POA: Diagnosis not present

## 2021-01-18 DIAGNOSIS — C3411 Malignant neoplasm of upper lobe, right bronchus or lung: Secondary | ICD-10-CM | POA: Diagnosis not present

## 2021-01-18 DIAGNOSIS — G9389 Other specified disorders of brain: Secondary | ICD-10-CM | POA: Diagnosis not present

## 2021-01-18 DIAGNOSIS — I6782 Cerebral ischemia: Secondary | ICD-10-CM | POA: Diagnosis not present

## 2021-01-18 MED ORDER — GADOBUTROL 1 MMOL/ML IV SOLN
7.0000 mL | Freq: Once | INTRAVENOUS | Status: AC | PRN
  Administered 2021-01-18: 7 mL via INTRAVENOUS

## 2021-01-18 NOTE — Progress Notes (Signed)
[place] [address],  [address]   CLINIC:  Medical Oncology/Hematology  CONSULT NOTE  Patient Care Team: Perlie Mayo, NP as PCP - General (Family Medicine) Danie Binder, MD (Inactive) as Consulting Physician (Gastroenterology)  CHIEF COMPLAINTS/PURPOSE OF CONSULTATION:  New lung masses concerning for possible malignancy  HISTORY OF PRESENTING ILLNESS: [name] 85 y.o. male presents for work-up of new right-sided lung masses, as well as follow-up of of his iron deficiency anemia.   Mr. Bhatnagar was recently seen in the [facility] Emergency Department on 01/13/2021 with a chief complaint of hemoptysis. CT angiography of his chest was negative for pulmonary embolism, but did show heterogenous noncalcified lung masses in the right upper lobe, measuring 2.8 cm x 1.7 cm and 3.1 cm x 2.3 cm, which are concerning for underlying neoplastic process. Mr. Sortor reports a positive history of tobacco use for 50-pack-year history, quit in 2012. His family history is positive for colon cancer in his mother at age 22. He does not have any previous diagnosis of cancer.  Mr. Liller reports that on 01/13/2021 he noted a new cough, described as wet and rattling, different from usual. He coughed up several small blood clots, as well as blood-tinged sputum and a small amount of liquid blood. This was his first episode of hemoptysis, and it has not recurred since that time. Cough has resolved. No shortness of breath at rest, but he is easily winded with exertion.  He denies chest pain. He has continued to take aspirin, but is not on any other blood thinners.  He reports worsening fatigue and becomes tired more easily than he used to. He denies B-symptoms such as fever, chills, weight loss, and night sweats.  No new masses or lymphadenopathy per his report.  Patient reports appetite at100% and energy level at50%. He is maintaining stable weight at this time.  Denies  recent thrombotic events.  No new onset or worsening peripheral edema. No new bone pain or recent fractures. No new neurologic symptoms such as new-onset hearing loss, blurred vision, headache, peripheral neuropathy, or dizziness.   He has not noticed any other recent bleeding (apart from hemoptysis as above), such as epistaxis, hematuria or hematochezia.  Denies recent chest pain on exertion, pre-syncopal episodes, or palpitations.   Patient does note a rash on his left leg, left back, and left flank which appears to be a  hyperpigmented plaque and is described as itchy and sometimes painful. This rash has been intermittent for the past two years. No new drug exposures.  MEDICAL HISTORY:  Past Medical History:  Diagnosis Date  . Anemia   . Arthritis   . GERD (gastroesophageal reflux disease)   . Hypercholesteremia   . Hypertension   . Iron deficiency anemia due to chronic blood loss 03/17/2018  . Iron deficiency anemia due to chronic blood loss 03/17/2018  . Stroke ([place])   . Symptomatic anemia 01/20/2018  . Vitamin B deficiency    patient denies    SURGICAL HISTORY: Past Surgical History:  Procedure Laterality Date  . BIOPSY  06/24/2018   Procedure: BIOPSY;  Surgeon: Daneil Dolin, MD;  [facility];  Service: Endoscopy;;  gastric   . COLONOSCOPY N/A 06/24/2018   Dr. Gala Romney: Internal hemorrhoids, diverticulosis, 9 mm polyp removed from the sigmoid colon which was hyperplastic.  Marland Kitchen ESOPHAGOGASTRODUODENOSCOPY N/A 06/24/2018   Dr. Gala Romney: Multiple erosions in the stomach and duodenal bulb, gastric biopsies with chronic gastritis with  intestinal metaplasia, no H. pylori.  . ESOPHAGOGASTRODUODENOSCOPY (EGD) WITH PROPOFOL N/A 09/17/2018   Procedure: ESOPHAGOGASTRODUODENOSCOPY (EGD) WITH PROPOFOL;  Surgeon: Daneil Dolin, MD;  [facility];  Service: Endoscopy;  Laterality: N/A;  11:00am  . GIVENS CAPSULE STUDY N/A 09/15/2018   Procedure: GIVENS CAPSULE STUDY;   Surgeon: Daneil Dolin, MD;  [facility];  Service: Endoscopy;  Laterality: N/A;  7:30am  . none    . POLYPECTOMY  06/24/2018   Procedure: POLYPECTOMY;  Surgeon: Daneil Dolin, MD;  [facility];  Service: Endoscopy;;  sigmoid polyp hs    SOCIAL HISTORY: Social History   Socioeconomic History  . Marital status: Married    Spouse name: Ocie Tino  . Number of children: Not on file  . Years of education: Not on file  . Highest education level: 8th grade  Occupational History  . Occupation: retired  Tobacco Use  . Smoking status: Former Smoker    Packs/day: 0.50    Years: 67.00    Pack years: 33.50    Types: Cigarettes    Quit date: 12/11/2017    Years since quitting: 3.1  . Smokeless tobacco: Never Used  Vaping Use  . Vaping Use: Never used  Substance and Sexual Activity  . Alcohol use: No  . Drug use: No  . Sexual activity: Not Currently  Other Topics Concern  . Not on file  Social History Narrative   Lives with wife [name] in [place]   Dog named poppet      Enjoys hanging around [employer]: eats all food groups   Caffeine: coffee   Water: 4-5 cups daily      Wears seat belt    Does not use phone while driving    Oceanographer at home    Social Determinants of Health   Financial Resource Strain: Low Risk   . Difficulty of Paying Living Expenses: Not hard at all  Food Insecurity: No Food Insecurity  . Worried About Charity fundraiser in the Last Year: Never true  . Ran Out of Food in the Last Year: Never true  Transportation Needs: No Transportation Needs  . Lack of Transportation (Medical): No  . Lack of Transportation (Non-Medical): No  Physical Activity: Inactive  . Days of Exercise per Week: 0 days  . Minutes of Exercise per Session: 0 min  Stress: No Stress Concern Present  . Feeling of Stress : Only a little  Social Connections: Socially Isolated  . Frequency of Communication with Friends and Family: Once a week  .  Frequency of Social Gatherings with Friends and Family: Never  . Attends Religious Services: Never  . Active Member of Clubs or Organizations: No  . Attends Archivist Meetings: Never  . Marital Status: Married  Human resources officer Violence: Not At Risk  . Fear of Current or Ex-Partner: No  . Emotionally Abused: No  . Physically Abused: No  . Sexually Abused: No    FAMILY HISTORY: Family History  Problem Relation Age of Onset  . Colon cancer Neg Hx     ALLERGIES:  has No Known Allergies.  MEDICATIONS:  Current Outpatient Medications  Medication Sig Dispense Refill  . amLODipine (NORVASC) 5 MG tablet Take 1 tablet (5 mg total) by mouth daily. 30 tablet 5  . aspirin EC 81 MG tablet Take 81 mg by mouth daily.    Marland Kitchen atorvastatin (LIPITOR) 10 MG tablet Take  1 tablet (10 mg total) by mouth daily with supper. 90 tablet 3  . carvedilol (COREG) 12.5 MG tablet Take 1 tablet (12.5 mg total) by mouth 2 (two) times daily with a meal. 30 tablet 3  . cholecalciferol (VITAMIN D3) 25 MCG (1000 UNIT) tablet Take 1,000 Units by mouth daily.    . FEROSUL 325 (65 Fe) MG tablet TAKE 1 TABLET BY MOUTH EVERY DAY 60 tablet 5  . pantoprazole (PROTONIX) 40 MG tablet TAKE 1 TABLET BY MOUTH EVERY DAY 90 tablet 0  . silodosin (RAPAFLO) 8 MG CAPS capsule Take 1 capsule (8 mg total) by mouth daily with breakfast. 90 capsule 3  . vitamin C (ASCORBIC ACID) 500 MG tablet Take 500 mg by mouth daily.    . vitamin E 100 UNIT capsule Take 100 Units by mouth daily.      No current facility-administered medications for this visit.    REVIEW OF SYSTEMS:   Review of Systems  Constitutional: Positive for fatigue. Negative for appetite change, chills, diaphoresis, fever and unexpected weight change.  HENT:   Negative for lump/mass and nosebleeds.   Eyes: Negative for eye problems.  Respiratory: Positive for cough, hemoptysis and shortness of breath.   Cardiovascular: Negative for chest pain, leg swelling and  palpitations.  Gastrointestinal: Negative for abdominal pain, blood in stool, constipation, diarrhea, nausea and vomiting.  Genitourinary: Negative for hematuria.   Skin: Positive for itching and rash.  Neurological: Negative for dizziness, headaches and light-headedness.  Hematological: Does not bruise/bleed easily.     PHYSICAL EXAMINATION: ECOG PERFORMANCE STATUS: 1 - Symptomatic but completely ambulatory  There were no vitals filed for this visit. There were no vitals filed for this visit. Physical Exam Nursing note reviewed.  Constitutional:      Appearance: Normal appearance.  HENT:     Head: Normocephalic and atraumatic.     Mouth/Throat:     Mouth: Mucous membranes are moist.  Eyes:     Extraocular Movements: Extraocular movements intact.     Pupils: Pupils are equal, round, and reactive to light.  Cardiovascular:     Rate and Rhythm: Normal rate and regular rhythm.     Pulses: Normal pulses.     Heart sounds: Normal heart sounds.  Pulmonary:     Effort: Pulmonary effort is normal.     Breath sounds: Decreased air movement present. Decreased breath sounds present. No wheezing, rhonchi or rales.  Chest:     Chest wall: No tenderness or edema.  Breasts:     Breasts are asymmetrical (Left breast slihtly enlarged compared to right; no disrete lump or mass no nipple discharge).    Abdominal:     General: Bowel sounds are normal.     Palpations: Abdomen is soft.     Tenderness: There is no abdominal tenderness.  Musculoskeletal:        General: No swelling.     Right lower leg: No edema.     Left lower leg: No edema.  Lymphadenopathy:     Cervical: No cervical adenopathy.  Skin:    General: Skin is warm and dry.     Findings: Rash present.          Comments: Hyperpigmented plaque  Neurological:     General: No focal deficit present.     Mental Status: He is alert and oriented to person, place, and time.  Psychiatric:        Mood and Affect: Mood normal.  Behavior: Behavior normal.      LABORATORY DATA:  I have reviewed the data as listed CBC Latest Ref Rng & Units 01/17/2021 01/13/2021 08/18/2020  WBC 4.0 - 10.5 K/uL 5.8 6.4 6.7  Hemoglobin 13.0 - 17.0 g/dL 9.6(L) 10.8(L) 10.6(L)  Hematocrit 39.0 - 52.0 % 33.6(L) 37.5(L) 35.9(L)  Platelets 150 - 400 K/uL 352 405(H) 364   CMP Latest Ref Rng & Units 01/17/2021 01/13/2021 08/18/2020  Glucose 70 - 99 mg/dL 93 111(H) 100(H)  BUN 8 - 23 mg/dL _0 Creatinine 0.61 - 1.24 mg/dL 1.42(H) 1.36(H) 1.49(H)  Sodium 135 - 145 mmol/L 141 139 138  Potassium 3.5 - 5.1 mmol/L 4.4 4.3 4.7  Chloride 98 - 111 mmol/L 109 107 105  CO2 22 - 32 mmol/L _1 Calcium 8.9 - 10.3 mg/dL 9.1 9.4 9.4  Total Protein 6.5 - 8.1 g/dL 6.9 7.1 7.0  Total Bilirubin 0.3 - 1.2 mg/dL 0.3 0.3 0.4  Alkaline Phos 38 - 126 U/L 84 92 85  AST 15 - 41 U/L _2 ALT 0 - 44 U/L _3 RADIOGRAPHIC STUDIES: I have personally reviewed the radiological images as listed and agreed with the findings in the report. DG Chest 2 View  Addendum Date: 01/13/2021   ADDENDUM REPORT: 01/13/2021 17:47 ADDENDUM: These results were called by telephone at the time of interpretation on 01/13/2021 at 5:46 pm to provider [facility] , who verbally acknowledged these results. Electronically Signed   By: Zetta Bills M.D.   On: 01/13/2021 17:47   Result Date: 01/13/2021 CLINICAL DATA:  Hemoptysis, prior smoker. EXAM: CHEST - 2 VIEW COMPARISON:  None. FINDINGS: Trachea midline.  Cardiomediastinal contours are normal. Nodule projecting over the RIGHT suprahilar region measuring approximately 2.1 x 2.1 cm. No sign of effusion or lobar consolidative changes. On limited assessment no acute skeletal process. IMPRESSION: Nodule projecting over the RIGHT suprahilar region measuring approximately 2.1 x 2.1 cm. This is suspicious for neoplasm. CT of the chest is suggested for further evaluation. Electronically Signed: By: Zetta Bills M.D.  On: 01/13/2021 17:43   CT Angio Chest PE W/Cm &/Or Wo Cm  Result Date: 01/13/2021 CLINICAL DATA:  Hematemesis. EXAM: CT ANGIOGRAPHY CHEST WITH CONTRAST TECHNIQUE: Multidetector CT imaging of the chest was performed using the standard protocol during bolus administration of intravenous contrast. Multiplanar CT image reconstructions and MIPs were obtained to evaluate the vascular anatomy. CONTRAST:  59m OMNIPAQUE IOHEXOL 350 MG/ML SOLN COMPARISON:  None. FINDINGS: Cardiovascular: There is moderate severity calcification of the thoracic aorta, without evidence of aneurysmal dilatation or dissection. Satisfactory opacification of the pulmonary arteries to the segmental level. No evidence of pulmonary embolism. Normal heart size with moderate to marked severity coronary artery calcification. A small anterior pericardial effusion is seen. Mediastinum/Nodes: No enlarged mediastinal, hilar, or axillary lymph nodes. A calcified cystic appearing areas seen within the anteromedial aspect of the left lobe of the thyroid gland. The trachea and esophagus demonstrate no significant findings. Lungs/Pleura: Mild emphysematous lung disease is seen within the bilateral upper lobes. 2.8 cm x 1.7 cm and 3.1 cm x 2.3 cm heterogeneous noncalcified lung masses are seen within the inferior medial aspect of the left upper lobe. There is no evidence of acute infiltrate, pleural effusion or pneumothorax. Upper Abdomen: No acute abnormality. Musculoskeletal: Degenerative changes seen throughout the thoracic spine. Review of the MIP images confirms the above findings. IMPRESSION: 1. Heterogeneous, noncalcified right upper lobe lung masses, concerning  for the presence of an underlying neoplastic process. Correlation with nuclear medicine PET/CT is recommended. 2. Small anterior pericardial effusion. 3. Mild emphysematous lung disease. 4. Aortic atherosclerosis. Aortic Atherosclerosis (ICD10-I70.0). Electronically Signed   By: Virgina Norfolk M.D.   On: 01/13/2021 18:59    ASSESSMENT:  1.  Right lung masses concerning for possible malignancy -Presented to emergency department on 01/13/21 with hemoptysis -CT angiography of chest on 01/13/21 showed heterogeneous noncalcified right upper lob lung masses, measuring 2.8 cm x 1.7 cm and 3.1 cm x 2.3 cm, which are concerning for underlying neoplastic process.  (These were not present on CT chest with contrast in September 2019) -Patient admits to worsening fatigue, but denies unexpected weight loss and other B-symptoms -No family history of lung cancer -50-pack year history of tobacco use, quit smoking in 2012  2.  Iron deficiency anemia due to chronic blood loss and malabsorption -Likely from combination of chronic GI blood loss and malabsorption, as well as anemia in the setting of possible malignancy (see above) -EGD on 09/17/2018 shows normal esophagus, blood in the entire stomach, 3 actively bleeding AVMs versus Dieulafoy's sealed with a clips.  Normal duodenal bulb and second part of duodenum. -Colonoscopy on 06/24/2018 showed internal hemorrhoids, few medium mouth diverticula in the entire colon.  Subcentimeter polyp in the sigmoid colon. -Bone marrow biopsy on 08/21/2018 showed normocellular marrow with erythroid hyperplasia.  No increase in plasma cells. -Denies any bright red bleeding per rectum or melena. -Last IV iron was October 2021 -Has been taking oral iron, but with no improvement in iron saturation, likely poor absorption in the setting of concurrent PPI use -Patient is taking daily aspirin 81 mg, but is uncertain of the reason why.  He denies coronary artery disease, atrial fibrillation, and stroke.  However, review of brain imaging does show signs of old infarcts. -Review of labs from 01/17/2021 show hemoglobin 9.6, ferritin 37, and serum iron 40/saturation 11%.  3.Rash -Intermittent hyperpigmented plaque/rash that is pruritic, ongoing for the past 2  years -Patient has not yet been to dermatologist, reports that he has spoken to various providers but has not gotten answers about his rash -Causing patient significant discomfort  PLAN:  1. Lung masses concerning for possible malignancy -CT angiography of chest on 01/13/21 showed heterogeneous noncalcified right upper lobe lung masses, measuring 2.8 cm x 1.7 cm and 3.1 cm x 2.3 cm, which are concerning for underlying neoplastic process.  (These were not present on CT chest with contrast in September 2019) -Obtain MRI brain, PET scan -Return to clinic in 3 to 4 weeks to discuss results and determine the next best step in work-up and treatment  2.  Iron deficiency anemia due to chronic blood loss and malabsorption -Likely from combination of chronic GI blood loss and malabsorption, as well as anemia in the setting of possible malignancy (see above) -Recommend 2 doses IV Feraheme scheduled 1 week apart, goal ferritin 100 -Repeat CBC and iron labs in 8 weeks -Stop taking iron pill, as it is not effective due to malabsorption  3.  Rash -Referral sent to dermatology   SUMMARY AND DISPOSITION: -Check MRI brain -Get PET scan.  RTC after PET scan to discuss biopsy options (IR versus navigational bronchoscopy). -2 doses IV Feraheme, 1 week apart -Repeat CBC and iron labs in 8 weeks -Discontinue oral iron and aspirin -Return to clinic in 3 to 4 weeks  Addendum: -Reviewed MRI of the brain with and without contrast which showed punctate focus  of enhancement in the left precentral gyrus favored to be artifactual although small metastatic focus cannot be ruled out.  He does not have any symptoms.  Will consider repeat imaging in 6 to 8 weeks.  All questions were answered. The patient knows to call the clinic with any problems, questions or concerns.   I, Tarri Abernethy PA-C, have seen this patient in conjunction with Dr. Derek Jack.  Greater than 50% of visit was performed by Dr.  Delton Coombes.  I, Derek Jack MD, have reviewed the above documentation for accuracy and completeness, and I agree with the above.     Derek Jack, MD, 01/18/21 10:02 AM  [place] [phone]

## 2021-01-18 NOTE — Patient Instructions (Signed)
[place] at [facility] Discharge Instructions  You were seen today by Tarri Abernethy PA-C and Dr. Delton Coombes for your iron deficiency anemia and new lung masses.  We will order IV iron for your anemia. We will order special tests for your lung masses to determine if they are cancerous and to decide on future treatment plan.  UPCOMING TESTS: We will schedule you for the following tests: - MRI of your brain - PET scan  MEDICATIONS: We will schedule you for IV iron infusions AFTER your brain MRI. - STOP taking aspirin and iron pills.  FOLLOW-UP APPOINTMENT: Return to clinic for an appointment with Dr. Delton Coombes in 2-3 weeks to discuss the results of your PET scan. - We will also refer you to dermatology for your rash.  WARNING SIGNS: Come to the emergency department if you have multiple episodes of coughing up blood, or if you have a single episode of coughing up blood with large amounts of blood loss, or if you feel worsening shortness of breath, chest pain, or any other symptoms that concern you.  Thank you for choosing [place] at [facility] to provide your oncology and hematology care.  To afford each patient quality time with our provider, please arrive at least 15 minutes before your scheduled appointment time.   If you have a lab appointment with the [facility] please come in thru the Main Entrance and check in at the main information desk.  You need to re-schedule your appointment should you arrive 10 or more minutes late.  We strive to give you quality time with our providers, and arriving late affects you and other patients whose appointments are after yours.  Also, if you no show three or more times for appointments you may be dismissed from the clinic at the providers discretion.     Again, thank you for choosing [facility].  Our hope is that these requests will decrease the amount of time that you wait before  being seen by our physicians.       _____________________________________________________________  Should you have questions after your visit to [facility], please contact our office at [phone] and follow the prompts.  Our office hours are 8:00 a.m. and 4:30 p.m. Monday - Friday.  Please note that voicemails left after 4:00 p.m. may not be returned until the following business day.  We are closed weekends and major holidays.  You do have access to a nurse 24-7, just call the main number to the clinic [phone] and do not press any options, hold on the line and a nurse will answer the phone.    For prescription refill requests, have your pharmacy contact our office and allow 72 hours.    Due to Covid, you will need to wear a mask upon entering the hospital. If you do not have a mask, a mask will be given to you at the Main Entrance upon arrival. For doctor visits, patients may have 1 support person age 26 or older with them. For treatment visits, patients can not have anyone with them due to social distancing guidelines and our immunocompromised population.

## 2021-01-24 ENCOUNTER — Ambulatory Visit (HOSPITAL_COMMUNITY): Payer: PPO | Admitting: Hematology

## 2021-01-24 ENCOUNTER — Encounter: Payer: PPO | Admitting: Nurse Practitioner

## 2021-01-24 DIAGNOSIS — L308 Other specified dermatitis: Secondary | ICD-10-CM | POA: Diagnosis not present

## 2021-01-25 ENCOUNTER — Ambulatory Visit (HOSPITAL_COMMUNITY)
Admission: RE | Admit: 2021-01-25 | Discharge: 2021-01-25 | Disposition: A | Discharge status: Home / Self Care | Payer: PPO | Source: Ambulatory Visit | Attending: Physician Assistant | Admitting: Physician Assistant

## 2021-01-25 ENCOUNTER — Other Ambulatory Visit: Payer: Self-pay

## 2021-01-25 DIAGNOSIS — C801 Malignant (primary) neoplasm, unspecified: Secondary | ICD-10-CM | POA: Insufficient documentation

## 2021-01-25 DIAGNOSIS — I714 Abdominal aortic aneurysm, without rupture: Secondary | ICD-10-CM | POA: Diagnosis not present

## 2021-01-25 DIAGNOSIS — Z8673 Personal history of transient ischemic attack (TIA), and cerebral infarction without residual deficits: Secondary | ICD-10-CM | POA: Diagnosis not present

## 2021-01-25 DIAGNOSIS — J439 Emphysema, unspecified: Secondary | ICD-10-CM | POA: Diagnosis not present

## 2021-01-25 DIAGNOSIS — I2510 Atherosclerotic heart disease of native coronary artery without angina pectoris: Secondary | ICD-10-CM | POA: Diagnosis not present

## 2021-01-25 DIAGNOSIS — N400 Enlarged prostate without lower urinary tract symptoms: Secondary | ICD-10-CM | POA: Diagnosis not present

## 2021-01-25 DIAGNOSIS — D3502 Benign neoplasm of left adrenal gland: Secondary | ICD-10-CM | POA: Insufficient documentation

## 2021-01-25 DIAGNOSIS — M47816 Spondylosis without myelopathy or radiculopathy, lumbar region: Secondary | ICD-10-CM | POA: Diagnosis not present

## 2021-01-25 DIAGNOSIS — I700 Atherosclerosis of aorta: Secondary | ICD-10-CM | POA: Insufficient documentation

## 2021-01-25 DIAGNOSIS — N261 Atrophy of kidney (terminal): Secondary | ICD-10-CM | POA: Diagnosis not present

## 2021-01-25 DIAGNOSIS — C3411 Malignant neoplasm of upper lobe, right bronchus or lung: Secondary | ICD-10-CM | POA: Diagnosis not present

## 2021-01-25 DIAGNOSIS — R918 Other nonspecific abnormal finding of lung field: Secondary | ICD-10-CM | POA: Diagnosis not present

## 2021-01-25 LAB — GLUCOSE, CAPILLARY: Glucose-Capillary: 90 mg/dL (ref 70–99)

## 2021-01-25 MED ORDER — FLUDEOXYGLUCOSE F - 18 (FDG) INJECTION
7.4000 | Freq: Once | INTRAVENOUS | Status: AC
  Administered 2021-01-25: 7.12 via INTRAVENOUS

## 2021-01-26 ENCOUNTER — Ambulatory Visit (HOSPITAL_COMMUNITY): Payer: PPO

## 2021-01-26 ENCOUNTER — Inpatient Hospital Stay (HOSPITAL_COMMUNITY): Payer: PPO

## 2021-01-26 VITALS — BP 145/66 | HR 68 | Temp 97.1°F | Resp 18 | Wt 141.5 lb

## 2021-01-26 DIAGNOSIS — C3411 Malignant neoplasm of upper lobe, right bronchus or lung: Secondary | ICD-10-CM | POA: Diagnosis not present

## 2021-01-26 DIAGNOSIS — D500 Iron deficiency anemia secondary to blood loss (chronic): Secondary | ICD-10-CM

## 2021-01-26 MED ORDER — SODIUM CHLORIDE 0.9 % IV SOLN
510.0000 mg | Freq: Once | INTRAVENOUS | Status: AC
  Administered 2021-01-26: 510 mg via INTRAVENOUS
  Filled 2021-01-26: qty 510

## 2021-01-26 MED ORDER — SODIUM CHLORIDE 0.9 % IV SOLN: Freq: Once | INTRAVENOUS | Status: AC

## 2021-01-26 NOTE — Progress Notes (Signed)
Patient presents today for IV Feraheme infusion. Vital signs are stable. Patient denies any changes since the last iron infusion. Patient denies any complaints today. MAR reviewed and updated.   Feraheme given today per MD orders. Tolerated infusion without adverse affects. Vital signs stable. No complaints at this time. Discharged from clinic ambulatory in stable condition. Alert and oriented x 3. F/U with [facility] as scheduled.

## 2021-01-26 NOTE — Patient Instructions (Signed)
[place] at [facility]  Discharge Instructions: Feraheme _______________________________________________________________  Thank you for choosing [place] at [facility] to provide your oncology and hematology care.  To afford each patient quality time with our providers, please arrive at least 15 minutes before your scheduled appointment.  You need to re-schedule your appointment if you arrive 10 or more minutes late.  We strive to give you quality time with our providers, and arriving late affects you and other patients whose appointments are after yours.  Also, if you no show three or more times for appointments you may be dismissed from the clinic.  Again, thank you for choosing [place] at [place] hope is that these requests will allow you access to exceptional care and in a timely manner. _______________________________________________________________  If you have questions after your visit, please contact our office at [phone] between the hours of 8:30 a.m. and 5:00 p.m. Voicemails left after 4:30 p.m. will not be returned until the following business day. _______________________________________________________________  For prescription refill requests, have your pharmacy contact our office. _______________________________________________________________  Recommendations made by the consultant and any test results will be sent to your referring physician. _______________________________________________________________

## 2021-01-29 ENCOUNTER — Other Ambulatory Visit: Payer: Self-pay

## 2021-01-29 ENCOUNTER — Encounter (HOSPITAL_COMMUNITY): Payer: Self-pay | Admitting: Hematology

## 2021-01-29 ENCOUNTER — Inpatient Hospital Stay (HOSPITAL_COMMUNITY): Payer: PPO

## 2021-01-29 ENCOUNTER — Inpatient Hospital Stay (HOSPITAL_BASED_OUTPATIENT_CLINIC_OR_DEPARTMENT_OTHER): Payer: PPO | Admitting: Hematology

## 2021-01-29 VITALS — BP 142/71 | HR 96 | Temp 96.8°F | Resp 20 | Wt 138.4 lb

## 2021-01-29 DIAGNOSIS — R918 Other nonspecific abnormal finding of lung field: Secondary | ICD-10-CM

## 2021-01-29 DIAGNOSIS — C3411 Malignant neoplasm of upper lobe, right bronchus or lung: Secondary | ICD-10-CM | POA: Diagnosis not present

## 2021-01-29 LAB — PSA: Prostatic Specific Antigen: 0.51 ng/mL (ref 0.00–4.00)

## 2021-01-29 NOTE — Patient Instructions (Addendum)
[place] at [facility] Discharge Instructions  You were seen today by Dr. Delton Coombes and Tarri Abernethy PA-C for your suspected lung cancer.  Before discussing further treatment options, you will need further testing to determine the type of cancer.    LABS: Check PSA in the lab on your way out of the hospital today.  OTHER TESTS:  - Make an appointment for PFT's (Pulmonary Function Tests) in [place] before your visit with Dr. Roxan Hockey. - Make an appointment with Dr. Roxan Hockey ([place] surgeon) in [place] for a biopsy of your lung mass.  MEDICATIONS: No changes to your previously prescribed medications.  FOLLOW-UP APPOINTMENT: Return to the [facility] for an appointment with Dr. Delton Coombes (in about 4-6 weeks) after you have had your biopsy with Dr. Roxan Hockey.  Thank you for choosing [place] at [facility] to provide your oncology and hematology care.  To afford each patient quality time with our provider, please arrive at least 15 minutes before your scheduled appointment time.   If you have a lab appointment with the [facility] please come in thru the Main Entrance and check in at the main information desk.  You need to re-schedule your appointment should you arrive 10 or more minutes late.  We strive to give you quality time with our providers, and arriving late affects you and other patients whose appointments are after yours.  Also, if you no show three or more times for appointments you may be dismissed from the clinic at the providers discretion.     Again, thank you for choosing [facility].  Our hope is that these requests will decrease the amount of time that you wait before being seen by our physicians.       _____________________________________________________________  Should you have questions after your visit to [facility], please contact our office at [phone] and  follow the prompts.  Our office hours are 8:00 a.m. and 4:30 p.m. Monday - Friday.  Please note that voicemails left after 4:00 p.m. may not be returned until the following business day.  We are closed weekends and major holidays.  You do have access to a nurse 24-7, just call the main number to the clinic [phone] and do not press any options, hold on the line and a nurse will answer the phone.    For prescription refill requests, have your pharmacy contact our office and allow 72 hours.    Due to Covid, you will need to wear a mask upon entering the hospital. If you do not have a mask, a mask will be given to you at the Main Entrance upon arrival. For doctor visits, patients may have 1 support person age 83 or older with them. For treatment visits, patients can not have anyone with them due to social distancing guidelines and our immunocompromised population.

## 2021-01-29 NOTE — Progress Notes (Signed)
Jared Schmidt, Jared Schmidt [id number]   CLINIC:  Medical Oncology/Hematology  PCP:  Jared Mayo, NP Jared Schmidt [phone]   REASON FOR VISIT:  Follow-up for newly diagnosed lung cancer, currently under workup  PRIOR THERAPY: None  CURRENT THERAPY: Under workup  INTERVAL HISTORY:  Jared Schmidt 85 y.o. male returns to discuss the results of his recent PET scan and MRI, and to discuss the next steps in the workup and treatment of his right-sided lung masses.  He was last seen on 01/18/2021.  MRI brain on 01/18/2021 showed a punctate focus of enhancement in the left precentral gyrus, which is favored by radiologist to favor artifact, but follow-up MRI recommended in 6 months to exclude small metastasis.    PET scan was performed on 01/25/2021 - both right upper lobe lung masses are hypermetabolic and consistent with malignancy, with a mildly hypermetabolic but small right hilar lymph node, probably involved; faintly accentuated metabolic of right paratracheal lymph node.  No findings of extra thoracic metastatic disease on PET scan.  Prostatomegaly was noted, with accentuated activity consistent with inflammation, although correlation with a PSA level is suggested.  Incidental findings include infrarenal abdominal aortic aneurysm (3.6 cm, recommended for follow-up US ever 2 years); remote left posterior cerebellar infarct; emphysema; coronary atherosclerosis; left adrenal adenoma.  Since his last visit, he reports that he is doing relatively well.  He has not had any more hemoptysis.  He did have an episode of recent head cold with sinus pressure and dry cough.  He denies shortness of breath at rest.  No shortness of breath with exertion.  He reports stable/unchanged fatigue.  Regarding prostatomegaly and accentuated activity noted on PET scan, patient does see Dr. Noah Schmidt for BPH.  He denies any new LUTS, no dysuria, difficulty  urinating, or recent prostate infections.  Hedenies B-symptoms such as fever, chills, weight loss, and night sweats. No new masses or lymphadenopathy per hisreport.  Patient reports appetite at100% and energy level at50%.Heis maintaining stable weight at this time.  REVIEW OF SYSTEMS:  Review of Systems  Constitutional: Positive for fatigue. Negative for appetite change, chills, diaphoresis, fever and unexpected weight change.  HENT:   Negative for lump/mass and nosebleeds.   Eyes: Negative for eye problems.  Respiratory: Positive for cough. Negative for hemoptysis and shortness of breath.   Cardiovascular: Negative for chest pain, leg swelling and palpitations.  Gastrointestinal: Negative for abdominal pain, blood in stool, constipation, diarrhea, nausea and vomiting.  Genitourinary: Negative for hematuria.   Skin: Negative.   Neurological: Negative for dizziness, headaches and light-headedness.  Hematological: Does not bruise/bleed easily.      PAST MEDICAL/SURGICAL HISTORY:  Past Medical History:  Diagnosis Date  . Anemia   . Arthritis   . GERD (gastroesophageal reflux disease)   . Hypercholesteremia   . Hypertension   . Iron deficiency anemia due to chronic blood loss 03/17/2018  . Iron deficiency anemia due to chronic blood loss 03/17/2018  . Stroke ([place])   . Symptomatic anemia 01/20/2018  . Vitamin B deficiency    patient denies   Past Surgical History:  Procedure Laterality Date  . BIOPSY  06/24/2018   Procedure: BIOPSY;  Surgeon: Jared Dolin, MD;  [facility];  Service: Endoscopy;;  gastric   . COLONOSCOPY N/A 06/24/2018   Dr. Gala Schmidt: Internal hemorrhoids, diverticulosis, 9 mm polyp removed from the sigmoid colon which was hyperplastic.  Marland Kitchen ESOPHAGOGASTRODUODENOSCOPY  N/A 06/24/2018   Dr. Gala Schmidt: Multiple erosions in the stomach and duodenal bulb, gastric biopsies with chronic gastritis with intestinal metaplasia, no H. pylori.  .  ESOPHAGOGASTRODUODENOSCOPY (EGD) WITH PROPOFOL N/A 09/17/2018   Procedure: ESOPHAGOGASTRODUODENOSCOPY (EGD) WITH PROPOFOL;  Surgeon: Jared Dolin, MD;  [facility];  Service: Endoscopy;  Laterality: N/A;  11:00am  . GIVENS CAPSULE STUDY N/A 09/15/2018   Procedure: GIVENS CAPSULE STUDY;  Surgeon: Jared Dolin, MD;  [facility];  Service: Endoscopy;  Laterality: N/A;  7:30am  . none    . POLYPECTOMY  06/24/2018   Procedure: POLYPECTOMY;  Surgeon: Jared Dolin, MD;  [facility];  Service: Endoscopy;;  sigmoid polyp hs     SOCIAL HISTORY:  Social History   Socioeconomic History  . Marital status: Married    Spouse name: Jared Schmidt  . Number of children: Not on file  . Years of education: Not on file  . Highest education level: 8th grade  Occupational History  . Occupation: retired  Tobacco Use  . Smoking status: Former Smoker    Packs/day: 0.50    Years: 67.00    Pack years: 33.50    Types: Cigarettes    Quit date: 12/11/2017    Years since quitting: 3.1  . Smokeless tobacco: Never Used  Vaping Use  . Vaping Use: Never used  Substance and Sexual Activity  . Alcohol use: No  . Drug use: No  . Sexual activity: Not Currently  Other Topics Concern  . Not on file  Social History Narrative   Lives with wife Jared Schmidt in [place]   Dog named poppet      Enjoys hanging around [employer]: eats all food groups   Caffeine: coffee   Water: 4-5 cups daily      Wears seat belt    Does not use phone while driving    Oceanographer at home    Social Determinants of Health   Financial Resource Strain: Low Risk   . Difficulty of Paying Living Expenses: Not hard at all  Food Insecurity: No Food Insecurity  . Worried About Charity fundraiser in the Last Year: Never true  . Ran Out of Food in the Last Year: Never true  Transportation Needs: No Transportation Needs  . Lack of Transportation (Medical): No  . Lack of Transportation  (Non-Medical): No  Physical Activity: Inactive  . Days of Exercise per Week: 0 days  . Minutes of Exercise per Session: 0 min  Stress: No Stress Concern Present  . Feeling of Stress : Only a little  Social Connections: Socially Isolated  . Frequency of Communication with Friends and Family: Once a week  . Frequency of Social Gatherings with Friends and Family: Never  . Attends Religious Services: Never  . Active Member of Clubs or Organizations: No  . Attends Archivist Meetings: Never  . Marital Status: Married  Human resources officer Violence: Not At Risk  . Fear of Current or Ex-Partner: No  . Emotionally Abused: No  . Physically Abused: No  . Sexually Abused: No    FAMILY HISTORY:  Family History  Problem Relation Age of Onset  . Colon cancer Neg Hx     CURRENT MEDICATIONS:  Outpatient Encounter Medications as of 01/29/2021  Medication Sig  . amLODipine (NORVASC) 5 MG tablet Take 1 tablet (5 mg total) by mouth daily.  Marland Kitchen atorvastatin (LIPITOR) 10 MG tablet Take  1 tablet (10 mg total) by mouth daily with supper.  . carvedilol (COREG) 12.5 MG tablet Take 1 tablet (12.5 mg total) by mouth 2 (two) times daily with a meal.  . cholecalciferol (VITAMIN D3) 25 MCG (1000 UNIT) tablet Take 1,000 Units by mouth daily.  . pantoprazole (PROTONIX) 40 MG tablet TAKE 1 TABLET BY MOUTH EVERY DAY  . silodosin (RAPAFLO) 8 MG CAPS capsule Take 1 capsule (8 mg total) by mouth daily with breakfast.  . vitamin C (ASCORBIC ACID) 500 MG tablet Take 500 mg by mouth daily.  . vitamin E 100 UNIT capsule Take 100 Units by mouth daily.    No facility-administered encounter medications on file as of 01/29/2021.    ALLERGIES:  No Known Allergies   PHYSICAL EXAM:  ECOG PERFORMANCE STATUS: 1 - Symptomatic but completely ambulatory  Vitals:   01/29/21 1321  BP: (!) 142/71  Pulse: 96  Resp: 20  Temp: (!) 96.8 F (36 C)  SpO2: 98%   Filed Weights   01/29/21 1321  Weight: 138 lb 7.2 oz  (62.8 kg)   Physical Exam Constitutional:      Appearance: Normal appearance.  HENT:     Head: Normocephalic and atraumatic.     Mouth/Throat:     Mouth: Mucous membranes are moist.  Eyes:     Extraocular Movements: Extraocular movements intact.     Pupils: Pupils are equal, round, and reactive to light.  Cardiovascular:     Rate and Rhythm: Normal rate and regular rhythm.     Pulses: Normal pulses.     Heart sounds: Normal heart sounds.  Pulmonary:     Effort: Pulmonary effort is normal.     Breath sounds: Decreased air movement present. Decreased breath sounds present. No wheezing or rhonchi.  Abdominal:     General: Bowel sounds are normal.     Palpations: Abdomen is soft.     Tenderness: There is no abdominal tenderness.  Musculoskeletal:        General: No swelling.     Right lower leg: No edema.     Left lower leg: No edema.  Lymphadenopathy:     Cervical: No cervical adenopathy.  Skin:    General: Skin is warm and dry.  Neurological:     General: No focal deficit present.     Mental Status: He is alert and oriented to person, place, and time.  Psychiatric:        Mood and Affect: Mood normal.        Behavior: Behavior normal.      LABORATORY DATA:  I have reviewed the labs as listed.  CBC    Component Value Date/Time   WBC 5.8 01/17/2021 1356   RBC 3.94 (L) 01/17/2021 1356   HGB 9.6 (L) 01/17/2021 1356   HCT 33.6 (L) 01/17/2021 1356   PLT 352 01/17/2021 1356   MCV 85.3 01/17/2021 1356   MCH 24.4 (L) 01/17/2021 1356   MCHC 28.6 (L) 01/17/2021 1356   RDW 17.3 (H) 01/17/2021 1356   LYMPHSABS 0.5 (L) 01/17/2021 1356   MONOABS 0.8 01/17/2021 1356   EOSABS 0.2 01/17/2021 1356   BASOSABS 0.0 01/17/2021 1356   CMP Latest Ref Rng & Units 01/17/2021 01/13/2021 08/18/2020  Glucose 70 - 99 mg/dL 93 111(H) 100(H)  BUN 8 - 23 mg/dL '20 21 20  ' Creatinine 0.61 - 1.24 mg/dL 1.42(H) 1.36(H) 1.49(H)  Sodium 135 - 145 mmol/L 141 139 138  Potassium 3.5 - 5.1 mmol/L 4.4  4.3 4.7  Chloride 98 - 111 mmol/L 109 107 105  CO2 22 - 32 mmol/L '23 24 26  ' Calcium 8.9 - 10.3 mg/dL 9.1 9.4 9.4  Total Protein 6.5 - 8.1 g/dL 6.9 7.1 7.0  Total Bilirubin 0.3 - 1.2 mg/dL 0.3 0.3 0.4  Alkaline Phos 38 - 126 U/L 84 92 85  AST 15 - 41 U/L '21 24 23  ' ALT 0 - 44 U/L '13 15 15    ' DIAGNOSTIC IMAGING:  I have independently reviewed the relevant imaging and discussed with the patient.  ASSESSMENT:  1.  Right lung masses concerning for possible malignancy -Presented to emergency department on 01/13/21 with hemoptysis -CT angiography of chest on 01/13/21 showed heterogeneous noncalcified right upper lob lung masses,measuring 2.8 cm x 1.7 cm and 3.1 cm x 2.3 cm, which are concerning for underlying neoplastic process.  (These were not present on CT chest with contrast in September 2019) -PET scan was performed on 01/25/2021 - both right upper lobe lung masses are hypermetabolic and consistent with malignancy, with a mildly hypermetabolic but small right hilar lymph node, probably involved; faintly accentuated metabolic of right paratracheal lymph node.  No findings of extra thoracic metastatic disease on PET scan. -Prostatomegaly was noted, with accentuated activity consistent with inflammation, although correlation with a PSA level is suggested. -MRI brain on 01/18/2021 showed a punctate focus of enhancement in the left precentral gyrus, which is favored by radiologist to favor artifact, but follow-up MRI recommended to exclude metastasis -Patient admits to worsening fatigue and intermittent hemoptysis , but denies unexpected weight loss and other B-symptoms -No family history of lung cancer -50-pack year history of tobacco use, quit smoking in 2012  2.  Iron deficiency anemia due to chronic blood loss and malabsorption -Likely from combination of chronic GI blood loss and malabsorption, as well as anemia in the setting of possible malignancy (see above) -EGD on 09/17/2018 shows normal  esophagus, blood in the entire stomach, 3 actively bleeding AVMs versus Dieulafoy's sealed with a clips. Normal duodenal bulb and second part of duodenum. -Colonoscopy on 06/24/2018 showed internal hemorrhoids, few medium mouth diverticula in the entire colon. Subcentimeter polyp in the sigmoid colon. -Bone marrow biopsy on 08/21/2018 showed normocellular marrow with erythroid hyperplasia. No increase in plasma cells. -Denies any bright red bleeding per rectum or melena. -Has been taking oral iron, but with no improvement in iron saturation, likely poor absorption in the setting of concurrent PPI use -Patient is taking daily aspirin 81 mg, but is uncertain of the reason why.  He denies coronary artery disease, atrial fibrillation, and stroke.  However, review of brain imaging does show signs of old infarcts. -Review of labs from 01/17/2021 show hemoglobin 9.6, ferritin 37, and serum iron 40, saturation 11%. -Scheduled for IV Feraheme x 2 at last appointment (01/18/2021)  3. Health maintenance  -Incidental findings on PET scan include infrarenal abdominal aortic aneurysm (3.6 cm, recommended for follow-up US ever 2 years; remote left posterior cerebellar infarct; emphysema; coronary atherosclerosis; left adrenal adenoma.   PLAN:  1. Lung masses concerning for possible malignancy -CT angiography of chest on 01/13/21 and PET scan on 01/25/2021 consistent with lung malignancy, primary source/type not yet confirmed -Consider repeat MRI in 6-8 weeks to exclude metastasis in light of findings as above -Refer to Dr. Roxan Hockey (CT surgeon) for navigational biopsy and consideration of lobectomy -Refer for PFT's prior to appointment with Dr. Roxan Hockey -Check PSA to correlate with prostate enhancement on PET scan - consider referral  to urologist (Sees Dr. Nicolette Bang in [place]) -Return to clinic in 4 to 6 weeks for appointment with Dr. Delton Coombes, ideally after biopsy of lung masses has been  obtained  2.  Iron deficiency anemia due to chronic blood loss and malabsorption -Continue IV Feraheme, as ordered at last visit -Repeat CBC and iron labs in 8 weeks  3. Health maintenance -Refer back to PCP for ongoing management of incidental findings on PET scan, including infrarenal abdominal aortic aneurysm (3.6 cm, recommended for follow-up US ever 2 years); remote left posterior cerebellar infarct; emphysema; coronary atherosclerosis; left adrenal adenoma.   PLAN SUMMARY & DISPOSITION: -Referral for PFTs -Referral to Dr. Roxan Hockey (CT surgeon) for bronchoscopy and biopsy of lung mass and evaluation for possible surgical resection. -Check PSA due to prostate abnormalities on PET scan -RTC in 4 to 6 weeks for appointment with Dr. Delton Coombes  All questions were answered. The patient knows to call the clinic with any problems, questions or concerns.  Medical decision making: Moderate (new illness under work-up, review of PET scan and images, ordering of PFTs and new labs)  Time spent on visit: I spent 30 minutes counseling the patient face to face. The total time spent in the appointment was 40 minutes and more than 50% was on counseling.  I, Tarri Abernethy PA-C, have seen this patient in conjunction with Dr. Derek Jack.  Greater than 50% of visit was performed by Dr. Delton Coombes.  I have independently assessed this patient and agree with the note written by Tarri Abernethy, PA-C.  He did not have any further hemoptysis since last visit.  Patient is agreeable to surgery and does not want any chemo or radiation.  We will seek opinion of Dr. Roxan Hockey for navigational bronchoscopy and biopsy and make further plan.  Derek Jack, MD  01/29/21 6:17 PM

## 2021-02-02 ENCOUNTER — Other Ambulatory Visit: Payer: Self-pay

## 2021-02-02 ENCOUNTER — Inpatient Hospital Stay (HOSPITAL_COMMUNITY): Payer: PPO | Attending: Hematology

## 2021-02-02 ENCOUNTER — Encounter (HOSPITAL_COMMUNITY): Payer: Self-pay

## 2021-02-02 ENCOUNTER — Ambulatory Visit (HOSPITAL_COMMUNITY): Payer: PPO

## 2021-02-02 VITALS — BP 138/85 | HR 90 | Temp 97.0°F | Resp 18

## 2021-02-02 DIAGNOSIS — D509 Iron deficiency anemia, unspecified: Secondary | ICD-10-CM | POA: Insufficient documentation

## 2021-02-02 DIAGNOSIS — D500 Iron deficiency anemia secondary to blood loss (chronic): Secondary | ICD-10-CM

## 2021-02-02 MED ORDER — SODIUM CHLORIDE 0.9 % IV SOLN
510.0000 mg | Freq: Once | INTRAVENOUS | Status: AC
  Administered 2021-02-02: 510 mg via INTRAVENOUS
  Filled 2021-02-02: qty 510

## 2021-02-02 MED ORDER — SODIUM CHLORIDE 0.9 % IV SOLN: Freq: Once | INTRAVENOUS | Status: AC

## 2021-02-02 NOTE — Patient Instructions (Signed)
[place] at [facility]  Discharge Instructions:  Feraheme _______________________________________________________________  Thank you for choosing [place] at [facility] to provide your oncology and hematology care.  To afford each patient quality time with our providers, please arrive at least 15 minutes before your scheduled appointment.  You need to re-schedule your appointment if you arrive 10 or more minutes late.  We strive to give you quality time with our providers, and arriving late affects you and other patients whose appointments are after yours.  Also, if you no show three or more times for appointments you may be dismissed from the clinic.  Again, thank you for choosing [place] at [place] hope is that these requests will allow you access to exceptional care and in a timely manner. _______________________________________________________________  If you have questions after your visit, please contact our office at [phone] between the hours of 8:30 a.m. and 5:00 p.m. Voicemails left after 4:30 p.m. will not be returned until the following business day. _______________________________________________________________  For prescription refill requests, have your pharmacy contact our office. _______________________________________________________________  Recommendations made by the consultant and any test results will be sent to your referring physician. _______________________________________________________________

## 2021-02-02 NOTE — Progress Notes (Signed)
Patient presents today for Feraheme infusion. Vital signs are stable. Patient denies any changes since the last iron infusion. Patient denies any complaints today. MAR reviewed and updated.   Feraheme given today per MD orders. Tolerated infusion without adverse affects. Vital signs stable. No complaints at this time. Discharged from clinic ambulatory in stable condition. Alert and oriented x 3. F/U with [facility] as scheduled.

## 2021-02-05 ENCOUNTER — Telehealth: Payer: Self-pay

## 2021-02-05 ENCOUNTER — Other Ambulatory Visit: Payer: Self-pay

## 2021-02-05 MED ORDER — ATORVASTATIN CALCIUM 10 MG PO TABS: 10.0000 mg | ORAL_TABLET | Freq: Every day | ORAL | 3 refills | Status: DC

## 2021-02-05 NOTE — Telephone Encounter (Signed)
Patient needing refills on atorvastatin sent to [facility] ph# [phone]

## 2021-02-05 NOTE — Telephone Encounter (Signed)
Rx sent 

## 2021-02-06 ENCOUNTER — Other Ambulatory Visit: Payer: Self-pay

## 2021-02-06 ENCOUNTER — Ambulatory Visit (INDEPENDENT_AMBULATORY_CARE_PROVIDER_SITE_OTHER): Payer: PPO | Admitting: Nurse Practitioner

## 2021-02-06 ENCOUNTER — Encounter: Payer: Self-pay | Admitting: Nurse Practitioner

## 2021-02-06 ENCOUNTER — Other Ambulatory Visit: Payer: Self-pay | Admitting: Family Medicine

## 2021-02-06 VITALS — BP 135/84 | HR 78 | Temp 97.9°F | Resp 18 | Ht 67.0 in | Wt 138.0 lb

## 2021-02-06 DIAGNOSIS — J0100 Acute maxillary sinusitis, unspecified: Secondary | ICD-10-CM | POA: Diagnosis not present

## 2021-02-06 DIAGNOSIS — D500 Iron deficiency anemia secondary to blood loss (chronic): Secondary | ICD-10-CM | POA: Diagnosis not present

## 2021-02-06 DIAGNOSIS — N1831 Chronic kidney disease, stage 3a: Secondary | ICD-10-CM

## 2021-02-06 DIAGNOSIS — Z23 Encounter for immunization: Secondary | ICD-10-CM | POA: Diagnosis not present

## 2021-02-06 DIAGNOSIS — N1832 Chronic kidney disease, stage 3b: Secondary | ICD-10-CM | POA: Insufficient documentation

## 2021-02-06 DIAGNOSIS — I10 Essential (primary) hypertension: Secondary | ICD-10-CM | POA: Diagnosis not present

## 2021-02-06 DIAGNOSIS — E7800 Pure hypercholesterolemia, unspecified: Secondary | ICD-10-CM | POA: Diagnosis not present

## 2021-02-06 MED ORDER — AMLODIPINE BESYLATE 5 MG PO TABS: 5.0000 mg | ORAL_TABLET | Freq: Every day | ORAL | 5 refills | Status: AC

## 2021-02-06 MED ORDER — HYDROCODONE-HOMATROPINE 5-1.5 MG/5ML PO SYRP: 5.0000 mL | ORAL_SOLUTION | Freq: Three times a day (TID) | ORAL | 0 refills | Status: DC | PRN

## 2021-02-06 MED ORDER — AMOXICILLIN-POT CLAVULANATE 875-125 MG PO TABS: 1.0000 | ORAL_TABLET | Freq: Two times a day (BID) | ORAL | 0 refills | Status: DC

## 2021-02-06 NOTE — Assessment & Plan Note (Signed)
-  takes atorvastatin -will check lipid panel

## 2021-02-06 NOTE — Assessment & Plan Note (Signed)
BP well controlled today.

## 2021-02-06 NOTE — Progress Notes (Signed)
Established Patient Office Visit  Subjective:  Patient ID: Jared Schmidt, male    DOB: 07-19-1936  Age: 85 y.o. MRN: [id number]  CC:  Chief Complaint  Patient presents with  . Annual Exam    HPI Jared Schmidt presents for physical exam.  He states that he has congestion that is worse at night.  He has had symptoms for 12 days, has had productive cough. He has been taking robitussin, and it is keeping him awake at night.  He has upcoming COVID test.   Past Medical History:  Diagnosis Date  . Anemia   . Arthritis   . GERD (gastroesophageal reflux disease)   . Hypercholesteremia   . Hypertension   . Iron deficiency anemia due to chronic blood loss 03/17/2018  . Iron deficiency anemia due to chronic blood loss 03/17/2018  . Stroke ([place])   . Symptomatic anemia 01/20/2018  . Vitamin B deficiency    patient denies    Past Surgical History:  Procedure Laterality Date  . BIOPSY  06/24/2018   Procedure: BIOPSY;  Surgeon: Daneil Dolin, MD;  [facility];  Service: Endoscopy;;  gastric   . COLONOSCOPY N/A 06/24/2018   Dr. Gala Romney: Internal hemorrhoids, diverticulosis, 9 mm polyp removed from the sigmoid colon which was hyperplastic.  Marland Kitchen ESOPHAGOGASTRODUODENOSCOPY N/A 06/24/2018   Dr. Gala Romney: Multiple erosions in the stomach and duodenal bulb, gastric biopsies with chronic gastritis with intestinal metaplasia, no H. pylori.  . ESOPHAGOGASTRODUODENOSCOPY (EGD) WITH PROPOFOL N/A 09/17/2018   Procedure: ESOPHAGOGASTRODUODENOSCOPY (EGD) WITH PROPOFOL;  Surgeon: Daneil Dolin, MD;  [facility];  Service: Endoscopy;  Laterality: N/A;  11:00am  . GIVENS CAPSULE STUDY N/A 09/15/2018   Procedure: GIVENS CAPSULE STUDY;  Surgeon: Daneil Dolin, MD;  [facility];  Service: Endoscopy;  Laterality: N/A;  7:30am  . none    . POLYPECTOMY  06/24/2018   Procedure: POLYPECTOMY;  Surgeon: Daneil Dolin, MD;  [facility];  Service: Endoscopy;;  sigmoid  polyp hs    Family History  Problem Relation Age of Onset  . Colon cancer Neg Hx     Social History   Socioeconomic History  . Marital status: Married    Spouse name: Lazarius Rivkin  . Number of children: Not on file  . Years of education: Not on file  . Highest education level: 8th grade  Occupational History  . Occupation: retired  Tobacco Use  . Smoking status: Former Smoker    Packs/day: 0.50    Years: 67.00    Pack years: 33.50    Types: Cigarettes    Quit date: 12/11/2017    Years since quitting: 3.1  . Smokeless tobacco: Never Used  Vaping Use  . Vaping Use: Never used  Substance and Sexual Activity  . Alcohol use: No  . Drug use: No  . Sexual activity: Not Currently  Other Topics Concern  . Not on file  Social History Narrative   Lives with wife [name] in [place]   Dog named poppet      Enjoys hanging around [employer]: eats all food groups   Caffeine: coffee   Water: 4-5 cups daily      Wears seat belt    Does not use phone while driving    Oceanographer at home    Social Determinants of Health   Financial Resource Strain: Low Risk   . Difficulty of Paying Living  Expenses: Not hard at all  Food Insecurity: No Food Insecurity  . Worried About Charity fundraiser in the Last Year: Never true  . Ran Out of Food in the Last Year: Never true  Transportation Needs: No Transportation Needs  . Lack of Transportation (Medical): No  . Lack of Transportation (Non-Medical): No  Physical Activity: Inactive  . Days of Exercise per Week: 0 days  . Minutes of Exercise per Session: 0 min  Stress: No Stress Concern Present  . Feeling of Stress : Only a little  Social Connections: Socially Isolated  . Frequency of Communication with Friends and Family: Once a week  . Frequency of Social Gatherings with Friends and Family: Never  . Attends Religious Services: Never  . Active Member of Clubs or Organizations: No  . Attends Archivist Meetings:  Never  . Marital Status: Married  Human resources officer Violence: Not At Risk  . Fear of Current or Ex-Partner: No  . Emotionally Abused: No  . Physically Abused: No  . Sexually Abused: No    Outpatient Medications Prior to Visit  Medication Sig Dispense Refill  . amLODipine (NORVASC) 5 MG tablet TAKE 1 TABLET BY MOUTH EVERY DAY 30 tablet 5  . amLODipine (NORVASC) 5 MG tablet Take 1 tablet (5 mg total) by mouth daily. 30 tablet 5  . atorvastatin (LIPITOR) 10 MG tablet Take 1 tablet (10 mg total) by mouth daily with supper. 90 tablet 3  . carvedilol (COREG) 12.5 MG tablet Take 1 tablet (12.5 mg total) by mouth 2 (two) times daily with a meal. 30 tablet 3  . cholecalciferol (VITAMIN D3) 25 MCG (1000 UNIT) tablet Take 1,000 Units by mouth daily.    . pantoprazole (PROTONIX) 40 MG tablet TAKE 1 TABLET BY MOUTH EVERY DAY 90 tablet 0  . silodosin (RAPAFLO) 8 MG CAPS capsule Take 1 capsule (8 mg total) by mouth daily with breakfast. 90 capsule 3  . vitamin C (ASCORBIC ACID) 500 MG tablet Take 500 mg by mouth daily.    . vitamin E 100 UNIT capsule Take 100 Units by mouth daily.      No facility-administered medications prior to visit.    No Known Allergies  ROS Review of Systems  Constitutional: Negative.   HENT: Positive for congestion and postnasal drip. Negative for sore throat.   Eyes: Negative.   Respiratory: Positive for cough.   Cardiovascular: Negative.   Gastrointestinal: Negative.   Endocrine: Negative.   Genitourinary: Negative.   Musculoskeletal: Negative.   Skin: Negative.   Allergic/Immunologic: Negative.   Neurological: Negative.   Hematological: Negative.   Psychiatric/Behavioral: Negative.       Objective:    Physical Exam Constitutional:      Appearance: Normal appearance.  HENT:     Head: Normocephalic and atraumatic.     Right Ear: Tympanic membrane, ear canal and external ear normal.     Left Ear: Tympanic membrane, ear canal and external ear normal.      Nose: Nose normal.     Mouth/Throat:     Mouth: Mucous membranes are moist.     Pharynx: Oropharynx is clear.  Eyes:     Extraocular Movements: Extraocular movements intact.     Conjunctiva/sclera: Conjunctivae normal.     Pupils: Pupils are equal, round, and reactive to light.  Cardiovascular:     Rate and Rhythm: Normal rate and regular rhythm.     Pulses: Normal pulses.     Heart sounds: Normal heart  sounds.  Pulmonary:     Effort: Pulmonary effort is normal.     Breath sounds: Normal breath sounds.  Abdominal:     General: Abdomen is flat. Bowel sounds are normal.     Palpations: Abdomen is soft.  Musculoskeletal:        General: Normal range of motion.     Cervical back: Normal range of motion and neck supple.     Comments: Strength 4/5 globally  Skin:    General: Skin is warm and dry.     Capillary Refill: Capillary refill takes less than 2 seconds.  Neurological:     General: No focal deficit present.     Mental Status: He is alert and oriented to person, place, and time.     Cranial Nerves: No cranial nerve deficit.     Sensory: No sensory deficit.     Motor: No weakness.     Coordination: Coordination normal.  Psychiatric:        Mood and Affect: Mood normal.        Behavior: Behavior normal.        Thought Content: Thought content normal.        Judgment: Judgment normal.     BP 135/84   Pulse 78   Temp 97.9 F (36.6 C)   Resp 18   Ht '5\' 7"'  (1.702 m)   Wt 138 lb (62.6 kg)   SpO2 93%   BMI 21.61 kg/m  Wt Readings from Last 3 Encounters:  02/06/21 138 lb (62.6 kg)  01/29/21 138 lb 7.2 oz (62.8 kg)  01/26/21 141 lb 8.6 oz (64.2 kg)     Health Maintenance Due  Topic Date Due  . PNA vac Low Risk Adult (2 of 2 - PCV13) 07/03/2008    There are no preventive care reminders to display for this patient.  No results found for: TSH Lab Results  Component Value Date   WBC 5.8 01/17/2021   HGB 9.6 (L) 01/17/2021   HCT 33.6 (L) 01/17/2021   MCV 85.3  01/17/2021   PLT 352 01/17/2021   Lab Results  Component Value Date   NA 141 01/17/2021   K 4.4 01/17/2021   CO2 23 01/17/2021   GLUCOSE 93 01/17/2021   BUN 20 01/17/2021   CREATININE 1.42 (H) 01/17/2021   BILITOT 0.3 01/17/2021   ALKPHOS 84 01/17/2021   AST 21 01/17/2021   ALT 13 01/17/2021   PROT 6.9 01/17/2021   ALBUMIN 3.6 01/17/2021   CALCIUM 9.1 01/17/2021   ANIONGAP 9 01/17/2021   Lab Results  Component Value Date   CHOL 132 02/16/2020   Lab Results  Component Value Date   HDL 39 (L) 02/16/2020   Lab Results  Component Value Date   LDLCALC 73 02/16/2020   Lab Results  Component Value Date   TRIG 99 02/16/2020   Lab Results  Component Value Date   CHOLHDL 3.4 02/16/2020   No results found for: HGBA1C    Assessment & Plan:   Problem List Items Addressed This Visit      Cardiovascular and Mediastinum   Hypertension    -BP well controlled today        Genitourinary   Chronic kidney disease (CKD) stage G3a/A1, moderately decreased glomerular filtration rate (GFR) between 45-59 mL/min/1.73 square meter and albuminuria creatinine ratio less than 30 mg/g [facility])    Lab Results  Component Value Date   NA 141 01/17/2021   K 4.4 01/17/2021   CREATININE  1.42 (H) 01/17/2021   GFRNONAA 49 (L) 01/17/2021   GFRAA 51 (L) 05/18/2020   GLUCOSE 93 01/17/2021   -will check labs today      Relevant Orders   CBC with Differential/Platelet   CMP14+EGFR     Other   Hypercholesteremia    -takes atorvastatin -will check lipid panel      Relevant Orders   CMP14+EGFR   Lipid Panel With LDL/HDL Ratio   Iron deficiency anemia due to chronic blood loss    -will check iron panel      Relevant Orders   CBC with Differential/Platelet   Iron, TIBC and Ferritin Panel   Immunization due    -Prevnar-13 administered today; already had Pneumovax-23       Other Visit Diagnoses    Acute non-recurrent maxillary sinusitis    -  Primary   Relevant Medications    amoxicillin-clavulanate (AUGMENTIN) 875-125 MG tablet   HYDROcodone-homatropine (HYCODAN) 5-1.5 MG/5ML syrup      Meds ordered this encounter  Medications  . amoxicillin-clavulanate (AUGMENTIN) 875-125 MG tablet    Sig: Take 1 tablet by mouth 2 (two) times daily.    Dispense:  14 tablet    Refill:  0  . HYDROcodone-homatropine (HYCODAN) 5-1.5 MG/5ML syrup    Sig: Take 5 mLs by mouth every 8 (eight) hours as needed for cough.    Dispense:  120 mL    Refill:  0    Follow-up: Return in about 6 months (around 08/08/2021) for Lab follow-up (CKD, anemia).    Noreene Larsson, NP

## 2021-02-06 NOTE — Addendum Note (Signed)
Addended by: Lonn Georgia on: 02/06/2021 03:06 PM   Modules accepted: Orders

## 2021-02-06 NOTE — Assessment & Plan Note (Signed)
-  Prevnar-13 administered today; already had Pneumovax-23

## 2021-02-06 NOTE — Assessment & Plan Note (Signed)
-  will check iron panel

## 2021-02-06 NOTE — Assessment & Plan Note (Signed)
Lab Results  Component Value Date   NA 141 01/17/2021   K 4.4 01/17/2021   CREATININE 1.42 (H) 01/17/2021   GFRNONAA 49 (L) 01/17/2021   GFRAA 51 (L) 05/18/2020   GLUCOSE 93 01/17/2021   -will check labs today

## 2021-02-07 LAB — LIPID PANEL WITH LDL/HDL RATIO
Cholesterol, Total: 118 mg/dL (ref 100–199)
HDL: 38 mg/dL — ABNORMAL LOW (ref >39)
LDL Chol Calc (NIH): 58 mg/dL (ref 0–99)
LDL/HDL Ratio: 1.5 ratio (ref 0.0–3.6)
Triglycerides: 122 mg/dL (ref 0–149)
VLDL Cholesterol Cal: 22 mg/dL (ref 5–40)

## 2021-02-07 LAB — CBC WITH DIFFERENTIAL/PLATELET
Basophils Absolute: 0.1 10*3/uL (ref 0.0–0.2)
Basos: 1 %
EOS (ABSOLUTE): 0.3 10*3/uL (ref 0.0–0.4)
Eos: 3 %
Hematocrit: 36.4 % — ABNORMAL LOW (ref 37.5–51.0)
Hemoglobin: 10.5 g/dL — ABNORMAL LOW (ref 13.0–17.7)
Immature Grans (Abs): 0.1 10*3/uL (ref 0.0–0.1)
Immature Granulocytes: 1 %
Lymphocytes Absolute: 1 10*3/uL (ref 0.7–3.1)
Lymphs: 10 %
MCH: 23.9 pg — ABNORMAL LOW (ref 26.6–33.0)
MCHC: 28.8 g/dL — ABNORMAL LOW (ref 31.5–35.7)
MCV: 83 fL (ref 79–97)
Monocytes Absolute: 1 10*3/uL — ABNORMAL HIGH (ref 0.1–0.9)
Monocytes: 9 %
Neutrophils Absolute: 7.8 10*3/uL — ABNORMAL HIGH (ref 1.4–7.0)
Neutrophils: 76 %
Platelets: 377 10*3/uL (ref 150–450)
RBC: 4.39 x10E6/uL (ref 4.14–5.80)
RDW: 17.4 % — ABNORMAL HIGH (ref 11.6–15.4)
WBC: 10.2 10*3/uL (ref 3.4–10.8)

## 2021-02-07 LAB — CMP14+EGFR
ALT: 13 IU/L (ref 0–44)
AST: 26 IU/L (ref 0–40)
Albumin/Globulin Ratio: 1.4 (ref 1.2–2.2)
Albumin: 3.9 g/dL (ref 3.6–4.6)
Alkaline Phosphatase: 109 IU/L (ref 44–121)
BUN/Creatinine Ratio: 14 (ref 10–24)
BUN: 16 mg/dL (ref 8–27)
Bilirubin Total: <0.2 mg/dL (ref 0.0–1.2)
CO2: 25 mmol/L (ref 20–29)
Calcium: 9.7 mg/dL (ref 8.6–10.2)
Chloride: 106 mmol/L (ref 96–106)
Creatinine, Ser: 1.17 mg/dL (ref 0.76–1.27)
Globulin, Total: 2.7 g/dL (ref 1.5–4.5)
Glucose: 83 mg/dL (ref 65–99)
Potassium: 5.3 mmol/L — ABNORMAL HIGH (ref 3.5–5.2)
Sodium: 143 mmol/L (ref 134–144)
Total Protein: 6.6 g/dL (ref 6.0–8.5)
eGFR: 62 mL/min/{1.73_m2} (ref >59)

## 2021-02-07 LAB — IRON,TIBC AND FERRITIN PANEL
Ferritin: 780 ng/mL — ABNORMAL HIGH (ref 30–400)
Iron Saturation: 17 % (ref 15–55)
Iron: 49 ug/dL (ref 38–169)
Total Iron Binding Capacity: 282 ug/dL (ref 250–450)
UIBC: 233 ug/dL (ref 111–343)

## 2021-02-07 NOTE — Progress Notes (Signed)
Hemoglobin has improved since the last labs were drawn. He has been sick, and ferritin and neutrophils are appropriately elevated given his acute illness.

## 2021-02-09 ENCOUNTER — Other Ambulatory Visit: Payer: Self-pay

## 2021-02-09 ENCOUNTER — Other Ambulatory Visit (HOSPITAL_COMMUNITY)
Admission: RE | Admit: 2021-02-09 | Discharge: 2021-02-09 | Disposition: A | Discharge status: Home / Self Care | Payer: PPO | Source: Ambulatory Visit | Attending: Physician Assistant | Admitting: Physician Assistant

## 2021-02-09 DIAGNOSIS — Z01812 Encounter for preprocedural laboratory examination: Secondary | ICD-10-CM | POA: Insufficient documentation

## 2021-02-09 DIAGNOSIS — Z20822 Contact with and (suspected) exposure to covid-19: Secondary | ICD-10-CM | POA: Diagnosis not present

## 2021-02-10 LAB — SARS CORONAVIRUS 2 (TAT 6-24 HRS): SARS Coronavirus 2: NEGATIVE

## 2021-02-13 ENCOUNTER — Other Ambulatory Visit: Payer: Self-pay

## 2021-02-13 ENCOUNTER — Ambulatory Visit (HOSPITAL_COMMUNITY)
Admission: RE | Admit: 2021-02-13 | Discharge: 2021-02-13 | Disposition: A | Discharge status: Home / Self Care | Payer: PPO | Source: Ambulatory Visit | Attending: Physician Assistant | Admitting: Physician Assistant

## 2021-02-13 DIAGNOSIS — R918 Other nonspecific abnormal finding of lung field: Secondary | ICD-10-CM | POA: Insufficient documentation

## 2021-02-13 LAB — PULMONARY FUNCTION TEST
DL/VA % pred: 50 %
DL/VA: 1.99 ml/min/mmHg/L
DLCO cor % pred: 32 %
DLCO cor: 7.03 ml/min/mmHg
DLCO unc % pred: 27 %
DLCO unc: 6.06 ml/min/mmHg
FEF 25-75 Post: 0.49 L/sec
FEF 25-75 Pre: 0.4 L/sec
FEF2575-%Change-Post: 23 %
FEF2575-%Pred-Post: 28 %
FEF2575-%Pred-Pre: 23 %
FEV1-%Change-Post: 3 %
FEV1-%Pred-Post: 54 %
FEV1-%Pred-Pre: 53 %
FEV1-Post: 1.24 L
FEV1-Pre: 1.21 L
FEV1FVC-%Change-Post: -3 %
FEV1FVC-%Pred-Pre: 64 %
FEV6-%Change-Post: 7 %
FEV6-%Pred-Post: 79 %
FEV6-%Pred-Pre: 73 %
FEV6-Post: 2.36 L
FEV6-Pre: 2.19 L
FEV6FVC-%Change-Post: 0 %
FEV6FVC-%Pred-Post: 94 %
FEV6FVC-%Pred-Pre: 94 %
FVC-%Change-Post: 7 %
FVC-%Pred-Post: 84 %
FVC-%Pred-Pre: 78 %
FVC-Post: 2.68 L
FVC-Pre: 2.5 L
Post FEV1/FVC ratio: 46 %
Post FEV6/FVC ratio: 88 %
Pre FEV1/FVC ratio: 48 %
Pre FEV6/FVC Ratio: 88 %
RV % pred: 128 %
RV: 3.24 L
TLC % pred: 88 %
TLC: 5.71 L

## 2021-02-13 MED ORDER — ALBUTEROL SULFATE (2.5 MG/3ML) 0.083% IN NEBU
2.5000 mg | INHALATION_SOLUTION | Freq: Once | RESPIRATORY_TRACT | Status: AC
  Administered 2021-02-13: 2.5 mg via RESPIRATORY_TRACT

## 2021-02-20 ENCOUNTER — Encounter: Payer: Self-pay | Admitting: Thoracic Surgery (Cardiothoracic Vascular Surgery)

## 2021-02-20 ENCOUNTER — Other Ambulatory Visit: Payer: Self-pay

## 2021-02-20 ENCOUNTER — Institutional Professional Consult (permissible substitution): Payer: PPO | Admitting: Thoracic Surgery (Cardiothoracic Vascular Surgery)

## 2021-02-20 VITALS — BP 150/73 | HR 77 | Resp 20 | Ht 67.0 in | Wt 138.0 lb

## 2021-02-20 DIAGNOSIS — R918 Other nonspecific abnormal finding of lung field: Secondary | ICD-10-CM | POA: Diagnosis not present

## 2021-02-20 NOTE — H&P (View-Only) (Signed)
PCP is Perlie Mayo, NP Referring Provider is Derek Jack, MD  Chief Complaint  Patient presents with  . Routine Post Op  . Lung Mass    Surgical consult, PFT's 02/13/21, PET Scan 01/25/21, CTA Chest 01/13/21    HPI: [name] is sent for consultation regarding right upper lobe lung masses.  Kerrigan Glendening is an 85 year old man with a history of tobacco abuse, hypertension, hyperlipidemia, iron deficiency anemia, stroke, reflux, and arthritis.  He recently had an episode of hemoptysis.  He went to the ED at [facility].  A CT of the chest showed 2 right upper lobe lung masses.  He saw Dr. Delton Coombes.  A PET/CT showed that both masses were markedly hypermetabolic.  There was some metabolic activity in the region of the hilar and right paratracheal nodes as well although there were no definite pathologic lymph nodes in those areas.  He only had the 1 episode of hemoptysis.  He says he is felt fine otherwise.  He denies any additional coughing, wheezing, or shortness of breath.  Denies chest pain, pressure or tightness.  No shortness of breath with routine activities.  No change in appetite or weight loss.  Zubrod Score: At the time of surgery this patient's most appropriate activity status/level should be described as: []     0    Normal activity, no symptoms [x]     1    Restricted in physical strenuous activity but ambulatory, able to do out light work []     2    Ambulatory and capable of self care, unable to do work activities, up and about >50 % of waking hours                              []     3    Only limited self care, in bed greater than 50% of waking hours []     4    Completely disabled, no self care, confined to bed or chair []     5    Moribund  Past Medical History:  Diagnosis Date  . Anemia   . Arthritis   . GERD (gastroesophageal reflux disease)   . Hypercholesteremia   . Hypertension   . Iron deficiency anemia due to chronic blood loss 03/17/2018  . Iron deficiency  anemia due to chronic blood loss 03/17/2018  . Stroke ([place])   . Symptomatic anemia 01/20/2018  . Vitamin B deficiency    patient denies    Past Surgical History:  Procedure Laterality Date  . BIOPSY  06/24/2018   Procedure: BIOPSY;  Surgeon: Daneil Dolin, MD;  [facility];  Service: Endoscopy;;  gastric   . COLONOSCOPY N/A 06/24/2018   Dr. Gala Romney: Internal hemorrhoids, diverticulosis, 9 mm polyp removed from the sigmoid colon which was hyperplastic.  Marland Kitchen ESOPHAGOGASTRODUODENOSCOPY N/A 06/24/2018   Dr. Gala Romney: Multiple erosions in the stomach and duodenal bulb, gastric biopsies with chronic gastritis with intestinal metaplasia, no H. pylori.  . ESOPHAGOGASTRODUODENOSCOPY (EGD) WITH PROPOFOL N/A 09/17/2018   Procedure: ESOPHAGOGASTRODUODENOSCOPY (EGD) WITH PROPOFOL;  Surgeon: Daneil Dolin, MD;  [facility];  Service: Endoscopy;  Laterality: N/A;  11:00am  . GIVENS CAPSULE STUDY N/A 09/15/2018   Procedure: GIVENS CAPSULE STUDY;  Surgeon: Daneil Dolin, MD;  [facility];  Service: Endoscopy;  Laterality: N/A;  7:30am  . none    . POLYPECTOMY  06/24/2018   Procedure: POLYPECTOMY;  Surgeon:  Rourk, Cristopher Estimable, MD;  [facility];  Service: Endoscopy;;  sigmoid polyp hs    Family History  Problem Relation Age of Onset  . Colon cancer Neg Hx     Social History Social History   Tobacco Use  . Smoking status: Former Smoker    Packs/day: 0.50    Years: 67.00    Pack years: 33.50    Types: Cigarettes    Quit date: 12/11/2017    Years since quitting: 3.1  . Smokeless tobacco: Never Used  Vaping Use  . Vaping Use: Never used  Substance Use Topics  . Alcohol use: No  . Drug use: No    Current Outpatient Medications  Medication Sig Dispense Refill  . amLODipine (NORVASC) 5 MG tablet Take 1 tablet (5 mg total) by mouth daily. 30 tablet 5  . amoxicillin-clavulanate (AUGMENTIN) 875-125 MG tablet Take 1 tablet by mouth 2 (two) times daily. 14  tablet 0  . atorvastatin (LIPITOR) 10 MG tablet Take 1 tablet (10 mg total) by mouth daily with supper. 90 tablet 3  . carvedilol (COREG) 12.5 MG tablet Take 1 tablet (12.5 mg total) by mouth 2 (two) times daily with a meal. 30 tablet 3  . cholecalciferol (VITAMIN D3) 25 MCG (1000 UNIT) tablet Take 1,000 Units by mouth daily.    Marland Kitchen HYDROcodone-homatropine (HYCODAN) 5-1.5 MG/5ML syrup Take 5 mLs by mouth every 8 (eight) hours as needed for cough. 120 mL 0  . pantoprazole (PROTONIX) 40 MG tablet TAKE 1 TABLET BY MOUTH EVERY DAY 90 tablet 0  . silodosin (RAPAFLO) 8 MG CAPS capsule Take 1 capsule (8 mg total) by mouth daily with breakfast. 90 capsule 3  . vitamin C (ASCORBIC ACID) 500 MG tablet Take 500 mg by mouth daily.    . vitamin E 100 UNIT capsule Take 100 Units by mouth daily.      No current facility-administered medications for this visit.    No Known Allergies  Review of Systems  Constitutional: Negative for activity change, appetite change and unexpected weight change.  HENT: Negative for trouble swallowing and voice change.   Respiratory: Positive for cough (Hemoptysis x1). Negative for shortness of breath and wheezing.   Cardiovascular: Negative for chest pain and leg swelling.  Gastrointestinal: Negative for abdominal distention and abdominal pain.  Hematological: Does not bruise/bleed easily.  All other systems reviewed and are negative.   BP (!) 150/73   Pulse 77   Resp 20   Ht 5\' 7"  (1.702 m)   Wt 138 lb (62.6 kg)   SpO2 93% Comment: RA  BMI 21.61 kg/m  Physical Exam Vitals reviewed.  Constitutional:      General: He is not in acute distress.    Appearance: Normal appearance.  HENT:     Head: Normocephalic and atraumatic.  Eyes:     General: No scleral icterus.    Extraocular Movements: Extraocular movements intact.  Cardiovascular:     Rate and Rhythm: Normal rate and regular rhythm.     Heart sounds: Normal heart sounds. No murmur heard. No friction rub. No  gallop.   Pulmonary:     Effort: Pulmonary effort is normal. No respiratory distress.     Breath sounds: Normal breath sounds. No wheezing or rales.  Abdominal:     General: There is no distension.     Palpations: Abdomen is soft.  Musculoskeletal:     Cervical back: Neck supple.  Lymphadenopathy:     Cervical: No cervical adenopathy.  Skin:    General: Skin is warm and dry.  Neurological:     General: No focal deficit present.     Mental Status: He is alert and oriented to person, place, and time.     Cranial Nerves: No cranial nerve deficit.     Motor: No weakness.    Diagnostic Tests: NUCLEAR MEDICINE PET SKULL BASE TO THIGH  TECHNIQUE: 7.1 mCi F-18 FDG was injected intravenously. Full-ring PET imaging was performed from the skull base to thigh after the radiotracer. CT data was obtained and used for attenuation correction and anatomic localization.  Fasting blood glucose: 90 mg/dl  COMPARISON:  Chest CT 01/13/2021  FINDINGS: Mediastinal blood pool activity: SUV max 2.3  Liver activity: SUV max NA  NECK: Hypo metabolic activity posteriorly in the left cerebellum correlating with prior infarct shown on MRI brain 01/18/2021.  Incidental CT findings: Bilateral common carotid atherosclerotic calcification.  CHEST: The anterior mass in the right upper lobe measures 3.3 by 2.4 cm on image 70 of series 4, with maximum SUV of 22.5 compatible with malignancy.  The more posterior mass measures 3.0 by 1.8 cm on image 64 of series 4 and has a maximum SUV 10.5, compatible with malignancy.  Small focus of accentuated activity at the right hilum probably represents a hilar lymph node, maximum SUV 4.6, suspicious.  A small right lower paratracheal lymph node measuring 0.5 cm in short axis on image 68 of series 4 has maximum SUV of 3.3, indeterminate but above blood pool levels.  Incidental CT findings: Centrilobular emphysema. Coronary, aortic arch, and  branch vessel atherosclerotic vascular disease.  ABDOMEN/PELVIS: Fullness left adrenal gland with density of 3 Hounsfield units compatible with adenoma, maximum SUV of the left adrenal gland is 4.6.  Accentuated activity diffusely in the prostate gland, maximum SUV approximately 4.9.  Incidental CT findings: Possible right scrotal hydrocele with some calcifications in the right scrotum which are nonspecific but could be further worked up with scrotal ultrasound.  Aortoiliac atherosclerotic vascular disease. Infrarenal abdominal aortic aneurysm 3.6 cm in maximum dimension on image 129 of series 4, with some mild focal right posterior irregularity of the abdominal aorta. Atrophic left kidney with hydronephrosis and hydroureter extending down to the level of the iliac vessel cross over.  SKELETON: No significant abnormal hypermetabolic activity in this region.  Incidental CT findings: Notable subcortical cystic lesions along the acetabula bilaterally. No associated accentuated metabolic activity. Lower lumbar spondylosis.  IMPRESSION: 1. Both right upper lobe lung masses are hypermetabolic. Mildly hypermetabolic but small right hilar lymph node, probably involved. Faintly accentuated metabolic activity of a right paratracheal lymph node. 2. No findings of extra thoracic metastatic disease. 3. Prostatomegaly with accentuated activity in the prostate gland diffusely. This may well be inflammatory, but correlation with PSA level is suggested. 4. Suspected right scrotal hydrocele with calcifications in the right hemiscrotum of uncertain significance. Correlate with physical exam findings in determining whether further workup with scrotal ultrasound is warranted. 5. Infrarenal abdominal aortic aneurysm 3.6 cm in diameter with some mild focal right posterior bulging of the abdominal aorta as noted above. Recommend follow-up ultrasound every 2 years. This recommendation  follows ACR consensus guidelines: White Paper of the ACR Incidental Findings Committee II on Vascular Findings. J Am Coll Radiol 2013; 10:789-794. 6. Other imaging findings of potential clinical significance: Remote left posterior cerebellar infarct. Aortic Atherosclerosis (ICD10-I70.0) and Emphysema (ICD10-J43.9). Coronary atherosclerosis. Left adrenal adenoma. Atrophic left kidney with hydronephrosis proximal hydroureter. Prominent degenerative subcortical cysts along the  acetabula bilaterally. Lower lumbar spondylosis.   Electronically Signed   By: Van Clines M.D.   On: 01/25/2021 16:33 I personally reviewed the PET/CT images.  I concur with the findings of 2 right upper lobe masses that are both hypermetabolic, vague metabolic activity along the right hilar and paratracheal regions, severe diffuse atherosclerotic cardiovascular disease including thoracic and abdominal aorta, and abdominal aortic aneurysm.  Pulmonary function testing FVC 2.50 (78%) FEV1 1.21 (53%) FEV1 1.24 (54%) postbronchodilator DLCO 7.03 (32%)  Impression: Jared Schmidt is an 85 year old man with a history of tobacco abuse, hypertension, hyperlipidemia, iron deficiency anemia, stroke, reflux, and arthritis.  Through his work-up has been found to have thoracic aortic atherosclerosis and COPD.  He presented with hemoptysis.  Work-up showed 2 masses in the right upper lobe.  On PET CT these areas were both hypermetabolic.  Although infectious and inflammatory nodules are in the differential this almost certainly is new primary bronchogenic carcinoma.. He has at least T3, N0 stage IIb disease.  It could be as advanced as T3N2 stage IIIb.  Unfortunately he has significant COPD.  He is borderline in terms of FEV1 but could possibly tolerate a lobectomy from that standpoint.  However his diffusion capacity is severely reduced at 32%.  That is prohibitive for lobectomy in my opinion.  I recommended to him that we  do a navigational bronchoscopy and endobronchial ultrasound for diagnosis and staging purposes.  I described the proposed procedure to he and his wife.  They understand it would be done under general anesthesia.  We would plan to do it as an outpatient.  There are no incisions.  I informed them of the indications, risk, benefits, and alternatives.  They understand the risk include, but not limited to death, MI, DVT, PE, stroke, bleeding, pneumothorax, and failure to make a diagnosis.  He does not want to have a biopsy at this time.  He is not interested in having surgery.  He also says he has no interest in having chemo or radiation.  I talked to him about the possibility that he might be a candidate for immunotherapy or targeted therapy depending on pathology findings.  He says that he has no interest in that either.  At this point he is refusing any further work-up.  He did say that he would talk to his wife further about that.  He said he has a follow-up appointment scheduled with Dr. Delton Coombes, but I cannot find anything in epic to indicate that is the case.  Plan: Follow-up with Dr. Lamonte Richer Patient will let us know if you would like to proceed with navigational bronchoscopy and endobronchial ultrasound  Melrose Nakayama, MD [facility] [phone]

## 2021-02-20 NOTE — Progress Notes (Signed)
PCP is Perlie Mayo, NP Referring Provider is Derek Jack, MD  Chief Complaint  Patient presents with  . Routine Post Op  . Lung Mass    Surgical consult, PFT's 02/13/21, PET Scan 01/25/21, CTA Chest 01/13/21    HPI: Jared Schmidt is sent for consultation regarding right upper lobe lung masses.  Jared Schmidt is Jared 85 year old Schmidt with a history of tobacco abuse, hypertension, hyperlipidemia, iron deficiency anemia, stroke, reflux, and arthritis.  Jared Schmidt recently had Jared episode of hemoptysis.  Jared Schmidt went to the ED at [facility].  A CT of the chest showed 2 right upper lobe lung masses.  Jared Schmidt saw Dr. Delton Coombes.  A PET/CT showed that both masses were markedly hypermetabolic.  There was some metabolic activity in the region of the hilar and right paratracheal nodes as well although there were no definite pathologic lymph nodes in those areas.  Jared Schmidt only had the 1 episode of hemoptysis.  Jared Schmidt says Jared Schmidt is felt fine otherwise.  Jared Schmidt denies any additional coughing, wheezing, or shortness of breath.  Denies chest pain, pressure or tightness.  No shortness of breath with routine activities.  No change in appetite or weight loss.  Zubrod Score: At the time of surgery this patient's most appropriate activity status/level should be described as: []     0    Normal activity, no symptoms [x]     1    Restricted in physical strenuous activity but ambulatory, able to do out light work []     2    Ambulatory and capable of self care, unable to do work activities, up and about >50 % of waking hours                              []     3    Only limited self care, in bed greater than 50% of waking hours []     4    Completely disabled, no self care, confined to bed or chair []     5    Moribund  Past Medical History:  Diagnosis Date  . Anemia   . Arthritis   . GERD (gastroesophageal reflux disease)   . Hypercholesteremia   . Hypertension   . Iron deficiency anemia due to chronic blood loss 03/17/2018  . Iron deficiency  anemia due to chronic blood loss 03/17/2018  . Stroke ([place])   . Symptomatic anemia 01/20/2018  . Vitamin B deficiency    patient denies    Past Surgical History:  Procedure Laterality Date  . BIOPSY  06/24/2018   Procedure: BIOPSY;  Surgeon: Daneil Dolin, MD;  [facility];  Service: Endoscopy;;  gastric   . COLONOSCOPY N/A 06/24/2018   Dr. Gala Romney: Internal hemorrhoids, diverticulosis, 9 mm polyp removed from the sigmoid colon which was hyperplastic.  Marland Kitchen ESOPHAGOGASTRODUODENOSCOPY N/A 06/24/2018   Dr. Gala Romney: Multiple erosions in the stomach and duodenal bulb, gastric biopsies with chronic gastritis with intestinal metaplasia, no H. pylori.  . ESOPHAGOGASTRODUODENOSCOPY (EGD) WITH PROPOFOL N/A 09/17/2018   Procedure: ESOPHAGOGASTRODUODENOSCOPY (EGD) WITH PROPOFOL;  Surgeon: Daneil Dolin, MD;  Location[facility];  Service: Endoscopy;  Laterality: N/A;  11:00am  . GIVENS CAPSULE STUDY N/A 09/15/2018   Procedure: GIVENS CAPSULE STUDY;  Surgeon: Daneil Dolin, MD;  Location: [facility];  Service: Endoscopy;  Laterality: N/A;  7:30am  . none    . POLYPECTOMY  06/24/2018   Procedure: POLYPECTOMY;  Surgeon:  Rourk, Cristopher Estimable, MD;  Location: [facility];  Service: Endoscopy;;  sigmoid polyp hs    Family History  Problem Relation Age of Onset  . Colon cancer Neg Hx     Social History Social History   Tobacco Use  . Smoking status: Former Smoker    Packs/day: 0.50    Years: 67.00    Pack years: 33.50    Types: Cigarettes    Quit date: 12/11/2017    Years since quitting: 3.1  . Smokeless tobacco: Never Used  Vaping Use  . Vaping Use: Never used  Substance Use Topics  . Alcohol use: No  . Drug use: No    Current Outpatient Medications  Medication Sig Dispense Refill  . amLODipine (NORVASC) 5 MG tablet Take 1 tablet (5 mg total) by mouth daily. 30 tablet 5  . amoxicillin-clavulanate (AUGMENTIN) 875-125 MG tablet Take 1 tablet by mouth 2 (two) times daily. 14  tablet 0  . atorvastatin (LIPITOR) 10 MG tablet Take 1 tablet (10 mg total) by mouth daily with supper. 90 tablet 3  . carvedilol (COREG) 12.5 MG tablet Take 1 tablet (12.5 mg total) by mouth 2 (two) times daily with a meal. 30 tablet 3  . cholecalciferol (VITAMIN D3) 25 MCG (1000 UNIT) tablet Take 1,000 Units by mouth daily.    Marland Kitchen HYDROcodone-homatropine (HYCODAN) 5-1.5 MG/5ML syrup Take 5 mLs by mouth every 8 (eight) hours as needed for cough. 120 mL 0  . pantoprazole (PROTONIX) 40 MG tablet TAKE 1 TABLET BY MOUTH EVERY DAY 90 tablet 0  . silodosin (RAPAFLO) 8 MG CAPS capsule Take 1 capsule (8 mg total) by mouth daily with breakfast. 90 capsule 3  . vitamin C (ASCORBIC ACID) 500 MG tablet Take 500 mg by mouth daily.    . vitamin E 100 UNIT capsule Take 100 Units by mouth daily.      No current facility-administered medications for this visit.    No Known Allergies  Review of Systems  Constitutional: Negative for activity change, appetite change and unexpected weight change.  HENT: Negative for trouble swallowing and voice change.   Respiratory: Positive for cough (Hemoptysis x1). Negative for shortness of breath and wheezing.   Cardiovascular: Negative for chest pain and leg swelling.  Gastrointestinal: Negative for abdominal distention and abdominal pain.  Hematological: Does not bruise/bleed easily.  All other systems reviewed and are negative.   BP (!) 150/73   Pulse 77   Resp 20   Ht 5\' 7"  (1.702 m)   Wt 138 lb (62.6 kg)   SpO2 93% Comment: RA  BMI 21.61 kg/m  Physical Exam Vitals reviewed.  Constitutional:      General: Jared Schmidt is not in acute distress.    Appearance: Normal appearance.  HENT:     Head: Normocephalic and atraumatic.  Eyes:     General: No scleral icterus.    Extraocular Movements: Extraocular movements intact.  Cardiovascular:     Rate and Rhythm: Normal rate and regular rhythm.     Heart sounds: Normal heart sounds. No murmur heard. No friction rub. No  gallop.   Pulmonary:     Effort: Pulmonary effort is normal. No respiratory distress.     Breath sounds: Normal breath sounds. No wheezing or rales.  Abdominal:     General: There is no distension.     Palpations: Abdomen is soft.  Musculoskeletal:     Cervical back: Neck supple.  Lymphadenopathy:     Cervical: No cervical adenopathy.  Skin:    General: Skin is warm and dry.  Neurological:     General: No focal deficit present.     Mental Status: Jared Schmidt is alert and oriented to person, place, and time.     Cranial Nerves: No cranial nerve deficit.     Motor: No weakness.    Diagnostic Tests: NUCLEAR MEDICINE PET SKULL BASE TO THIGH  TECHNIQUE: 7.1 mCi F-18 FDG was injected intravenously. Full-ring PET imaging was performed from the skull base to thigh after the radiotracer. CT data was obtained and used for attenuation correction and anatomic localization.  Fasting blood glucose: 90 mg/dl  COMPARISON:  Chest CT 01/13/2021  FINDINGS: Mediastinal blood pool activity: SUV max 2.3  Liver activity: SUV max NA  NECK: Hypo metabolic activity posteriorly in the left cerebellum correlating with prior infarct shown on MRI brain 01/18/2021.  Incidental CT findings: Bilateral common carotid atherosclerotic calcification.  CHEST: The anterior mass in the right upper lobe measures 3.3 by 2.4 cm on image 70 of series 4, with maximum SUV of 22.5 compatible with malignancy.  The more posterior mass measures 3.0 by 1.8 cm on image 64 of series 4 and has a maximum SUV 10.5, compatible with malignancy.  Small focus of accentuated activity at the right hilum probably represents a hilar lymph node, maximum SUV 4.6, suspicious.  A small right lower paratracheal lymph node measuring 0.5 cm in short axis on image 68 of series 4 has maximum SUV of 3.3, indeterminate but above blood pool levels.  Incidental CT findings: Centrilobular emphysema. Coronary, aortic arch, and  branch vessel atherosclerotic vascular disease.  ABDOMEN/PELVIS: Fullness left adrenal gland with density of 3 Hounsfield units compatible with adenoma, maximum SUV of the left adrenal gland is 4.6.  Accentuated activity diffusely in the prostate gland, maximum SUV approximately 4.9.  Incidental CT findings: Possible right scrotal hydrocele with some calcifications in the right scrotum which are nonspecific but could be further worked up with scrotal ultrasound.  Aortoiliac atherosclerotic vascular disease. Infrarenal abdominal aortic aneurysm 3.6 cm in maximum dimension on image 129 of series 4, with some mild focal right posterior irregularity of the abdominal aorta. Atrophic left kidney with hydronephrosis and hydroureter extending down to the level of the iliac vessel cross over.  SKELETON: No significant abnormal hypermetabolic activity in this region.  Incidental CT findings: Notable subcortical cystic lesions along the acetabula bilaterally. No associated accentuated metabolic activity. Lower lumbar spondylosis.  IMPRESSION: 1. Both right upper lobe lung masses are hypermetabolic. Mildly hypermetabolic but small right hilar lymph node, probably involved. Faintly accentuated metabolic activity of a right paratracheal lymph node. 2. No findings of extra thoracic metastatic disease. 3. Prostatomegaly with accentuated activity in the prostate gland diffusely. This may well be inflammatory, but correlation with PSA level is suggested. 4. Suspected right scrotal hydrocele with calcifications in the right hemiscrotum of uncertain significance. Correlate with physical exam findings in determining whether further workup with scrotal ultrasound is warranted. 5. Infrarenal abdominal aortic aneurysm 3.6 cm in diameter with some mild focal right posterior bulging of the abdominal aorta as noted above. Recommend follow-up ultrasound every 2 years. This recommendation  follows ACR consensus guidelines: White Paper of the ACR Incidental Findings Committee II on Vascular Findings. J Am Coll Radiol 2013; 10:789-794. 6. Other imaging findings of potential clinical significance: Remote left posterior cerebellar infarct. Aortic Atherosclerosis (ICD10-I70.0) and Emphysema (ICD10-J43.9). Coronary atherosclerosis. Left adrenal adenoma. Atrophic left kidney with hydronephrosis proximal hydroureter. Prominent degenerative subcortical cysts along the  acetabula bilaterally. Lower lumbar spondylosis.   Electronically Signed   By: Van Clines M.D.   On: 01/25/2021 16:33 I personally reviewed the PET/CT images.  I concur with the findings of 2 right upper lobe masses that are both hypermetabolic, vague metabolic activity along the right hilar and paratracheal regions, severe diffuse atherosclerotic cardiovascular disease including thoracic and abdominal aorta, and abdominal aortic aneurysm.  Pulmonary function testing FVC 2.50 (78%) FEV1 1.21 (53%) FEV1 1.24 (54%) postbronchodilator DLCO 7.03 (32%)  Impression: Jared Schmidt is Jared Schmidt with a history of tobacco abuse, hypertension, hyperlipidemia, iron deficiency anemia, stroke, reflux, and arthritis.  Through Jared Schmidt work-up has been found to have thoracic aortic atherosclerosis and COPD.  Jared Schmidt presented with hemoptysis.  Work-up showed 2 masses in the right upper lobe.  On PET CT these areas were both hypermetabolic.  Although infectious and inflammatory nodules are in the differential this almost certainly is new primary bronchogenic carcinoma.. Jared Schmidt has at least T3, N0 stage IIb disease.  It could be as advanced as T3N2 stage IIIb.  Unfortunately Jared Schmidt has significant COPD.  Jared Schmidt is borderline in terms of FEV1 but could possibly tolerate a lobectomy from that standpoint.  However Jared Schmidt diffusion capacity is severely reduced at 32%.  That is prohibitive for lobectomy in my opinion.  I recommended to him that we  do a navigational bronchoscopy and endobronchial ultrasound for diagnosis and staging purposes.  I described the proposed procedure to Jared Schmidt and Jared Schmidt wife.  They understand it would be done under general anesthesia.  We would plan to do it as Jared outpatient.  There are no incisions.  I informed them of the indications, risk, benefits, and alternatives.  They understand the risk include, but not limited to death, MI, DVT, PE, stroke, bleeding, pneumothorax, and failure to make a diagnosis.  Jared Schmidt does not want to have a biopsy at this time.  Jared Schmidt is not interested in having surgery.  Jared Schmidt also says Jared Schmidt has no interest in having chemo or radiation.  I talked to him about the possibility that Jared Schmidt might be a candidate for immunotherapy or targeted therapy depending on pathology findings.  Jared Schmidt says that Jared Schmidt has no interest in that either.  At this point Jared Schmidt is refusing any further work-up.  Jared Schmidt did say that Jared Schmidt would talk to Jared Schmidt wife further about that.  Jared Schmidt said Jared Schmidt has a follow-up appointment scheduled with Dr. Delton Coombes, but I cannot find anything in epic to indicate that is the case.  Plan: Follow-up with Dr. Lamonte Richer Patient will let us know if you would like to proceed with navigational bronchoscopy and endobronchial ultrasound  Melrose Nakayama, MD [facility] [phone]

## 2021-02-21 ENCOUNTER — Encounter: Payer: Self-pay | Admitting: *Deleted

## 2021-02-21 ENCOUNTER — Other Ambulatory Visit: Payer: Self-pay | Admitting: *Deleted

## 2021-02-21 DIAGNOSIS — R911 Solitary pulmonary nodule: Secondary | ICD-10-CM

## 2021-03-06 NOTE — Progress Notes (Signed)
Surgical Instructions    Your procedure is scheduled on Friday 03/09/2021.  Report to [facility] Main Entrance "A" at 09:45 A.M., then check in with the Admitting office.  Call this number if you have problems the morning of surgery:  [phone]   If you have any questions prior to your surgery date call [phone]: Open Monday-Friday 8am-4pm    Remember:  Do not eat or drink after midnight the night before your surgery     Take these medicines the morning of surgery with A SIP OF WATER: Acetaminophen (Tylenol) - if needed Amlodipine (Norvasc) Carvedilol (Coreg) Pantoprazole (Protonix)  As of today, STOP taking any Aleve, Naproxen, Ibuprofen, Motrin, Advil, Goody's, BC's, all herbal medications, fish oil, and all vitamins.  Follow your surgeon's instructions on when to stop Aspirin.  If no instructions were given by your surgeon then you will need to call the office to get those instructions.                        Do not wear jewelry            Do not wear lotions, powders, colognes, or deodorant.            Men may shave face and neck.            Do not bring valuables to the hospital.            [facility] is not responsible for any belongings or valuables.  Do NOT Smoke (Tobacco/Vaping) or drink Alcohol 24 hours prior to your procedure  If you use a CPAP at night, you may bring all equipment for your overnight stay.   Contacts, glasses, hearing aids, dentures or partials may not be worn into surgery, please bring cases for these belongings   For patients admitted to the hospital, discharge time will be determined by your treatment team.   Patients discharged the day of surgery will not be allowed to drive home, and someone needs to stay with them for 24 hours.    Special instructions:   [facility]- Preparing For Surgery  Before surgery, you can play an important role. Because skin is not sterile, your skin needs to be as free of germs as possible. You can reduce  the number of germs on your skin by washing with CHG (chlorahexidine gluconate) Soap before surgery.  CHG is an antiseptic cleaner which kills germs and bonds with the skin to continue killing germs even after washing.    Oral Hygiene is also important to reduce your risk of infection.  Remember - BRUSH YOUR TEETH THE MORNING OF SURGERY WITH YOUR REGULAR TOOTHPASTE  Please do not use if you have an allergy to CHG or antibacterial soaps. If your skin becomes reddened/irritated stop using the CHG.  Do not shave (including legs and underarms) for at least 48 hours prior to first CHG shower. It is OK to shave your face.  Please follow these instructions carefully.   1. Shower the NIGHT BEFORE SURGERY and the MORNING OF SURGERY  2. If you chose to wash your hair, wash your hair first as usual with your normal shampoo.  3. After you shampoo, rinse your hair and body thoroughly to remove the shampoo.  4. Wash Face and genitals (private parts) with your normal soap.   5.  Shower the NIGHT BEFORE SURGERY and the MORNING OF SURGERY with CHG Soap.   6. Use CHG Soap as you would  any other liquid soap. You can apply CHG directly to the skin and wash gently with a scrungie or a clean washcloth.   7. Apply the CHG Soap to your body ONLY FROM THE NECK DOWN.  Do not use on open wounds or open sores. Avoid contact with your eyes, ears, mouth and genitals (private parts). Wash Face and genitals (private parts)  with your normal soap.   8. Wash thoroughly, paying special attention to the area where your surgery will be performed.  9. Thoroughly rinse your body with warm water from the neck down.  10. DO NOT shower/wash with your normal soap after using and rinsing off the CHG Soap.  11. Pat yourself dry with a CLEAN TOWEL.  12. Wear CLEAN PAJAMAS to bed the night before surgery  13. Place CLEAN SHEETS on your bed the night before your surgery  14. DO NOT SLEEP WITH PETS.   Day of Surgery: Take a  shower with CHG soap. Wear Clean/Comfortable clothing the morning of surgery Do not apply any deodorants/lotions.   Remember to brush your teeth WITH YOUR REGULAR TOOTHPASTE.   Please read over the following fact sheets that you were given.

## 2021-03-07 ENCOUNTER — Other Ambulatory Visit (HOSPITAL_COMMUNITY)
Admission: RE | Admit: 2021-03-07 | Discharge: 2021-03-07 | Disposition: A | Discharge status: Home / Self Care | Payer: PPO | Source: Ambulatory Visit | Attending: Thoracic Surgery (Cardiothoracic Vascular Surgery) | Admitting: Thoracic Surgery (Cardiothoracic Vascular Surgery)

## 2021-03-07 ENCOUNTER — Other Ambulatory Visit: Payer: Self-pay

## 2021-03-07 ENCOUNTER — Encounter (HOSPITAL_COMMUNITY)
Admission: RE | Admit: 2021-03-07 | Discharge: 2021-03-07 | Disposition: A | Discharge status: Home / Self Care | Payer: PPO | Source: Ambulatory Visit | Attending: Thoracic Surgery (Cardiothoracic Vascular Surgery) | Admitting: Thoracic Surgery (Cardiothoracic Vascular Surgery)

## 2021-03-07 ENCOUNTER — Encounter (HOSPITAL_COMMUNITY): Payer: Self-pay

## 2021-03-07 DIAGNOSIS — R911 Solitary pulmonary nodule: Secondary | ICD-10-CM | POA: Diagnosis not present

## 2021-03-07 DIAGNOSIS — Z20822 Contact with and (suspected) exposure to covid-19: Secondary | ICD-10-CM | POA: Insufficient documentation

## 2021-03-07 DIAGNOSIS — Z01818 Encounter for other preprocedural examination: Secondary | ICD-10-CM | POA: Diagnosis not present

## 2021-03-07 LAB — COMPREHENSIVE METABOLIC PANEL
ALT: 12 U/L (ref 0–44)
AST: 26 U/L (ref 15–41)
Albumin: 3.5 g/dL (ref 3.5–5.0)
Alkaline Phosphatase: 90 U/L (ref 38–126)
Anion gap: 7 (ref 5–15)
BUN: 22 mg/dL (ref 8–23)
CO2: 26 mmol/L (ref 22–32)
Calcium: 9.7 mg/dL (ref 8.9–10.3)
Chloride: 105 mmol/L (ref 98–111)
Creatinine, Ser: 1.22 mg/dL (ref 0.61–1.24)
GFR, Estimated: 59 mL/min — ABNORMAL LOW (ref >60)
Glucose, Bld: 80 mg/dL (ref 70–99)
Potassium: 4.4 mmol/L (ref 3.5–5.1)
Sodium: 138 mmol/L (ref 135–145)
Total Bilirubin: 0.4 mg/dL (ref 0.3–1.2)
Total Protein: 6.9 g/dL (ref 6.5–8.1)

## 2021-03-07 LAB — CBC
HCT: 35.3 % — ABNORMAL LOW (ref 39.0–52.0)
Hemoglobin: 10.1 g/dL — ABNORMAL LOW (ref 13.0–17.0)
MCH: 24.6 pg — ABNORMAL LOW (ref 26.0–34.0)
MCHC: 28.6 g/dL — ABNORMAL LOW (ref 30.0–36.0)
MCV: 85.9 fL (ref 80.0–100.0)
Platelets: 383 10*3/uL (ref 150–400)
RBC: 4.11 MIL/uL — ABNORMAL LOW (ref 4.22–5.81)
RDW: 21.9 % — ABNORMAL HIGH (ref 11.5–15.5)
WBC: 6.7 10*3/uL (ref 4.0–10.5)
nRBC: 0 % (ref 0.0–0.2)

## 2021-03-07 LAB — APTT: aPTT: 37 seconds — ABNORMAL HIGH (ref 24–36)

## 2021-03-07 LAB — SARS CORONAVIRUS 2 (TAT 6-24 HRS): SARS Coronavirus 2: NEGATIVE

## 2021-03-07 LAB — PROTIME-INR
INR: 1.1 (ref 0.8–1.2)
Prothrombin Time: 13.7 seconds (ref 11.4–15.2)

## 2021-03-07 NOTE — Progress Notes (Signed)
PCP - Cherly Beach, NP Cardiologist - denies Oncologist: Derek Jack  PPM/ICD - denies   Chest x-ray - n/a EKG - 03/07/21 Stress Test - denies ECHO - denies Cardiac Cath - denies  Sleep Study - denies  No Diabetes   As of today, STOP taking any Aleve, Naproxen, Ibuprofen, Motrin, Advil, Goody's, BC's, all herbal medications, fish oil, and all vitamins.  Follow your surgeon's instructions on when to stop Aspirin.  If no instructions were given by your surgeon then you will need to call the office to get those instructions  ERAS Protcol -no   COVID TEST- 03/07/21   Anesthesia review: no  Patient denies shortness of breath, fever, cough and chest pain at PAT appointment   All instructions explained to the patient, with a verbal understanding of the material. Patient agrees to go over the instructions while at home for a better understanding. Patient also instructed to self quarantine after being tested for COVID-19. The opportunity to ask questions was provided.

## 2021-03-09 ENCOUNTER — Other Ambulatory Visit: Payer: Self-pay

## 2021-03-09 ENCOUNTER — Ambulatory Visit (HOSPITAL_COMMUNITY): Payer: PPO | Admitting: Anesthesiology

## 2021-03-09 ENCOUNTER — Encounter (HOSPITAL_COMMUNITY)
Admission: RE | Disposition: A | Discharge status: Home / Self Care | Payer: Self-pay | Source: Ambulatory Visit | Attending: Thoracic Surgery (Cardiothoracic Vascular Surgery)

## 2021-03-09 ENCOUNTER — Ambulatory Visit (HOSPITAL_COMMUNITY)
Admission: RE | Admit: 2021-03-09 | Discharge: 2021-03-09 | Disposition: A | Discharge status: Home / Self Care | Payer: PPO | Source: Ambulatory Visit | Attending: Thoracic Surgery (Cardiothoracic Vascular Surgery) | Admitting: Thoracic Surgery (Cardiothoracic Vascular Surgery)

## 2021-03-09 ENCOUNTER — Ambulatory Visit (HOSPITAL_COMMUNITY): Payer: PPO

## 2021-03-09 DIAGNOSIS — E7800 Pure hypercholesterolemia, unspecified: Secondary | ICD-10-CM | POA: Diagnosis not present

## 2021-03-09 DIAGNOSIS — Z79899 Other long term (current) drug therapy: Secondary | ICD-10-CM | POA: Insufficient documentation

## 2021-03-09 DIAGNOSIS — J449 Chronic obstructive pulmonary disease, unspecified: Secondary | ICD-10-CM | POA: Diagnosis not present

## 2021-03-09 DIAGNOSIS — R918 Other nonspecific abnormal finding of lung field: Secondary | ICD-10-CM | POA: Diagnosis not present

## 2021-03-09 DIAGNOSIS — Z87891 Personal history of nicotine dependence: Secondary | ICD-10-CM | POA: Insufficient documentation

## 2021-03-09 DIAGNOSIS — Z8673 Personal history of transient ischemic attack (TIA), and cerebral infarction without residual deficits: Secondary | ICD-10-CM | POA: Diagnosis not present

## 2021-03-09 DIAGNOSIS — D500 Iron deficiency anemia secondary to blood loss (chronic): Secondary | ICD-10-CM | POA: Diagnosis not present

## 2021-03-09 DIAGNOSIS — E785 Hyperlipidemia, unspecified: Secondary | ICD-10-CM | POA: Insufficient documentation

## 2021-03-09 DIAGNOSIS — R911 Solitary pulmonary nodule: Secondary | ICD-10-CM

## 2021-03-09 DIAGNOSIS — I10 Essential (primary) hypertension: Secondary | ICD-10-CM | POA: Insufficient documentation

## 2021-03-09 DIAGNOSIS — Z8719 Personal history of other diseases of the digestive system: Secondary | ICD-10-CM | POA: Insufficient documentation

## 2021-03-09 DIAGNOSIS — Z419 Encounter for procedure for purposes other than remedying health state, unspecified: Secondary | ICD-10-CM

## 2021-03-09 DIAGNOSIS — D360 Benign neoplasm of lymph nodes: Secondary | ICD-10-CM | POA: Insufficient documentation

## 2021-03-09 DIAGNOSIS — E559 Vitamin D deficiency, unspecified: Secondary | ICD-10-CM | POA: Diagnosis not present

## 2021-03-09 DIAGNOSIS — C3411 Malignant neoplasm of upper lobe, right bronchus or lung: Secondary | ICD-10-CM | POA: Insufficient documentation

## 2021-03-09 HISTORY — PX: VIDEO BRONCHOSCOPY WITH ENDOBRONCHIAL NAVIGATION: SHX6175

## 2021-03-09 HISTORY — PX: VIDEO BRONCHOSCOPY WITH ENDOBRONCHIAL ULTRASOUND: SHX6177

## 2021-03-09 SURGERY — VIDEO BRONCHOSCOPY WITH ENDOBRONCHIAL NAVIGATION
Anesthesia: General

## 2021-03-09 MED ORDER — LIDOCAINE 2% (20 MG/ML) 5 ML SYRINGE
INTRAMUSCULAR | Status: DC | PRN
  Administered 2021-03-09: 40 mg via INTRAVENOUS

## 2021-03-09 MED ORDER — EPINEPHRINE PF 1 MG/ML IJ SOLN
INTRAMUSCULAR | Status: AC
  Filled 2021-03-09: qty 1

## 2021-03-09 MED ORDER — PROPOFOL 10 MG/ML IV BOLUS
INTRAVENOUS | Status: AC
  Filled 2021-03-09: qty 20

## 2021-03-09 MED ORDER — PROPOFOL 10 MG/ML IV BOLUS
INTRAVENOUS | Status: DC | PRN
  Administered 2021-03-09: 120 mg via INTRAVENOUS

## 2021-03-09 MED ORDER — ROCURONIUM BROMIDE 10 MG/ML (PF) SYRINGE
PREFILLED_SYRINGE | INTRAVENOUS | Status: DC | PRN
  Administered 2021-03-09: 10 mg via INTRAVENOUS
  Administered 2021-03-09: 60 mg via INTRAVENOUS

## 2021-03-09 MED ORDER — DEXAMETHASONE SODIUM PHOSPHATE 10 MG/ML IJ SOLN
INTRAMUSCULAR | Status: AC
  Filled 2021-03-09: qty 1

## 2021-03-09 MED ORDER — OXYCODONE HCL 5 MG/5ML PO SOLN: 5.0000 mg | Freq: Once | ORAL | Status: DC | PRN

## 2021-03-09 MED ORDER — PHENYLEPHRINE HCL-NACL 10-0.9 MG/250ML-% IV SOLN
INTRAVENOUS | Status: DC | PRN
  Administered 2021-03-09: 15 ug/min via INTRAVENOUS

## 2021-03-09 MED ORDER — ONDANSETRON HCL 4 MG/2ML IJ SOLN
4.0000 mg | Freq: Once | INTRAMUSCULAR | Status: DC | PRN
Start: 2021-03-09 — End: 2021-03-09

## 2021-03-09 MED ORDER — FENTANYL CITRATE (PF) 100 MCG/2ML IJ SOLN: 25.0000 ug | INTRAMUSCULAR | Status: DC | PRN

## 2021-03-09 MED ORDER — SUGAMMADEX SODIUM 200 MG/2ML IV SOLN
INTRAVENOUS | Status: DC | PRN
  Administered 2021-03-09: 160 mg via INTRAVENOUS

## 2021-03-09 MED ORDER — ONDANSETRON HCL 4 MG/2ML IJ SOLN
INTRAMUSCULAR | Status: DC | PRN
  Administered 2021-03-09: 4 mg via INTRAVENOUS

## 2021-03-09 MED ORDER — ROCURONIUM BROMIDE 10 MG/ML (PF) SYRINGE
PREFILLED_SYRINGE | INTRAVENOUS | Status: AC
  Filled 2021-03-09: qty 10

## 2021-03-09 MED ORDER — OXYCODONE HCL 5 MG PO TABS
5.0000 mg | ORAL_TABLET | Freq: Once | ORAL | Status: DC | PRN
Start: 2021-03-09 — End: 2021-03-09

## 2021-03-09 MED ORDER — FENTANYL CITRATE (PF) 100 MCG/2ML IJ SOLN
INTRAMUSCULAR | Status: DC | PRN
  Administered 2021-03-09: 50 ug via INTRAVENOUS

## 2021-03-09 MED ORDER — DEXAMETHASONE SODIUM PHOSPHATE 10 MG/ML IJ SOLN
INTRAMUSCULAR | Status: DC | PRN
  Administered 2021-03-09: 4 mg via INTRAVENOUS

## 2021-03-09 MED ORDER — LACTATED RINGERS IV SOLN: INTRAVENOUS | Status: DC

## 2021-03-09 MED ORDER — ONDANSETRON HCL 4 MG/2ML IJ SOLN
INTRAMUSCULAR | Status: AC
  Filled 2021-03-09: qty 2

## 2021-03-09 MED ORDER — CHLORHEXIDINE GLUCONATE 0.12 % MT SOLN
15.0000 mL | Freq: Once | OROMUCOSAL | Status: AC
  Administered 2021-03-09: 15 mL via OROMUCOSAL
  Filled 2021-03-09: qty 15

## 2021-03-09 MED ORDER — ORAL CARE MOUTH RINSE: 15.0000 mL | Freq: Once | OROMUCOSAL | Status: AC

## 2021-03-09 MED ORDER — EPINEPHRINE PF 1 MG/ML IJ SOLN
INTRAMUSCULAR | Status: DC | PRN
  Administered 2021-03-09: 1 mg

## 2021-03-09 MED ORDER — PHENYLEPHRINE 40 MCG/ML (10ML) SYRINGE FOR IV PUSH (FOR BLOOD PRESSURE SUPPORT)
PREFILLED_SYRINGE | INTRAVENOUS | Status: DC | PRN
  Administered 2021-03-09: 120 ug via INTRAVENOUS

## 2021-03-09 MED ORDER — LIDOCAINE 2% (20 MG/ML) 5 ML SYRINGE
INTRAMUSCULAR | Status: AC
  Filled 2021-03-09: qty 5

## 2021-03-09 MED ORDER — FENTANYL CITRATE (PF) 250 MCG/5ML IJ SOLN
INTRAMUSCULAR | Status: AC
  Filled 2021-03-09: qty 5

## 2021-03-09 MED ORDER — 0.9 % SODIUM CHLORIDE (POUR BTL) OPTIME
TOPICAL | Status: DC | PRN
  Administered 2021-03-09: 1000 mL

## 2021-03-09 SURGICAL SUPPLY — 50 items
ADAPTER BRONCHOSCOPE OLYMPUS (ADAPTER) ×2 IMPLANT
ADAPTER VALVE BIOPSY EBUS (MISCELLANEOUS) IMPLANT
ADPTR VALVE BIOPSY EBUS (MISCELLANEOUS)
BLADE CLIPPER SURG (BLADE) ×4 IMPLANT
BRUSH BIOPSY BRONCH 10 SDTNB (MISCELLANEOUS) IMPLANT
BRUSH CYTOL CELLEBRITY 1.5X140 (MISCELLANEOUS) IMPLANT
BRUSH SUPERTRAX BIOPSY (INSTRUMENTS) IMPLANT
BRUSH SUPERTRAX NDL-TIP CYTO (INSTRUMENTS) ×6 IMPLANT
CANISTER SUCT 3000ML PPV (MISCELLANEOUS) ×4 IMPLANT
CNTNR URN SCR LID CUP LEK RST (MISCELLANEOUS) ×3 IMPLANT
CONT SPEC 4OZ STRL OR WHT (MISCELLANEOUS) ×3
COVER BACK TABLE 60X90IN (DRAPES) ×4 IMPLANT
FILTER STRAW FLUID ASPIR (MISCELLANEOUS) IMPLANT
FORCEPS BIOP RJ4 1.8 (CUTTING FORCEPS) IMPLANT
FORCEPS BIOP SUPERTRX PREMAR (INSTRUMENTS) IMPLANT
FORCEPS RADIAL JAW LRG 4 PULM (INSTRUMENTS) IMPLANT
GAUZE SPONGE 4X4 12PLY STRL (GAUZE/BANDAGES/DRESSINGS) ×2 IMPLANT
GLOVE SURG SIGNA 7.5 PF LTX (GLOVE) ×4 IMPLANT
GOWN STRL REUS W/ TWL XL LVL3 (GOWN DISPOSABLE) ×2 IMPLANT
GOWN STRL REUS W/TWL XL LVL3 (GOWN DISPOSABLE) ×2
KIT CLEAN ENDO COMPLIANCE (KITS) ×6 IMPLANT
KIT ILLUMISITE 180 PROCEDURE (KITS) ×2 IMPLANT
KIT ILLUMISITE 90 PROCEDURE (KITS) IMPLANT
KIT TURNOVER KIT B (KITS) ×4 IMPLANT
MARKER SKIN DUAL TIP RULER LAB (MISCELLANEOUS) ×4 IMPLANT
NEEDLE ASPIRATION VIZISHOT 19G (NEEDLE) ×2 IMPLANT
NEEDLE ASPIRATION VIZISHOT 21G (NEEDLE) ×2 IMPLANT
NEEDLE BLUNT 18X1 FOR OR ONLY (NEEDLE) IMPLANT
NEEDLE SUPERTRX PREMARK BIOPSY (NEEDLE) ×4 IMPLANT
NS IRRIG 1000ML POUR BTL (IV SOLUTION) ×4 IMPLANT
OIL SILICONE PENTAX (PARTS (SERVICE/REPAIRS)) ×4 IMPLANT
PAD ARMBOARD 7.5X6 YLW CONV (MISCELLANEOUS) ×8 IMPLANT
PATCHES PATIENT (LABEL) ×6 IMPLANT
RADIAL JAW LRG 4 PULMONARY (INSTRUMENTS)
SYR 20ML ECCENTRIC (SYRINGE) ×4 IMPLANT
SYR 20ML LL LF (SYRINGE) ×6 IMPLANT
SYR 30ML LL (SYRINGE) ×2 IMPLANT
SYR 3ML LL SCALE MARK (SYRINGE) IMPLANT
SYR 5ML LL (SYRINGE) ×4 IMPLANT
SYR 5ML LUER SLIP (SYRINGE) ×2 IMPLANT
SYR TB 1ML LUER SLIP (SYRINGE) IMPLANT
TOWEL GREEN STERILE (TOWEL DISPOSABLE) ×4 IMPLANT
TOWEL GREEN STERILE FF (TOWEL DISPOSABLE) ×4 IMPLANT
TRAP SPECIMEN MUCUS 40CC (MISCELLANEOUS) ×4 IMPLANT
TUBE CONNECTING 20X1/4 (TUBING) ×6 IMPLANT
UNDERPAD 30X36 HEAVY ABSORB (UNDERPADS AND DIAPERS) ×2 IMPLANT
VALVE BIOPSY  SINGLE USE (MISCELLANEOUS) ×2
VALVE BIOPSY SINGLE USE (MISCELLANEOUS) ×2 IMPLANT
VALVE SUCTION BRONCHIO DISP (MISCELLANEOUS) ×6 IMPLANT
WATER STERILE IRR 1000ML POUR (IV SOLUTION) ×4 IMPLANT

## 2021-03-09 NOTE — Discharge Instructions (Addendum)
Do not drive or engage in heavy physical activity for 24 hours.  You may resume normal activities tomorrow  You may cough up small amounts of blood over the next few days.  You may use acetaminophen (Tylenol) if needed for discomfort. You may use an over the counter cough medication of needed.  Call [phone] if you develop chest pain, shortness of breath or cough up more than a tablespoon of blood.  Follow up as scheduled with Dr. Delton Coombes

## 2021-03-09 NOTE — Transfer of Care (Signed)
Immediate Anesthesia Transfer of Care Note  Patient: Jared Schmidt  Procedure(s) Performed: VIDEO BRONCHOSCOPY WITH ENDOBRONCHIAL NAVIGATION (N/A ) VIDEO BRONCHOSCOPY WITH ENDOBRONCHIAL ULTRASOUND (N/A )  Patient Location: PACU  Anesthesia Type:General  Level of Consciousness: awake, alert  and oriented  Airway & Oxygen Therapy: Patient Spontanous Breathing and Patient connected to nasal cannula oxygen  Post-op Assessment: Report given to RN and Post -op Vital signs reviewed and stable  Post vital signs: Reviewed and stable  Last Vitals:  Vitals Value Taken Time  BP 133/78 03/09/21 1327  Temp    Pulse 78 03/09/21 1327  Resp 12 03/09/21 1327  SpO2 100 03/09/21 1327  Vitals shown include unvalidated device data.  Last Pain:  Vitals:   03/09/21 1035  TempSrc:   PainSc: 0-No pain         Complications: No complications documented.

## 2021-03-09 NOTE — Brief Op Note (Signed)
03/09/2021  1:43 PM  PATIENT:  Jared Schmidt  85 y.o. male  PRE-OPERATIVE DIAGNOSIS:  RUL nodules  POST-OPERATIVE DIAGNOSIS:  Non-small cell carcinoma, Clinical stage IIB(T3N0)  PROCEDURE:  Procedure(s): VIDEO BRONCHOSCOPY WITH ENDOBRONCHIAL NAVIGATION (N/A) VIDEO BRONCHOSCOPY WITH ENDOBRONCHIAL ULTRASOUND (N/A) Needle aspirations, brushings and transbronchial biopsies  SURGEON:  Surgeon(s) and Role:    * Melrose Nakayama, MD - Primary  PHYSICIAN ASSISTANT:   ASSISTANTS: none   ANESTHESIA:   general  EBL:  10 mL   BLOOD ADMINISTERED:none  DRAINS: none   LOCAL MEDICATIONS USED:  NONE  SPECIMEN:  Source of Specimen:  Lymph nodes, Right upper lobe anterior and posterior  DISPOSITION OF SPECIMEN:  PATHOLOGY  COUNTS:  NO endo  TOURNIQUET:  * No tourniquets in log *  DICTATION: .Other Dictation: Dictation Number -  PLAN OF CARE: Discharge to home after PACU  PATIENT DISPOSITION:  PACU - hemodynamically stable.   Delay start of Pharmacological VTE agent (>24hrs) due to surgical blood loss or risk of bleeding: not applicable

## 2021-03-09 NOTE — Interval H&P Note (Signed)
History and Physical Interval Note:  [name] has decided to proceed with navigational bronchoscopy and EBUS  03/09/2021 9:40 AM  Jared Schmidt  has presented today for surgery, with the diagnosis of RUL nodules.  The various methods of treatment have been discussed with the patient and family. After consideration of risks, benefits and other options for treatment, the patient has consented to  Procedure(s): VIDEO BRONCHOSCOPY WITH ENDOBRONCHIAL NAVIGATION (N/A) VIDEO BRONCHOSCOPY WITH ENDOBRONCHIAL ULTRASOUND (N/A) as a surgical intervention.  The patient's history has been reviewed, patient examined, no change in status, stable for surgery.  I have reviewed the patient's chart and labs.  Questions were answered to the patient's satisfaction.     Jared Schmidt

## 2021-03-09 NOTE — Anesthesia Procedure Notes (Addendum)
Procedure Name: Intubation Date/Time: 03/09/2021 11:16 AM Performed by: Lowella Dell, CRNA Pre-anesthesia Checklist: Patient identified, Emergency Drugs available, Suction available and Patient being monitored Patient Re-evaluated:Patient Re-evaluated prior to induction Oxygen Delivery Method: Circle System Utilized Preoxygenation: Pre-oxygenation with 100% oxygen Induction Type: IV induction Ventilation: Oral airway inserted - appropriate to patient size and Two handed mask ventilation required Laryngoscope Size: Mac and 4 Grade View: Grade I Tube type: Oral Tube size: 9.0 mm Number of attempts: 1 Airway Equipment and Method: Stylet Placement Confirmation: ETT inserted through vocal cords under direct vision,  positive ETCO2 and breath sounds checked- equal and bilateral Secured at: 22 cm Tube secured with: Tape Dental Injury: Teeth and Oropharynx as per pre-operative assessment

## 2021-03-09 NOTE — Anesthesia Preprocedure Evaluation (Signed)
Anesthesia Evaluation  Patient identified by MRN, date of birth, ID band Patient awake    Reviewed: Allergy & Precautions, NPO status , Patient's Chart, lab work & pertinent test results  Airway Mallampati: II  TM Distance: >3 FB Neck ROM: Full    Dental  (+) Edentulous Lower   Pulmonary former smoker,    breath sounds clear to auscultation       Cardiovascular hypertension,  Rhythm:Regular Rate:Normal     Neuro/Psych    GI/Hepatic   Endo/Other    Renal/GU      Musculoskeletal   Abdominal   Peds  Hematology   Anesthesia Other Findings   Reproductive/Obstetrics                             Anesthesia Physical Anesthesia Plan  ASA: III  Anesthesia Plan: General   Post-op Pain Management:    Induction: Intravenous  PONV Risk Score and Plan: Ondansetron and Dexamethasone  Airway Management Planned: Oral ETT  Additional Equipment:   Intra-op Plan:   Post-operative Plan: Extubation in OR  Informed Consent: I have reviewed the patients History and Physical, chart, labs and discussed the procedure including the risks, benefits and alternatives for the proposed anesthesia with the patient or authorized representative who has indicated his/her understanding and acceptance.       Plan Discussed with: CRNA and Anesthesiologist  Anesthesia Plan Comments:         Anesthesia Quick Evaluation

## 2021-03-09 NOTE — Anesthesia Postprocedure Evaluation (Signed)
Anesthesia Post Note  Patient: WEBSTER PATRONE  Procedure(s) Performed: VIDEO BRONCHOSCOPY WITH ENDOBRONCHIAL NAVIGATION (N/A ) VIDEO BRONCHOSCOPY WITH ENDOBRONCHIAL ULTRASOUND (N/A )     Patient location during evaluation: PACU Anesthesia Type: General Level of consciousness: awake and alert Pain management: pain level controlled Vital Signs Assessment: post-procedure vital signs reviewed and stable Respiratory status: spontaneous breathing, nonlabored ventilation, respiratory function stable and patient connected to nasal cannula oxygen Cardiovascular status: blood pressure returned to baseline and stable Postop Assessment: no apparent nausea or vomiting Anesthetic complications: no   No complications documented.  Last Vitals:  Vitals:   03/09/21 1400 03/09/21 1415  BP: (!) 147/81 128/72  Pulse: 87 86  Resp: 18 19  Temp:  (!) 36.1 C  SpO2: 94% 94%    Last Pain:  Vitals:   03/09/21 1415  TempSrc:   PainSc: 0-No pain                 Jerusalen Mateja COKER

## 2021-03-10 ENCOUNTER — Encounter (HOSPITAL_COMMUNITY): Payer: Self-pay | Admitting: Thoracic Surgery (Cardiothoracic Vascular Surgery)

## 2021-03-10 NOTE — Op Note (Signed)
NAME: Jared, Schmidt MEDICAL RECORD NO: [id number] ACCOUNT NO: [id number] DATE OF BIRTH: Sep 04, 1936 FACILITY: [facility] LOCATION: [facility] PHYSICIAN: Revonda Standard. Roxan Hockey, MD  Operative Report   DATE OF PROCEDURE: 03/09/2021   PREOPERATIVE DIAGNOSIS:  Right upper lobe lung nodules.  POSTOPERATIVE DIAGNOSIS:  Non-small cell carcinoma, right upper lobe, clinical stage IIB (T3 N0).    PROCEDURE:   Electromagnetic navigational bronchoscopy with needle aspirations, brushings and transbronchial biopsies and Endobronchial ultrasound with mediastinal lymph node needle aspirations.  SURGEON: Revonda Standard. Roxan Hockey, MD   ASSISTANT:  None.  ANESTHESIA:  General.  FINDINGS:  Non-small cell carcinoma noted in the posterior right upper lobe mass, atypical cells from anterior right upper lobe mass.  Lymphoid cells, but no tumor seen in lymph nodes.  CLINICAL NOTE: Jared Schmidt is an 85 year old gentleman with numerous medical problems and history of tobacco abuse.  He recently had a CT of the chest, which showed 2 right upper lobe lung masses on PET CT.  These were markedly hypermetabolic.  There  was some mild activity in the hilar and right paratracheal nodes, but no definite pathologic lymph nodes in those areas.  He was advised to undergo navigational bronchoscopy and endobronchial ultrasound for diagnostic and staging purposes.  The  indications, risks, benefits, and alternatives were discussed in detail with the patient.  He understood and accepted the risks and agreed to proceed.  DESCRIPTION OF PROCEDURE: Planning for the navigational bronchoscopy was done on the console prior to induction.  Jared Schmidt was brought to the operating room on 03/09/2021 and induction of general anesthesia and was intubated.  Sequential compression devices were placed on the calves for DVT prophylaxis.  A Bair Hugger was placed for active  warming.    A timeout was performed.  Flexible fiberoptic bronchoscopy was  performed via the endotracheal tube, it revealed normal endobronchial anatomy with no endobronchial lesions to the level of the subsegmental bronchi.  There were thick secretions throughout, which cleared with saline.  The endobronchial ultrasound probe was advanced to the carina and a node was present in the subcarinal space.  Three aspirations were performed, 2 were done without suction and one was done with suction applied.  All node aspirations were done with realtime ultrasound  visualization.  Next, a 4R node was sampled and then finally a level 11R node was sampled. Quick preps of all of these nodes showed lymphoid cells, but no tumor was seen.  The bronchoscope was replaced and the locatable guide for navigation was placed.  Registration was performed.  There was good correlation of the video and virtual bronchoscopy.  The bronchoscope was directed to the right upper lobe orifice and the locatable  guide was advanced to the first target selected, which was the posterior right upper lobe mass.  After coming into proximity with the mass, local registration was performed.  The catheter then was repositioned with good approximation and good  alignment.  All sampling was done with fluoroscopy.  Three needle aspirations were performed followed by three passes with a needle brush as well.  While these specimens were being examined, multiple transbronchial biopsies were obtained.  The catheter did become  dislodged on one occasion and it was replaced and additional transbronchial biopsies were obtained.  There was some mild bleeding with the transbronchial biopsies.  Quick prep on the needle aspirations and brushings showed non-small cell carcinoma.  The locatable guide then was repositioned to the more anterior mass and the sampling was repeated.  Again, local  registration was performed and there was good proximity within 1.2 cm of the center of the lesion and good alignment.  The sampling process was  repeated.  On the quick preps only atypical cells were seen. Multiple transbronchial biopsies were obtained.  There again was some bleeding with the biopsies, which cleared with  instillation of a dilute epinephrine solution and saline.  The locatable guide was removed.  A final inspection was made with the bronchoscope.  There was no ongoing bleeding.  The total fluoroscopy time was 4.3 minutes with a dose of 35 milligrays.  The patient then was  extubated in the operating room and taken to the postanesthetic care unit in good condition.      SUJ D: 03/09/2021 5:11:23 pm T: 03/10/2021 5:58:00 am  JOB: [id number]/ [id number]

## 2021-03-12 LAB — CYTOLOGY - NON PAP

## 2021-03-12 LAB — SURGICAL PATHOLOGY

## 2021-03-19 NOTE — Progress Notes (Signed)
Jared Schmidt, [name] [id number]   CLINIC:  Medical Oncology/Hematology  PCP:  Perlie Mayo, NP [address] / [place] [address] [phone]   REASON FOR VISIT:  Follow-up for right lung cancer  PRIOR THERAPY: none  NGS Results: not done  CURRENT THERAPY: under work-up  BRIEF ONCOLOGIC HISTORY:  Oncology History  Iron deficiency anemia due to chronic blood loss    CANCER STAGING: Cancer Staging No matching staging information was found for the patient.  INTERVAL HISTORY:  [name], a 85 y.o. male, returns for routine follow-up of his right lung cancer. Ebrima was last seen on 01/29/2021.   Today he reports feeling well and reports no complications following his biopsy. He is accompanied by his wife. He denies coughing up blood. He expresses wariness towards chemo, surgery, and radiation. He is currently living in [place].    REVIEW OF SYSTEMS:  Review of Systems  Constitutional: Positive for fatigue (90%). Negative for appetite change.  All other systems reviewed and are negative.   PAST MEDICAL/SURGICAL HISTORY:  Past Medical History:  Diagnosis Date  . Anemia   . Arthritis   . GERD (gastroesophageal reflux disease)   . Hypercholesteremia   . Hypertension   . Iron deficiency anemia due to chronic blood loss 03/17/2018  . Iron deficiency anemia due to chronic blood loss 03/17/2018  . Stroke [facility])    TIA 2020  . Symptomatic anemia 01/20/2018  . Vitamin B deficiency    patient denies   Past Surgical History:  Procedure Laterality Date  . BIOPSY  06/24/2018   Procedure: BIOPSY;  Surgeon: Daneil Dolin, MD;  [facility];  Service: Endoscopy;;  gastric   . COLONOSCOPY N/A 06/24/2018   Dr. Gala Romney: Internal hemorrhoids, diverticulosis, 9 mm polyp removed from the sigmoid colon which was hyperplastic.  Marland Kitchen ESOPHAGOGASTRODUODENOSCOPY N/A 06/24/2018   Dr. Gala Romney: Multiple erosions in the stomach and duodenal  bulb, gastric biopsies with chronic gastritis with intestinal metaplasia, no H. pylori.  . ESOPHAGOGASTRODUODENOSCOPY (EGD) WITH PROPOFOL N/A 09/17/2018   Procedure: ESOPHAGOGASTRODUODENOSCOPY (EGD) WITH PROPOFOL;  Surgeon: Daneil Dolin, MD;  [facility];  Service: Endoscopy;  Laterality: N/A;  11:00am  . GIVENS CAPSULE STUDY N/A 09/15/2018   Procedure: GIVENS CAPSULE STUDY;  Surgeon: Daneil Dolin, MD;  [facility];  Service: Endoscopy;  Laterality: N/A;  7:30am  . none    . POLYPECTOMY  06/24/2018   Procedure: POLYPECTOMY;  Surgeon: Daneil Dolin, MD;  [facility];  Service: Endoscopy;;  sigmoid polyp hs  . VIDEO BRONCHOSCOPY WITH ENDOBRONCHIAL NAVIGATION N/A 03/09/2021   Procedure: VIDEO BRONCHOSCOPY WITH ENDOBRONCHIAL NAVIGATION;  Surgeon: Melrose Nakayama, MD;  Location: [facility];  Service: Thoracic;  Laterality: N/A;  . VIDEO BRONCHOSCOPY WITH ENDOBRONCHIAL ULTRASOUND N/A 03/09/2021   Procedure: VIDEO BRONCHOSCOPY WITH ENDOBRONCHIAL ULTRASOUND;  Surgeon: Melrose Nakayama, MD;  Location: [facility];  Service: Thoracic;  Laterality: N/A;    SOCIAL HISTORY:  Social History   Socioeconomic History  . Marital status: Married    Spouse name: Jared Schmidt  . Number of children: Not on file  . Years of education: Not on file  . Highest education level: 8th grade  Occupational History  . Occupation: retired  Tobacco Use  . Smoking status: Former Smoker    Packs/day: 0.50    Years: 67.00    Pack years: 33.50    Types:  Cigarettes    Quit date: 12/11/2017    Years since quitting: 3.2  . Smokeless tobacco: Never Used  Vaping Use  . Vaping Use: Never used  Substance and Sexual Activity  . Alcohol use: No  . Drug use: No  . Sexual activity: Not Currently  Other Topics Concern  . Not on file  Social History Narrative   Lives with wife [name] in [place]   Dog named poppet      Enjoys hanging around [employer]: eats all food groups    Caffeine: coffee   Schmidt: 4-5 cups daily      Wears seat belt    Does not use phone while driving    Oceanographer at home    Social Determinants of Health   Financial Resource Strain: Low Risk   . Difficulty of Paying Living Expenses: Not hard at all  Food Insecurity: No Food Insecurity  . Worried About Charity fundraiser in the Last Year: Never true  . Ran Out of Food in the Last Year: Never true  Transportation Needs: No Transportation Needs  . Lack of Transportation (Medical): No  . Lack of Transportation (Non-Medical): No  Physical Activity: Inactive  . Days of Exercise per Week: 0 days  . Minutes of Exercise per Session: 0 min  Stress: No Stress Concern Present  . Feeling of Stress : Only a little  Social Connections: Socially Isolated  . Frequency of Communication with Friends and Family: Once a week  . Frequency of Social Gatherings with Friends and Family: Never  . Attends Religious Services: Never  . Active Member of Clubs or Organizations: No  . Attends Archivist Meetings: Never  . Marital Status: Married  Human resources officer Violence: Not At Risk  . Fear of Current or Ex-Partner: No  . Emotionally Abused: No  . Physically Abused: No  . Sexually Abused: No    FAMILY HISTORY:  Family History  Problem Relation Age of Onset  . Colon cancer Neg Hx     CURRENT MEDICATIONS:  Current Outpatient Medications  Medication Sig Dispense Refill  . acetaminophen (TYLENOL) 500 MG tablet Take 500 mg by mouth every 6 (six) hours as needed for moderate pain or mild pain.    Marland Kitchen amLODipine (NORVASC) 5 MG tablet Take 1 tablet (5 mg total) by mouth daily. 30 tablet 5  . aspirin EC 81 MG tablet Take 81 mg by mouth daily. Swallow whole.    Marland Kitchen atorvastatin (LIPITOR) 10 MG tablet Take 1 tablet (10 mg total) by mouth daily with supper. 90 tablet 3  . carvedilol (COREG) 12.5 MG tablet Take 1 tablet (12.5 mg total) by mouth 2 (two) times daily with a meal. 30 tablet 3  .  Multiple Vitamins-Minerals (MULTIVITAMIN WITH MINERALS) tablet Take 1 tablet by mouth daily.    . pantoprazole (PROTONIX) 40 MG tablet TAKE 1 TABLET BY MOUTH EVERY DAY (Patient taking differently: Take 40 mg by mouth daily.) 90 tablet 0  . vitamin C (ASCORBIC ACID) 500 MG tablet Take 500 mg by mouth daily.    . vitamin E 100 UNIT capsule Take 100 Units by mouth daily.      No current facility-administered medications for this visit.    ALLERGIES:  No Known Allergies  PHYSICAL EXAM:  Performance status (ECOG): 1 - Symptomatic but completely ambulatory  There were no vitals filed for this visit. Wt Readings from Last 3 Encounters:  03/09/21 138 lb  7.2 oz (62.8 kg)  03/07/21 138 lb 6 oz (62.8 kg)  02/20/21 138 lb (62.6 kg)   Physical Exam Vitals reviewed.  Constitutional:      Appearance: Normal appearance.  Cardiovascular:     Rate and Rhythm: Normal rate and regular rhythm.     Pulses: Normal pulses.     Heart sounds: Normal heart sounds.  Pulmonary:     Effort: Pulmonary effort is normal.     Breath sounds: Normal breath sounds.  Neurological:     General: No focal deficit present.     Mental Status: He is alert and oriented to person, place, and time.  Psychiatric:        Mood and Affect: Mood normal.        Behavior: Behavior normal.      LABORATORY DATA:  I have reviewed the labs as listed.  CBC Latest Ref Rng & Units 03/07/2021 02/06/2021 01/17/2021  WBC 4.0 - 10.5 K/uL 6.7 10.2 5.8  Hemoglobin 13.0 - 17.0 g/dL 10.1(L) 10.5(L) 9.6(L)  Hematocrit 39.0 - 52.0 % 35.3(L) 36.4(L) 33.6(L)  Platelets 150 - 400 K/uL 383 377 352   CMP Latest Ref Rng & Units 03/07/2021 02/06/2021 01/17/2021  Glucose 70 - 99 mg/dL 80 83 93  BUN 8 - 23 mg/dL '22 16 20  ' Creatinine 0.61 - 1.24 mg/dL 1.22 1.17 1.42(H)  Sodium 135 - 145 mmol/L 138 143 141  Potassium 3.5 - 5.1 mmol/L 4.4 5.3(H) 4.4  Chloride 98 - 111 mmol/L 105 106 109  CO2 22 - 32 mmol/L '26 25 23  ' Calcium 8.9 - 10.3 mg/dL 9.7 9.7 9.1   Total Protein 6.5 - 8.1 g/dL 6.9 6.6 6.9  Total Bilirubin 0.3 - 1.2 mg/dL 0.4 <0.2 0.3  Alkaline Phos 38 - 126 U/L 90 109 84  AST 15 - 41 U/L '26 26 21  ' ALT 0 - 44 U/L '12 13 13    ' DIAGNOSTIC IMAGING:  I have independently reviewed the scans and discussed with the patient. DG Chest 2 View  Result Date: 03/09/2021 CLINICAL DATA:  Preop bronchoscopy EXAM: CHEST - 2 VIEW COMPARISON:  CT chest 01/13/2021 FINDINGS: Stable 2.1 cm right upper lobe pulmonary nodule and adjacent 2.4 cm right perihilar nodule. No focal consolidation. No pleural effusion or pneumothorax. Heart and mediastinal contours are unremarkable. No acute osseous abnormality. IMPRESSION: Stable 2.1 cm right upper lobe pulmonary nodule and adjacent 2.4 cm right perihilar nodule. No acute cardiopulmonary disease. Electronically Signed   By: Kathreen Devoid   On: 03/09/2021 11:18   DG C-ARM BRONCHOSCOPY  Result Date: 03/09/2021 C-ARM BRONCHOSCOPY: Fluoroscopy was utilized by the requesting physician.  No radiographic interpretation.     ASSESSMENT:  1.Right lung masses concerning for possible malignancy -Presented to emergency department on 01/13/21 with hemoptysis -CT angiography of chest on 01/13/21 showed heterogeneous noncalcified right upper lob lung masses,measuring 2.8 cm x 1.7 cm and 3.1 cm x 2.3 cm, which are concerning for underlying neoplastic process.(These were not present on CT chest with contrast in September 2019) -PET scan was performed on 01/25/2021 - both right upper lobe lung masses are hypermetabolic and consistent with malignancy, with a mildly hypermetabolic but small right hilar lymph node, probably involved; faintly accentuated metabolic of right paratracheal lymph node.  No findings of extra thoracic metastatic disease on PET scan. -Prostatomegaly was noted, with accentuated activity consistent with inflammation, although correlation with a PSA level is suggested. -MRI brain on 01/18/2021 showed a punctate  focus of enhancement in  the left precentral gyrus, which is favored by radiologist to favor artifact, but follow-up MRI recommended to exclude metastasis -Patient admits to worsening fatigue and intermittent hemoptysis , but denies unexpected weight loss and other B-symptoms -No family history of lung cancer -50-pack year history of tobacco use, quit smoking in 2012  2.Iron deficiency anemia due to chronic blood lossand malabsorption -Likely from combination of chronic GI blood loss and malabsorption, as well as anemia in the setting of possible malignancy (see above) -EGD on 09/17/2018 shows normal esophagus, blood in the entire stomach, 3 actively bleeding AVMs versus Dieulafoy's sealed with a clips. Normal duodenal bulb and second part of duodenum. -Colonoscopy on 06/24/2018 showed internal hemorrhoids, few medium mouth diverticula in the entire colon. Subcentimeter polyp in the sigmoid colon. -Bone marrow biopsy on 08/21/2018 showed normocellular marrow with erythroid hyperplasia. No increase in plasma cells. -Denies any bright red bleeding per rectum or melena. -Has been taking oral iron, but with no improvement in iron saturation, likely poor absorption in the setting of concurrent PPI use -Patient is taking daily aspirin 81 mg, but is uncertain of the reason why. He denies coronary artery disease, atrial fibrillation, and stroke. However, review of brain imaging does show signs of old infarcts. -Review of labs from 01/17/2021 show hemoglobin 9.6, ferritin 37, and serum iron 40, saturation 11%. -Scheduled for IV Feraheme x 2 at last appointment (01/18/2021)  3. Health maintenance  -Incidental findings on PET scan include infrarenal abdominal aortic aneurysm (3.6 cm, recommended for follow-up US ever 2 years; remote left posterior cerebellar infarct; emphysema; coronary atherosclerosis; left adrenal adenoma.   PLAN:  1.  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 Roxan Hockey on 03/09/2021. - Reviewed biopsy results which showed squamous cell carcinoma of both the lung masses. - Lymph nodes at stations 7, 4R and 11 R showed no malignant cells identified.  However scant lymphoid tissue present. - I will reach out to Dr. Roxan Hockey about further management. - Initially he declined surgery but agreed now.  We will ask Dr. Roxan Hockey if he is a candidate for surgery based on his PFTs. - If he is not a candidate, next best option would be SBRT of the lung lesions if we believe that lymph nodes are true negative given the scant lymphoid tissue present. - If there is high clinical suspicion for positive lymph nodes, he will be candidate for chemoradiation therapy.  We will also make referral to radiation oncology. - We will send tumor tissue for PD-L1 and other mutation testing. - We will see him back in 7 to 10 days to discuss.   2.Iron deficiency anemia due to chronic blood lossand malabsorption: - Last Feraheme on 01/26/2021 and 02/02/2021. - Hemoglobin on 03/07/2021 at 10.1.     Orders placed this encounter:  No orders of the defined types were placed in this encounter.  Total time spent is 40 minutes with more than 50% of the time spent face-to-face discussing biopsy results, treatment plan, counseling and coordination of care.   Derek Jack, MD [facility] [phone]   I, Thana Ates, am acting as a scribe for Dr. Derek Jack.  I, Derek Jack MD, have reviewed the above documentation for accuracy and completeness, and I agree with the above.

## 2021-03-20 ENCOUNTER — Other Ambulatory Visit: Payer: Self-pay

## 2021-03-20 ENCOUNTER — Inpatient Hospital Stay (HOSPITAL_COMMUNITY): Payer: PPO | Attending: Hematology | Admitting: Hematology

## 2021-03-20 VITALS — BP 135/66 | HR 82 | Temp 97.7°F | Resp 18 | Wt 136.3 lb

## 2021-03-20 DIAGNOSIS — E539 Vitamin B deficiency, unspecified: Secondary | ICD-10-CM | POA: Diagnosis not present

## 2021-03-20 DIAGNOSIS — Z8673 Personal history of transient ischemic attack (TIA), and cerebral infarction without residual deficits: Secondary | ICD-10-CM | POA: Insufficient documentation

## 2021-03-20 DIAGNOSIS — Z87891 Personal history of nicotine dependence: Secondary | ICD-10-CM | POA: Insufficient documentation

## 2021-03-20 DIAGNOSIS — C3491 Malignant neoplasm of unspecified part of right bronchus or lung: Secondary | ICD-10-CM | POA: Insufficient documentation

## 2021-03-20 DIAGNOSIS — K219 Gastro-esophageal reflux disease without esophagitis: Secondary | ICD-10-CM | POA: Diagnosis not present

## 2021-03-20 DIAGNOSIS — R918 Other nonspecific abnormal finding of lung field: Secondary | ICD-10-CM

## 2021-03-20 DIAGNOSIS — D509 Iron deficiency anemia, unspecified: Secondary | ICD-10-CM | POA: Insufficient documentation

## 2021-03-20 DIAGNOSIS — I10 Essential (primary) hypertension: Secondary | ICD-10-CM | POA: Insufficient documentation

## 2021-03-20 DIAGNOSIS — Z7982 Long term (current) use of aspirin: Secondary | ICD-10-CM | POA: Insufficient documentation

## 2021-03-20 NOTE — Patient Instructions (Signed)
[place] at [facility] Discharge Instructions  You were seen today by Dr. Delton Coombes. He went over your recent results. Your diagnosis is squamous cell cancer of the right lung. You will be referred for a radiation consultation in [place]. Dr. Delton Coombes will see you back in 7-10 days for labs and follow up.   Thank you for choosing [place] at [facility] to provide your oncology and hematology care.  To afford each patient quality time with our provider, please arrive at least 15 minutes before your scheduled appointment time.   If you have a lab appointment with the [facility] please come in thru the Main Entrance and check in at the main information desk  You need to re-schedule your appointment should you arrive 10 or more minutes late.  We strive to give you quality time with our providers, and arriving late affects you and other patients whose appointments are after yours.  Also, if you no show three or more times for appointments you may be dismissed from the clinic at the providers discretion.     Again, thank you for choosing [facility].  Our hope is that these requests will decrease the amount of time that you wait before being seen by our physicians.       _____________________________________________________________  Should you have questions after your visit to [facility], please contact our office at [phone] between the hours of 8:00 a.m. and 4:30 p.m.  Voicemails left after 4:00 p.m. will not be returned until the following business day.  For prescription refill requests, have your pharmacy contact our office and allow 72 hours.    Cancer Center Support Programs:   > Cancer Support Group  2nd Tuesday of the month 1pm-2pm, Journey Room

## 2021-03-21 ENCOUNTER — Encounter (HOSPITAL_COMMUNITY): Payer: Self-pay

## 2021-03-21 ENCOUNTER — Encounter (HOSPITAL_COMMUNITY): Payer: Self-pay | Admitting: Lab

## 2021-03-21 NOTE — Progress Notes (Unsigned)
Referral sent to Rad onc [facility].  Records faxed on 5/18

## 2021-03-21 NOTE — Progress Notes (Signed)
Caris testing order per Dr. Tomie China request

## 2021-03-22 ENCOUNTER — Other Ambulatory Visit: Payer: Self-pay | Admitting: *Deleted

## 2021-03-22 NOTE — Progress Notes (Signed)
The proposed treatment discussed in cancer conference is for discussion purpose only and is not a binding recommendation. The patient was not physically examined nor present for their treatment options. Therefore, final treatment plans cannot be decided.  ?

## 2021-03-28 NOTE — Progress Notes (Signed)
Jared Schmidt, Jared Schmidt   CLINIC:  Medical Oncology/Hematology  PCP:  Jared Mayo, NP [address] / [place] [address] [phone]   REASON FOR VISIT:  Follow-up for right lung cancer  PRIOR THERAPY: none  NGS Results: not done  CURRENT THERAPY: under work-up  BRIEF ONCOLOGIC HISTORY:  Oncology History  Iron deficiency anemia due to chronic blood loss    CANCER STAGING: Cancer Staging Squamous cell lung cancer, right ([place]) Staging form: Lung, AJCC 8th Edition - Clinical stage from 03/20/2021: Stage IIB (cT3, cN0, cM0) - Unsigned   INTERVAL HISTORY:  [name], a 85 y.o. male, returns for routine follow-up of his right lung cancer. [name] was last seen on 03/20/2021.   Today he reports feeling okay. He reports that he has had a cough, and a pain in his left chest began following this cough; the cough has not produced sputum. He was newly diagnosed with asthma.   REVIEW OF SYSTEMS:  Review of Systems  Constitutional: Positive for appetite change (75%) and fatigue (depleted).  Respiratory: Positive for shortness of breath.   Cardiovascular: Positive for chest pain (L).  Neurological: Positive for light-headedness (occasional).  All other systems reviewed and are negative.   PAST MEDICAL/SURGICAL HISTORY:  Past Medical History:  Diagnosis Date  . Anemia   . Arthritis   . GERD (gastroesophageal reflux disease)   . Hypercholesteremia   . Hypertension   . Iron deficiency anemia due to chronic blood loss 03/17/2018  . Iron deficiency anemia due to chronic blood loss 03/17/2018  . Stroke [facility])    TIA 2020  . Symptomatic anemia 01/20/2018  . Vitamin B deficiency    patient denies   Past Surgical History:  Procedure Laterality Date  . BIOPSY  06/24/2018   Procedure: BIOPSY;  Surgeon: Jared Dolin, MD;  [facility];  Service: Endoscopy;;  gastric   . COLONOSCOPY N/A 06/24/2018   Dr. Gala Schmidt:  Internal hemorrhoids, diverticulosis, 9 mm polyp removed from the sigmoid colon which was hyperplastic.  Marland Kitchen ESOPHAGOGASTRODUODENOSCOPY N/A 06/24/2018   Dr. Gala Schmidt: Multiple erosions in the stomach and duodenal bulb, gastric biopsies with chronic gastritis with intestinal metaplasia, no H. pylori.  . ESOPHAGOGASTRODUODENOSCOPY (EGD) WITH PROPOFOL N/A 09/17/2018   Procedure: ESOPHAGOGASTRODUODENOSCOPY (EGD) WITH PROPOFOL;  Surgeon: Jared Dolin, MD;  [facility];  Service: Endoscopy;  Laterality: N/A;  11:00am  . GIVENS CAPSULE STUDY N/A 09/15/2018   Procedure: GIVENS CAPSULE STUDY;  Surgeon: Jared Dolin, MD;  [facility];  Service: Endoscopy;  Laterality: N/A;  7:30am  . none    . POLYPECTOMY  06/24/2018   Procedure: POLYPECTOMY;  Surgeon: Jared Dolin, MD;  [facility];  Service: Endoscopy;;  sigmoid polyp hs  . VIDEO BRONCHOSCOPY WITH ENDOBRONCHIAL NAVIGATION N/A 03/09/2021   Procedure: VIDEO BRONCHOSCOPY WITH ENDOBRONCHIAL NAVIGATION;  Surgeon: Jared Nakayama, MD;  Location: [place];  Service: Thoracic;  Laterality: N/A;  . VIDEO BRONCHOSCOPY WITH ENDOBRONCHIAL ULTRASOUND N/A 03/09/2021   Procedure: VIDEO BRONCHOSCOPY WITH ENDOBRONCHIAL ULTRASOUND;  Surgeon: Jared Nakayama, MD;  Location: [place];  Service: Thoracic;  Laterality: N/A;    SOCIAL HISTORY:  Social History   Socioeconomic History  . Marital status: Married    Spouse name: Jared Schmidt  . Number of children: Not on file  . Years of education: Not on file  . Highest education level: 8th grade  Occupational History  . Occupation: retired  Tobacco Use  . Smoking status: Former Smoker    Packs/day: 0.50    Years: 67.00    Pack years: 33.50    Types: Cigarettes    Quit date: 12/11/2017    Years since quitting: 3.2  . Smokeless tobacco: Never Used  Vaping Use  . Vaping Use: Never used  Substance and Sexual Activity  . Alcohol use: No  . Drug use: No  . Sexual activity:  Not Currently  Other Topics Concern  . Not on file  Social History Narrative   Lives with wife Jared Schmidt in [place]   Dog named poppet      Enjoys hanging around [employer]: eats all food groups   Caffeine: coffee   Water: 4-5 cups daily      Wears seat belt    Does not use phone while driving    Oceanographer at home    Social Determinants of Health   Financial Resource Strain: Low Risk   . Difficulty of Paying Living Expenses: Not hard at all  Food Insecurity: No Food Insecurity  . Worried About Charity fundraiser in the Last Year: Never true  . Ran Out of Food in the Last Year: Never true  Transportation Needs: No Transportation Needs  . Lack of Transportation (Medical): No  . Lack of Transportation (Non-Medical): No  Physical Activity: Inactive  . Days of Exercise per Week: 0 days  . Minutes of Exercise per Session: 0 min  Stress: No Stress Concern Present  . Feeling of Stress : Only a little  Social Connections: Socially Isolated  . Frequency of Communication with Friends and Family: Once a week  . Frequency of Social Gatherings with Friends and Family: Never  . Attends Religious Services: Never  . Active Member of Clubs or Organizations: No  . Attends Archivist Meetings: Never  . Marital Status: Married  Human resources officer Violence: Not At Risk  . Fear of Current or Ex-Partner: No  . Emotionally Abused: No  . Physically Abused: No  . Sexually Abused: No    FAMILY HISTORY:  Family History  Problem Relation Age of Onset  . Colon cancer Neg Hx     CURRENT MEDICATIONS:  Current Outpatient Medications  Medication Sig Dispense Refill  . acetaminophen (TYLENOL) 500 MG tablet Take 500 mg by mouth every 6 (six) hours as needed for moderate pain or mild pain.    Marland Kitchen amLODipine (NORVASC) 5 MG tablet Take 1 tablet (5 mg total) by mouth daily. 30 tablet 5  . aspirin EC 81 MG tablet Take 81 mg by mouth daily. Swallow whole.    Marland Kitchen atorvastatin (LIPITOR) 10  MG tablet Take 1 tablet (10 mg total) by mouth daily with supper. 90 tablet 3  . carvedilol (COREG) 12.5 MG tablet Take 1 tablet (12.5 mg total) by mouth 2 (two) times daily with a meal. 30 tablet 3  . Multiple Vitamins-Minerals (MULTIVITAMIN WITH MINERALS) tablet Take 1 tablet by mouth daily.    . pantoprazole (PROTONIX) 40 MG tablet TAKE 1 TABLET BY MOUTH EVERY DAY (Patient taking differently: Take 40 mg by mouth daily.) 90 tablet 0  . vitamin C (ASCORBIC ACID) 500 MG tablet Take 500 mg by mouth daily.    . vitamin E 100 UNIT capsule Take 100 Units by mouth daily.      No current facility-administered medications for this visit.    ALLERGIES:  No Known Allergies  PHYSICAL EXAM:  Performance status (ECOG): 1 - Symptomatic but completely ambulatory  There were no vitals filed for this visit. Wt Readings from Last 3 Encounters:  03/20/21 136 lb 4.8 oz (61.8 kg)  03/09/21 138 lb 7.2 oz (62.8 kg)  03/07/21 138 lb 6 oz (62.8 kg)   Physical Exam Vitals reviewed.  Constitutional:      Appearance: Normal appearance.  Cardiovascular:     Rate and Rhythm: Normal rate and regular rhythm.     Pulses: Normal pulses.     Heart sounds: Normal heart sounds.  Pulmonary:     Effort: Pulmonary effort is normal.     Breath sounds: Normal breath sounds.  Chest:  Breasts:     Left: No tenderness.    Neurological:     General: No focal deficit present.     Mental Status: He is alert and oriented to person, place, and time.  Psychiatric:        Mood and Affect: Mood normal.        Behavior: Behavior normal.      LABORATORY DATA:  I have reviewed the labs as listed.  CBC Latest Ref Rng & Units 03/07/2021 02/06/2021 01/17/2021  WBC 4.0 - 10.5 K/uL 6.7 10.2 5.8  Hemoglobin 13.0 - 17.0 g/dL 10.1(L) 10.5(L) 9.6(L)  Hematocrit 39.0 - 52.0 % 35.3(L) 36.4(L) 33.6(L)  Platelets 150 - 400 K/uL 383 377 352   CMP Latest Ref Rng & Units 03/07/2021 02/06/2021 01/17/2021  Glucose 70 - 99 mg/dL 80 83 93  BUN  8 - 23 mg/dL '22 16 20  ' Creatinine 0.61 - 1.24 mg/dL 1.22 1.17 1.42(H)  Sodium 135 - 145 mmol/L 138 143 141  Potassium 3.5 - 5.1 mmol/L 4.4 5.3(H) 4.4  Chloride 98 - 111 mmol/L 105 106 109  CO2 22 - 32 mmol/L '26 25 23  ' Calcium 8.9 - 10.3 mg/dL 9.7 9.7 9.1  Total Protein 6.5 - 8.1 g/dL 6.9 6.6 6.9  Total Bilirubin 0.3 - 1.2 mg/dL 0.4 <0.2 0.3  Alkaline Phos 38 - 126 U/L 90 109 84  AST 15 - 41 U/L '26 26 21  ' ALT 0 - 44 U/L '12 13 13    ' DIAGNOSTIC IMAGING:  I have independently reviewed the scans and discussed with the patient. DG Chest 2 View  Result Date: 03/09/2021 CLINICAL DATA:  Preop bronchoscopy EXAM: CHEST - 2 VIEW COMPARISON:  CT chest 01/13/2021 FINDINGS: Stable 2.1 cm right upper lobe pulmonary nodule and adjacent 2.4 cm right perihilar nodule. No focal consolidation. No pleural effusion or pneumothorax. Heart and mediastinal contours are unremarkable. No acute osseous abnormality. IMPRESSION: Stable 2.1 cm right upper lobe pulmonary nodule and adjacent 2.4 cm right perihilar nodule. No acute cardiopulmonary disease. Electronically Signed   By: Kathreen Devoid   On: 03/09/2021 11:18   DG C-ARM BRONCHOSCOPY  Result Date: 03/09/2021 C-ARM BRONCHOSCOPY: Fluoroscopy was utilized by the requesting physician.  No radiographic interpretation.     ASSESSMENT:  1.Right lung masses concerning for possible malignancy -Presented to emergency department on 01/13/21 with hemoptysis -CT angiography of chest on 01/13/21 showed heterogeneous noncalcified right upper lob lung masses,measuring 2.8 cm x 1.7 cm and 3.1 cm x 2.3 cm, which are concerning for underlying neoplastic process.(These were not present on CT chest with contrast in September 2019) -PET scan was performed on 01/25/2021 - both right upper lobe lung masses are hypermetabolic and consistent with malignancy, with a mildly hypermetabolic but small right hilar lymph node,  probably involved; faintly accentuated metabolic of right  paratracheal lymph node. No findings of extra thoracic metastatic disease on PET scan. -Prostatomegaly was noted, with accentuated activity consistent with inflammation, although correlation with a PSA level is suggested. -MRI brain on 01/18/2021 showed a punctate focus of enhancement in the left precentral gyrus, which is favored by radiologist to favor artifact, but follow-up MRI recommendedto exclude metastasis -Patient admits to worsening fatigueand intermittent hemoptysis, but denies unexpected weight loss and other B-symptoms -No family history of lung cancer -50-pack year history of tobacco use, quit smoking in 2012 -He underwent bronchoscopy and biopsy by Dr. Roxan Hockey on 03/09/2021. - Reviewed biopsy results which showed squamous cell carcinoma of both the lung masses. - Lymph nodes at stations 7, 4R and 11 R showed no malignant cells identified.  However scant lymphoid tissue present.  2.Iron deficiency anemia due to chronic blood lossand malabsorption -Likely from combination of chronic GI blood loss and malabsorption, as well as anemia in the setting of possible malignancy (see above) -EGD on 09/17/2018 shows normal esophagus, blood in the entire stomach, 3 actively bleeding AVMs versus Dieulafoy's sealed with a clips. Normal duodenal bulb and second part of duodenum. -Colonoscopy on 06/24/2018 showed internal hemorrhoids, few medium mouth diverticula in the entire colon. Subcentimeter polyp in the sigmoid colon. -Bone marrow biopsy on 08/21/2018 showed normocellular marrow with erythroid hyperplasia. No increase in plasma cells. -Denies any bright red bleeding per rectum or melena. -Has been taking oral iron, but with no improvement in iron saturation, likely poor absorption in the setting of concurrent PPI use -Patient is taking daily aspirin 81 mg, but is uncertain of the reason why. He denies coronary artery disease, atrial fibrillation, and stroke. However, review of  brain imaging does show signs of old infarcts. -Review of labs from 01/17/2021 show hemoglobin 9.6, ferritin 37, and serum iron 40,saturation 11%. -Scheduled for IV Feraheme x 2 at last appointment (01/18/2021)  3. Health maintenance -Incidental findingson PET scaninclude infrarenal abdominal aortic aneurysm (3.6 cm, recommended for follow-up US ever 2 years; remote left posterior cerebellar infarct; emphysema; coronary atherosclerosis; left adrenal adenoma.   PLAN:  1.  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 Roxan Hockey.  He is not a surgical candidate based on his severely low diffusion capacity. - I will make a referral to [facility] radiation oncology for SBRT of the lung lesions. - We will send tumor tissue for PD-L1 and other mutations. - RTC 6 weeks for follow-up.   2.Iron deficiency anemia due to chronic blood lossand malabsorption: - Last Feraheme on 01/26/2021 on 02/02/2021. - Hemoglobin is 10.1. - We will plan to repeat ferritin, iron panel and CBC in 6 weeks.   Orders placed this encounter:  No orders of the defined types were placed in this encounter.    Derek Jack, MD [place] [phone]   I, Thana Ates, am acting as a scribe for Dr. Derek Jack.  I, Derek Jack MD, have reviewed the above documentation for accuracy and completeness, and I agree with the above.

## 2021-03-29 ENCOUNTER — Inpatient Hospital Stay (HOSPITAL_BASED_OUTPATIENT_CLINIC_OR_DEPARTMENT_OTHER): Payer: PPO | Admitting: Hematology

## 2021-03-29 ENCOUNTER — Other Ambulatory Visit: Payer: Self-pay

## 2021-03-29 ENCOUNTER — Other Ambulatory Visit: Payer: Self-pay | Admitting: Family Medicine

## 2021-03-29 VITALS — BP 144/82 | HR 81 | Temp 96.8°F | Resp 18 | Wt 135.9 lb

## 2021-03-29 DIAGNOSIS — R918 Other nonspecific abnormal finding of lung field: Secondary | ICD-10-CM

## 2021-03-29 DIAGNOSIS — C3491 Malignant neoplasm of unspecified part of right bronchus or lung: Secondary | ICD-10-CM

## 2021-03-29 DIAGNOSIS — K219 Gastro-esophageal reflux disease without esophagitis: Secondary | ICD-10-CM

## 2021-03-29 NOTE — Patient Instructions (Signed)
[place] at [facility] Discharge Instructions  You were seen today by Dr. Delton Coombes. He went over your recent results and scans. Dr. Delton Coombes will see you back in 6 weeks for labs and follow up.   Thank you for choosing [place] at [facility] to provide your oncology and hematology care.  To afford each patient quality time with our provider, please arrive at least 15 minutes before your scheduled appointment time.   If you have a lab appointment with the [place] please come in thru the Main Entrance and check in at the main information desk  You need to re-schedule your appointment should you arrive 10 or more minutes late.  We strive to give you quality time with our providers, and arriving late affects you and other patients whose appointments are after yours.  Also, if you no show three or more times for appointments you may be dismissed from the clinic at the providers discretion.     Again, thank you for choosing [facility].  Our hope is that these requests will decrease the amount of time that you wait before being seen by our physicians.       _____________________________________________________________  Should you have questions after your visit to [facility], please contact our office at [phone] between the hours of 8:00 a.m. and 4:30 p.m.  Voicemails left after 4:00 p.m. will not be returned until the following business day.  For prescription refill requests, have your pharmacy contact our office and allow 72 hours.    Cancer Center Support Programs:   > Cancer Support Group  2nd Tuesday of the month 1pm-2pm, Journey Room

## 2021-03-30 ENCOUNTER — Encounter (HOSPITAL_COMMUNITY): Payer: Self-pay | Admitting: Hematology

## 2021-04-06 DIAGNOSIS — C3411 Malignant neoplasm of upper lobe, right bronchus or lung: Secondary | ICD-10-CM | POA: Diagnosis not present

## 2021-04-06 DIAGNOSIS — Z87891 Personal history of nicotine dependence: Secondary | ICD-10-CM | POA: Diagnosis not present

## 2021-04-08 DIAGNOSIS — Z87891 Personal history of nicotine dependence: Secondary | ICD-10-CM | POA: Diagnosis not present

## 2021-04-08 DIAGNOSIS — C3411 Malignant neoplasm of upper lobe, right bronchus or lung: Secondary | ICD-10-CM | POA: Diagnosis not present

## 2021-04-10 ENCOUNTER — Other Ambulatory Visit: Payer: Self-pay | Admitting: Family Medicine

## 2021-04-10 ENCOUNTER — Encounter (HOSPITAL_COMMUNITY): Payer: Self-pay

## 2021-04-10 DIAGNOSIS — I10 Essential (primary) hypertension: Secondary | ICD-10-CM

## 2021-04-23 ENCOUNTER — Encounter: Payer: Self-pay | Admitting: *Deleted

## 2021-04-23 ENCOUNTER — Other Ambulatory Visit: Payer: Self-pay

## 2021-04-23 ENCOUNTER — Other Ambulatory Visit (HOSPITAL_COMMUNITY): Payer: Self-pay

## 2021-04-23 ENCOUNTER — Other Ambulatory Visit (HOSPITAL_COMMUNITY): Payer: Self-pay | Admitting: Hematology

## 2021-04-23 ENCOUNTER — Ambulatory Visit (HOSPITAL_COMMUNITY)
Admission: RE | Admit: 2021-04-23 | Discharge: 2021-04-23 | Disposition: A | Discharge status: Home / Self Care | Payer: PPO | Source: Ambulatory Visit | Attending: Hematology | Admitting: Hematology

## 2021-04-23 DIAGNOSIS — C3491 Malignant neoplasm of unspecified part of right bronchus or lung: Secondary | ICD-10-CM

## 2021-04-23 DIAGNOSIS — C3490 Malignant neoplasm of unspecified part of unspecified bronchus or lung: Secondary | ICD-10-CM | POA: Diagnosis not present

## 2021-04-23 NOTE — Progress Notes (Signed)
Patient called reporting increased work of breathing with new L sided lung pain, requesting chest X-Ray. Order placed, Dr. Delton Coombes made aware

## 2021-04-23 NOTE — Progress Notes (Signed)
I reached out to Dr. Delton Coombes last week regarding Mr. Stapel needing to be seen with Rad Onc per cancer conference discussion.  He updated me today that he would like patient to be seen with Rad onc here.  Patient is already scheduled to see Dr. Tammi Klippel next week.

## 2021-04-26 NOTE — Progress Notes (Signed)
Thoracic Location of Tumor / Histology:Squamous cell lung cancer, right  Patient presented  months ago with symptoms of:   Biopsies of (if applicable) [id number]:4/0/3474   SURGICAL PATHOLOGY  CASE: [id number]  PATIENT: Jared Schmidt  Surgical Pathology Report      Clinical History: RUL nodules (cm)      FINAL MICROSCOPIC DIAGNOSIS:   A. LUNG, RIGHT UPPER LOBE, BIOPSY:  - Squamous cell carcinoma.   B. LUNG, RIGHT UPPER LOBE ANTERIOR, BIOPSY:  - Squamous cell carcinoma.   COMMENT:   A. and B. There is likely sufficient material for ancillary testing upon  request. Dr. Saralyn Pilar has reviewed the case.    GROSS DESCRIPTION:   Specimen A: Received fresh is a 0.5 x 0.5 x 0.1 cm aggregate of tan-pink  to dark red soft tissue/material, entirely submitted in 1 block.   Specimen B: Received fresh is a 0.6 x 0.6 x 0.1 cm aggregate of  pink-gray to dark red soft tissue/material, entirely submitted in 1  block.      EXAM: CHEST - 2 VIEW   COMPARISON:  Mar 07, 2021   FINDINGS: The patient has known right perihilar lung cancer is stable on today's imaging. No pneumothorax. The heart, hila, and mediastinum are unchanged. No pulmonary nodules or masses otherwise seen. No new infiltrate.   IMPRESSION: 1. The patient's known right-sided lung cancer is stable based on this chest x-ray. 2. No cause for left-sided pain identified. Specifically, no pneumothorax or infiltrate.    Tobacco/Marijuana/Snuff/ETOH use:   Past/Anticipated interventions by cardiothoracic surgery, if any:   BIOPSY   06/24/2018    Procedure: BIOPSY;  Surgeon: Daneil Dolin, MD;  [facility];  Service: Endoscopy;;  gastric     COLONOSCOPY N/A 06/24/2018    Dr. Gala Romney: Internal hemorrhoids, diverticulosis, 9 mm polyp removed from the sigmoid colon which was hyperplastic.   ESOPHAGOGASTRODUODENOSCOPY N/A 06/24/2018    Dr. Gala Romney: Multiple erosions in the stomach and duodenal bulb, gastric  biopsies with chronic gastritis with intestinal metaplasia, no H. pylori.   ESOPHAGOGASTRODUODENOSCOPY (EGD) WITH PROPOFOL N/A 09/17/2018    Procedure: ESOPHAGOGASTRODUODENOSCOPY (EGD) WITH PROPOFOL;  Surgeon: Daneil Dolin, MD;  [facility];  Service: Endoscopy;  Laterality: N/A;  11:00am   GIVENS CAPSULE STUDY N/A 09/15/2018    Procedure: GIVENS CAPSULE STUDY;  Surgeon: Daneil Dolin, MD;  [facility];  Service: Endoscopy;  Laterality: N/A;  7:30am   none       POLYPECTOMY   06/24/2018    Procedure: POLYPECTOMY;  Surgeon: Daneil Dolin, MD;  [facility];  Service: Endoscopy;;  sigmoid polyp hs   VIDEO BRONCHOSCOPY WITH ENDOBRONCHIAL NAVIGATION N/A 03/09/2021    Procedure: VIDEO BRONCHOSCOPY WITH ENDOBRONCHIAL NAVIGATION;  Surgeon: Melrose Nakayama, MD;  Location: [place];  Service: Thoracic;  Laterality: N/A;   VIDEO BRONCHOSCOPY WITH ENDOBRONCHIAL ULTRASOUND N/A 03/09/2021    Procedure: VIDEO BRONCHOSCOPY WITH ENDOBRONCHIAL ULTRASOUND;  Surgeon: Melrose Nakayama, MD;  Location: [place];  Service: Thoracic;  Laterality: N/A;   Past/Anticipated interventions by medical oncology, if any:  He is not a surgical candidate based on his severely low diffusion capacity.will make a referral to [facility] radiation oncology for SBRT of the lung lesions. - We will send tumor tissue for PD-L1 and other mutations. - RTC 6 weeks for follow-up.  Signs/Symptoms Weight changes, if any: Has lost 12 pounds since March 12 Respiratory complaints, if any: Difficulty with breathing with activity. Hemoptysis,  if any: NOne Pain issues, if any:  In chest on left side  SAFETY ISSUES: Prior radiation? no Pacemaker/ICD? no Possible current pregnancy?no Is the patient on methotrexate? no  Current Complaints / other details:   Vitals:   05/01/21 1401  BP: (!) 139/59  Pulse: 74  Resp: 20  SpO2: 100%  Weight: 60.5 kg  Height: _0  (1.702 m)

## 2021-05-01 ENCOUNTER — Encounter: Payer: Self-pay | Admitting: Radiation Oncology

## 2021-05-01 ENCOUNTER — Ambulatory Visit
Admission: RE | Admit: 2021-05-01 | Discharge: 2021-05-01 | Disposition: A | Discharge status: Home / Self Care | Payer: PPO | Source: Ambulatory Visit | Attending: Radiation Oncology | Admitting: Radiation Oncology

## 2021-05-01 ENCOUNTER — Other Ambulatory Visit: Payer: Self-pay

## 2021-05-01 VITALS — BP 139/59 | HR 74 | Resp 20 | Ht 67.0 in | Wt 133.4 lb

## 2021-05-01 DIAGNOSIS — K219 Gastro-esophageal reflux disease without esophagitis: Secondary | ICD-10-CM | POA: Diagnosis not present

## 2021-05-01 DIAGNOSIS — Z7982 Long term (current) use of aspirin: Secondary | ICD-10-CM | POA: Diagnosis not present

## 2021-05-01 DIAGNOSIS — D500 Iron deficiency anemia secondary to blood loss (chronic): Secondary | ICD-10-CM | POA: Insufficient documentation

## 2021-05-01 DIAGNOSIS — C3411 Malignant neoplasm of upper lobe, right bronchus or lung: Secondary | ICD-10-CM | POA: Insufficient documentation

## 2021-05-01 DIAGNOSIS — C3491 Malignant neoplasm of unspecified part of right bronchus or lung: Secondary | ICD-10-CM

## 2021-05-01 DIAGNOSIS — E7800 Pure hypercholesterolemia, unspecified: Secondary | ICD-10-CM | POA: Diagnosis not present

## 2021-05-01 DIAGNOSIS — J449 Chronic obstructive pulmonary disease, unspecified: Secondary | ICD-10-CM | POA: Diagnosis not present

## 2021-05-01 DIAGNOSIS — M129 Arthropathy, unspecified: Secondary | ICD-10-CM | POA: Diagnosis not present

## 2021-05-01 DIAGNOSIS — Z87891 Personal history of nicotine dependence: Secondary | ICD-10-CM | POA: Diagnosis not present

## 2021-05-01 DIAGNOSIS — Z8673 Personal history of transient ischemic attack (TIA), and cerebral infarction without residual deficits: Secondary | ICD-10-CM | POA: Insufficient documentation

## 2021-05-01 DIAGNOSIS — Z79899 Other long term (current) drug therapy: Secondary | ICD-10-CM | POA: Insufficient documentation

## 2021-05-01 DIAGNOSIS — E539 Vitamin B deficiency, unspecified: Secondary | ICD-10-CM | POA: Insufficient documentation

## 2021-05-01 NOTE — Progress Notes (Signed)
  Radiation Oncology         [phone] ________________________________  Name: COLTYN HANNING MRN: [id number]  Date: 05/01/2021  DOB: Jun 13, 1936  STEREOTACTIC BODY RADIOTHERAPY SIMULATION AND TREATMENT PLANNING NOTE    ICD-10-CM   1. Squamous cell lung cancer, right (HCC)  C34.91       DIAGNOSIS:  85 yo man with two sites of squamous cell carcinomas of the right upper lung Stage IIB (cT3, cN0, cM0)  NARRATIVE:  The patient was brought to the [place].  Identity was confirmed.  All relevant records and images related to the planned course of therapy were reviewed.  The patient freely provided informed written consent to proceed with treatment after reviewing the details related to the planned course of therapy. The consent form was witnessed and verified by the simulation staff.  Then, the patient was set-up in a stable reproducible  supine position for radiation therapy.  A BodyFix immobilization pillow was fabricated for reproducible positioning.  Then I personally applied the abdominal compression paddle to limit respiratory excursion.  4D respiratoy motion management CT images were obtained.  Surface markings were placed.  The CT images were loaded into the planning software.  Then, using Cine, MIP, and standard views, the internal target volume (ITV) and planning target volumes (PTV) were delinieated, and avoidance structures were contoured.  Treatment planning then occurred.  The radiation prescription was entered and confirmed.  A total of two complex treatment devices were fabricated in the form of the BodyFix immobilization pillow and a neck accuform cushion.  I have requested : 3D Simulation  I have requested a DVH of the following structures: Heart, Lungs, Esophagus, Chest Wall, Brachial Plexus, Major Blood Vessels, and targets.  SPECIAL TREATMENT PROCEDURE:  The planned course of therapy using radiation constitutes a special treatment procedure. Special care is  required in the management of this patient for the following reasons. This treatment constitutes a Special Treatment Procedure for the following reason: [ High dose per fraction requiring special monitoring for increased toxicities of treatment including daily imaging..  The special nature of the planned course of radiotherapy will require increased physician supervision and oversight to ensure patient's safety with optimal treatment outcomes.  RESPIRATORY MOTION MANAGEMENT SIMULATION:  In order to account for effect of respiratory motion on target structures and other organs in the planning and delivery of radiotherapy, this patient underwent respiratory motion management simulation.  To accomplish this, when the patient was brought to the CT simulation planning suite, 4D respiratoy motion management CT images were obtained.  The CT images were loaded into the planning software.  Then, using a variety of tools including Cine, MIP, and standard views, the target volume and planning target volumes (PTV) were delineated.  Avoidance structures were contoured.  Treatment planning then occurred.  Dose volume histograms were generated and reviewed for each of the requested structure.  The resulting plan was carefully reviewed and approved today.  PLAN:  The patient will receive 54 Gy in 3 fractions.  ________________________________  Sheral Apley Tammi Klippel, M.D.

## 2021-05-01 NOTE — Progress Notes (Signed)
Radiation Oncology         [phone]) [phone] ________________________________  Initial outpatient Consultation  Name: Jared Schmidt MRN: [id number]  Date of Service: 05/01/2021 DOB: 1936/02/04  YI:FOYD, Trinna Balloon, NP  Derek Jack, MD   REFERRING PHYSICIAN: Derek Jack, MD  DIAGNOSIS: 85 year old male with two synchronous, Clinical Stage I, NSCLC, squamous carcinoma involving in the right upper lobe.    ICD-10-CM   1. Squamous cell lung cancer, right (HCC)  C34.91       HISTORY OF PRESENT ILLNESS: Jared Schmidt is a 85 y.o. male seen at the request of Dr. Delton Coombes, whom he has previously been seen for a history of iron deficiency anemia in 2019.  More recently, he presented to the ED at [facility] on 01/13/21 with complaints of new onset hemoptysis and cough. He underwent angio chest CT at that time showing heterogeneous noncalcified RUL lung masses measuring 2.8 cm and 3.1 cm, concerning for underlying neoplastic process. He was referred back to Dr. Delton Coombes on 01/18/21, who recommended staging studies with brain MRI and PET scan. Brain MRI was performed that day and showed a punctate focus of enhancement in the left precentral gyrus favored artifactual and multiple remote infarcts but no parenchymal brain disease.  PET scan was performed on 01/25/21 showing both RUL lung nodules to be hypermetabolic as well as a small mildly hypermetabolic right hilar lymph node, favored likely to be involved. There was also faintly accentuated metabolic activity of a right paratracheal lymph node but no findings of extrathoracic metastatic disease.  He was referred to Dr. Roxan Hockey on 02/20/21 for consideration of surgical resection. Given the patient's significant COPD and severely reduced diffusion capacity, he was not felt to be a candidate for lobectomy. He initially declined any further work up but ultimately opted to proceed with bronchoscopy and EBUS on 03/09/21 for tissue  confirmation on biopsy. Final surgical pathology confirmed the presence of non-small cell carcinoma, squamous cell carcinoma in the samples taken from both of the RUL nodules. FNA performed on three different lymph nodes (station 7, 4R and 11R) were all negative for malignant cells.  In light of this new information, he has been kindly referred to [place] today for consideration of SBRT treatment to the synchronous, early stage lung cancer in the 2 nodules in the RUL.  PREVIOUS RADIATION THERAPY: No  PAST MEDICAL HISTORY:  Past Medical History:  Diagnosis Date   Anemia    Arthritis    GERD (gastroesophageal reflux disease)    Hypercholesteremia    Hypertension    Iron deficiency anemia due to chronic blood loss 03/17/2018   Iron deficiency anemia due to chronic blood loss 03/17/2018   Stroke ([place])    TIA 2020   Symptomatic anemia 01/20/2018   Vitamin B deficiency    patient denies      PAST SURGICAL HISTORY: Past Surgical History:  Procedure Laterality Date   BIOPSY  06/24/2018   Procedure: BIOPSY;  Surgeon: Daneil Dolin, MD;  [facility];  Service: Endoscopy;;  gastric    COLONOSCOPY N/A 06/24/2018   Dr. Gala Romney: Internal hemorrhoids, diverticulosis, 9 mm polyp removed from the sigmoid colon which was hyperplastic.   ESOPHAGOGASTRODUODENOSCOPY N/A 06/24/2018   Dr. Gala Romney: Multiple erosions in the stomach and duodenal bulb, gastric biopsies with chronic gastritis with intestinal metaplasia, no H. pylori.   ESOPHAGOGASTRODUODENOSCOPY (EGD) WITH PROPOFOL N/A 09/17/2018   Procedure: ESOPHAGOGASTRODUODENOSCOPY (EGD) WITH PROPOFOL;  Surgeon: Daneil Dolin, MD;  [facility]  [facility];  Service: Endoscopy;  Laterality: N/A;  11:00am   GIVENS CAPSULE STUDY N/A 09/15/2018   Procedure: GIVENS CAPSULE STUDY;  Surgeon: Daneil Dolin, MD;  [facility];  Service: Endoscopy;  Laterality: N/A;  7:30am   none     POLYPECTOMY  06/24/2018   Procedure: POLYPECTOMY;  Surgeon:  Daneil Dolin, MD;  [facility];  Service: Endoscopy;;  sigmoid polyp hs   VIDEO BRONCHOSCOPY WITH ENDOBRONCHIAL NAVIGATION N/A 03/09/2021   Procedure: VIDEO BRONCHOSCOPY WITH ENDOBRONCHIAL NAVIGATION;  Surgeon: Melrose Nakayama, MD;  Location: [place];  Service: Thoracic;  Laterality: N/A;   VIDEO BRONCHOSCOPY WITH ENDOBRONCHIAL ULTRASOUND N/A 03/09/2021   Procedure: VIDEO BRONCHOSCOPY WITH ENDOBRONCHIAL ULTRASOUND;  Surgeon: Melrose Nakayama, MD;  Location: [place];  Service: Thoracic;  Laterality: N/A;    FAMILY HISTORY:  Family History  Problem Relation Age of Onset   Colon cancer Neg Hx     SOCIAL HISTORY:  Social History   Socioeconomic History   Marital status: Married    Spouse name: Earnie Bechard   Number of children: Not on file   Years of education: Not on file   Highest education level: 8th grade  Occupational History   Occupation: retired  Tobacco Use   Smoking status: Former    Packs/day: 0.50    Years: 67.00    Pack years: 33.50    Types: Cigarettes    Quit date: 12/11/2017    Years since quitting: 3.3   Smokeless tobacco: Never  Vaping Use   Vaping Use: Never used  Substance and Sexual Activity   Alcohol use: No   Drug use: No   Sexual activity: Not Currently  Other Topics Concern   Not on file  Social History Narrative   Lives with wife [place] in [place]   Dog named poppet      Enjoys hanging around [employer]: eats all food groups   Caffeine: coffee   Water: 4-5 cups daily      Wears seat belt    Does not use phone while driving    Oceanographer at home    Social Determinants of Health   Financial Resource Strain: Low Risk    Difficulty of Paying Living Expenses: Not hard at all  Food Insecurity: No Food Insecurity   Worried About Charity fundraiser in the Last Year: Never true   Arboriculturist in the Last Year: Never true  Transportation Needs: No Transportation Needs   Lack of Transportation (Medical): No   Lack  of Transportation (Non-Medical): No  Physical Activity: Inactive   Days of Exercise per Week: 0 days   Minutes of Exercise per Session: 0 min  Stress: No Stress Concern Present   Feeling of Stress : Only a little  Social Connections: Socially Isolated   Frequency of Communication with Friends and Family: Once a week   Frequency of Social Gatherings with Friends and Family: Never   Attends Religious Services: Never   Marine scientist or Organizations: No   Attends Music therapist: Never   Marital Status: Married  Human resources officer Violence: Not At Risk   Fear of Current or Ex-Partner: No   Emotionally Abused: No   Physically Abused: No   Sexually Abused: No    ALLERGIES: Patient has no known allergies.  MEDICATIONS:  Current Outpatient Medications  Medication Sig Dispense Refill   acetaminophen (TYLENOL)  500 MG tablet Take 500 mg by mouth every 6 (six) hours as needed for moderate pain or mild pain.     amLODipine (NORVASC) 5 MG tablet Take 1 tablet (5 mg total) by mouth daily. 30 tablet 5   aspirin EC 81 MG tablet Take 81 mg by mouth daily. Swallow whole.     atorvastatin (LIPITOR) 10 MG tablet Take 1 tablet (10 mg total) by mouth daily with supper. 90 tablet 3   carvedilol (COREG) 12.5 MG tablet TAKE 1 TABLET BY MOUTH TWICE DAILY with a meal 30 tablet 3   Multiple Vitamins-Minerals (MULTIVITAMIN WITH MINERALS) tablet Take 1 tablet by mouth daily.     pantoprazole (PROTONIX) 40 MG tablet Take 1 tablet (40 mg total) by mouth daily. 30 tablet 3   vitamin C (ASCORBIC ACID) 500 MG tablet Take 500 mg by mouth daily.     vitamin E 100 UNIT capsule Take 100 Units by mouth daily.      No current facility-administered medications for this encounter.    REVIEW OF SYSTEMS:  On review of systems, the patient reports that he is doing well overall. He denies any increased shortness of breath but admits to chronic shortness of breath with exertion.  He denies cough, fevers,  chills, or night sweats. He reports pain to his left chest wall that is improved with lying flat and is not associated with deep breathing. He denies any recent known injury to the chest wall but feels like it is deep. He had a recent CXR for further evaluation on 04/23/21 and this did not show any source of his pain.  He has had a weight loss of 12 lbs since his ED visit in March but his weight is stable more recently. He denies any bowel or bladder disturbances, and denies abdominal pain, nausea or vomiting. He denies any new musculoskeletal or joint aches or pains. A complete review of systems is obtained and is otherwise negative.    PHYSICAL EXAM:  Wt Readings from Last 3 Encounters:  05/01/21 133 lb 6 oz (60.5 kg)  03/29/21 135 lb 14.4 oz (61.6 kg)  03/20/21 136 lb 4.8 oz (61.8 kg)   Temp Readings from Last 3 Encounters:  03/29/21 (!) 96.8 F (36 C) (Temporal)  03/20/21 97.7 F (36.5 C) (Oral)  03/09/21 (!) 97 F (36.1 C)   BP Readings from Last 3 Encounters:  05/01/21 (!) 139/59  03/29/21 (!) 144/82  03/20/21 135/66   Pulse Readings from Last 3 Encounters:  05/01/21 74  03/29/21 81  03/20/21 82   Pain Assessment Pain Score: 5  Pain Loc: Chest/10  In general this is a well appearing African American male in no acute distress. He's alert and oriented x4 and appropriate throughout the examination. Cardiopulmonary assessment is negative for acute distress and he exhibits normal effort.  He is non tender to palpation in the left chest wall and there are no palpable abnormalities.   KPS = 70  100 - Normal; no complaints; no evidence of disease. 90   - Able to carry on normal activity; minor signs or symptoms of disease. 80   - Normal activity with effort; some signs or symptoms of disease. 40   - Cares for self; unable to carry on normal activity or to do active work. 60   - Requires occasional assistance, but is able to care for most of his personal needs. 50   - Requires  considerable assistance and frequent medical care. 40   -  Disabled; requires special care and assistance. 52   - Severely disabled; hospital admission is indicated although death not imminent. 41   - Very sick; hospital admission necessary; active supportive treatment necessary. 10   - Moribund; fatal processes progressing rapidly. 0     - Dead  Karnofsky DA, Abelmann [place], Craver LS and Burchenal JH [phone]) The use of the nitrogen mustards in the palliative treatment of carcinoma: with particular reference to bronchogenic carcinoma Cancer 1 634-56  LABORATORY DATA:  Lab Results  Component Value Date   WBC 6.7 03/07/2021   HGB 10.1 (L) 03/07/2021   HCT 35.3 (L) 03/07/2021   MCV 85.9 03/07/2021   PLT 383 03/07/2021   Lab Results  Component Value Date   NA 138 03/07/2021   K 4.4 03/07/2021   CL 105 03/07/2021   CO2 26 03/07/2021   Lab Results  Component Value Date   ALT 12 03/07/2021   AST 26 03/07/2021   ALKPHOS 90 03/07/2021   BILITOT 0.4 03/07/2021     RADIOGRAPHY: DG Chest 2 View  Result Date: 04/24/2021 CLINICAL DATA:  Known right lung cancer.  Left-sided pain. EXAM: CHEST - 2 VIEW COMPARISON:  Mar 07, 2021 FINDINGS: The patient has known right perihilar lung cancer is stable on today's imaging. No pneumothorax. The heart, hila, and mediastinum are unchanged. No pulmonary nodules or masses otherwise seen. No new infiltrate. IMPRESSION: 1. The patient's known right-sided lung cancer is stable based on this chest x-ray. 2. No cause for left-sided pain identified. Specifically, no pneumothorax or infiltrate. Electronically Signed   By: Dorise Bullion III M.D   On: 04/24/2021 17:22      IMPRESSION/PLAN: 57. 85 y.o. man with synchronous, stage I, NSCLC, squamous carcinoma involving 2 nodules in the right upper lobe.  Today, we talked to the patient and his wife about the findings and workup thus far. We discussed the natural history of squamous cell carcinoma and the fact that it is  impossible to distinguish whether the nodules in the right upper lobe are from the same cancer or two separate, early stage cancers but that based on the information we have at present, we would give him the benefit of the doubt and treat these as synchronous, Stage I, squamous cell carcinomas.  We discussed the general treatment of early stage NSCLC, highlighting the role of stereotactic radiotherapy in the management. We discussed the available radiation techniques, and focused on the details and logistics of delivery. The recommendation is for a 3 fraction course of stereotactic radiotherapy (SBRT) targeting both of the lesions in the RUL. We reviewed the anticipated acute and late sequelae associated with radiation in this setting. The patient was encouraged to ask questions that were answered to his stated satisfaction.  At the end of our conversation, the patient is in agreement to proceed with the recommended 3 fraction course of SBRT to the 2 lesions in the RUL.  He has freely signed written consent to proceed today in the office and a copy of this document has been placed in his medical record. He will proceed with CT SIM/treatment planning following our visit today in anticipation of beginning his treatments in the near future. We enjoyed meeting him and his wife and look forward to continuing to participate in his care.  We personally spent 75 minutes in this encounter including chart review, reviewing radiological studies, meeting face-to-face with the patient, entering orders and completing documentation.    Nicholos Johns, PA-C  Tyler Pita, MD  [facility]  Radiation Oncology Direct Dial: [phone]  Fax: [phone] [email or web address]  Skype  [email or web address]   This document serves as a record of services personally performed by Tyler Pita, MD and Freeman Caldron, PA-C. It was created on their behalf by Wilburn Mylar, a trained medical scribe. The creation of this record  is based on the scribe's personal observations and the provider's statements to them. This document has been checked and approved by the attending provider.

## 2021-05-09 ENCOUNTER — Inpatient Hospital Stay (HOSPITAL_COMMUNITY): Payer: PPO | Attending: Hematology

## 2021-05-09 ENCOUNTER — Encounter (HOSPITAL_COMMUNITY): Payer: Self-pay

## 2021-05-09 ENCOUNTER — Inpatient Hospital Stay (HOSPITAL_COMMUNITY)
Admission: EM | Admit: 2021-05-09 | Discharge: 2021-05-11 | DRG: 378 | Disposition: A | Discharge status: Home / Self Care | Payer: PPO | Attending: Internal Medicine | Admitting: Internal Medicine

## 2021-05-09 ENCOUNTER — Other Ambulatory Visit: Payer: Self-pay

## 2021-05-09 DIAGNOSIS — I10 Essential (primary) hypertension: Secondary | ICD-10-CM | POA: Diagnosis not present

## 2021-05-09 DIAGNOSIS — M1990 Unspecified osteoarthritis, unspecified site: Secondary | ICD-10-CM | POA: Diagnosis not present

## 2021-05-09 DIAGNOSIS — K31819 Angiodysplasia of stomach and duodenum without bleeding: Secondary | ICD-10-CM | POA: Diagnosis not present

## 2021-05-09 DIAGNOSIS — C3491 Malignant neoplasm of unspecified part of right bronchus or lung: Secondary | ICD-10-CM

## 2021-05-09 DIAGNOSIS — N1832 Chronic kidney disease, stage 3b: Secondary | ICD-10-CM

## 2021-05-09 DIAGNOSIS — D62 Acute posthemorrhagic anemia: Secondary | ICD-10-CM | POA: Diagnosis not present

## 2021-05-09 DIAGNOSIS — Z79899 Other long term (current) drug therapy: Secondary | ICD-10-CM

## 2021-05-09 DIAGNOSIS — R918 Other nonspecific abnormal finding of lung field: Secondary | ICD-10-CM

## 2021-05-09 DIAGNOSIS — K922 Gastrointestinal hemorrhage, unspecified: Secondary | ICD-10-CM | POA: Diagnosis present

## 2021-05-09 DIAGNOSIS — Z7982 Long term (current) use of aspirin: Secondary | ICD-10-CM | POA: Diagnosis not present

## 2021-05-09 DIAGNOSIS — E7800 Pure hypercholesterolemia, unspecified: Secondary | ICD-10-CM | POA: Diagnosis present

## 2021-05-09 DIAGNOSIS — Z20822 Contact with and (suspected) exposure to covid-19: Secondary | ICD-10-CM | POA: Diagnosis present

## 2021-05-09 DIAGNOSIS — D500 Iron deficiency anemia secondary to blood loss (chronic): Secondary | ICD-10-CM | POA: Diagnosis present

## 2021-05-09 DIAGNOSIS — E539 Vitamin B deficiency, unspecified: Secondary | ICD-10-CM | POA: Insufficient documentation

## 2021-05-09 DIAGNOSIS — D649 Anemia, unspecified: Secondary | ICD-10-CM

## 2021-05-09 DIAGNOSIS — K921 Melena: Secondary | ICD-10-CM | POA: Diagnosis not present

## 2021-05-09 DIAGNOSIS — Z87891 Personal history of nicotine dependence: Secondary | ICD-10-CM | POA: Diagnosis not present

## 2021-05-09 DIAGNOSIS — N1831 Chronic kidney disease, stage 3a: Secondary | ICD-10-CM | POA: Diagnosis present

## 2021-05-09 DIAGNOSIS — K219 Gastro-esophageal reflux disease without esophagitis: Secondary | ICD-10-CM | POA: Diagnosis not present

## 2021-05-09 DIAGNOSIS — K31811 Angiodysplasia of stomach and duodenum with bleeding: Secondary | ICD-10-CM | POA: Diagnosis not present

## 2021-05-09 DIAGNOSIS — Z8673 Personal history of transient ischemic attack (TIA), and cerebral infarction without residual deficits: Secondary | ICD-10-CM | POA: Insufficient documentation

## 2021-05-09 DIAGNOSIS — I129 Hypertensive chronic kidney disease with stage 1 through stage 4 chronic kidney disease, or unspecified chronic kidney disease: Secondary | ICD-10-CM | POA: Diagnosis present

## 2021-05-09 DIAGNOSIS — D631 Anemia in chronic kidney disease: Secondary | ICD-10-CM | POA: Diagnosis present

## 2021-05-09 DIAGNOSIS — K317 Polyp of stomach and duodenum: Secondary | ICD-10-CM | POA: Diagnosis present

## 2021-05-09 DIAGNOSIS — C3411 Malignant neoplasm of upper lobe, right bronchus or lung: Secondary | ICD-10-CM | POA: Insufficient documentation

## 2021-05-09 LAB — COMPREHENSIVE METABOLIC PANEL
ALT: 11 U/L (ref 0–44)
ALT: 12 U/L (ref 0–44)
AST: 19 U/L (ref 15–41)
AST: 22 U/L (ref 15–41)
Albumin: 3.3 g/dL — ABNORMAL LOW (ref 3.5–5.0)
Albumin: 3.2 g/dL — ABNORMAL LOW (ref 3.5–5.0)
Alkaline Phosphatase: 80 U/L (ref 38–126)
Alkaline Phosphatase: 76 U/L (ref 38–126)
Anion gap: 8 (ref 5–15)
Anion gap: 6 (ref 5–15)
BUN: 23 mg/dL (ref 8–23)
BUN: 23 mg/dL (ref 8–23)
CO2: 22 mmol/L (ref 22–32)
CO2: 23 mmol/L (ref 22–32)
Calcium: 9.2 mg/dL (ref 8.9–10.3)
Calcium: 8.7 mg/dL — ABNORMAL LOW (ref 8.9–10.3)
Chloride: 107 mmol/L (ref 98–111)
Chloride: 106 mmol/L (ref 98–111)
Creatinine, Ser: 1.25 mg/dL — ABNORMAL HIGH (ref 0.61–1.24)
Creatinine, Ser: 1.15 mg/dL (ref 0.61–1.24)
GFR, Estimated: 57 mL/min — ABNORMAL LOW (ref >60)
GFR, Estimated: >60 mL/min (ref >60)
Glucose, Bld: 125 mg/dL — ABNORMAL HIGH (ref 70–99)
Glucose, Bld: 116 mg/dL — ABNORMAL HIGH (ref 70–99)
Potassium: 4.2 mmol/L (ref 3.5–5.1)
Potassium: 4.5 mmol/L (ref 3.5–5.1)
Sodium: 137 mmol/L (ref 135–145)
Sodium: 135 mmol/L (ref 135–145)
Total Bilirubin: 0.5 mg/dL (ref 0.3–1.2)
Total Bilirubin: 0.4 mg/dL (ref 0.3–1.2)
Total Protein: 6.3 g/dL — ABNORMAL LOW (ref 6.5–8.1)
Total Protein: 6.2 g/dL — ABNORMAL LOW (ref 6.5–8.1)

## 2021-05-09 LAB — CBC WITH DIFFERENTIAL/PLATELET
Abs Immature Granulocytes: 0.02 10*3/uL (ref 0.00–0.07)
Basophils Absolute: 0 10*3/uL (ref 0.0–0.1)
Basophils Relative: 0 %
Eosinophils Absolute: 0.1 10*3/uL (ref 0.0–0.5)
Eosinophils Relative: 1 %
HCT: 20.3 % — ABNORMAL LOW (ref 39.0–52.0)
Hemoglobin: 5.2 g/dL — CL (ref 13.0–17.0)
Immature Granulocytes: 0 %
Lymphocytes Relative: 8 %
Lymphs Abs: 0.6 10*3/uL — ABNORMAL LOW (ref 0.7–4.0)
MCH: 17.4 pg — ABNORMAL LOW (ref 26.0–34.0)
MCHC: 25.6 g/dL — ABNORMAL LOW (ref 30.0–36.0)
MCV: 67.9 fL — ABNORMAL LOW (ref 80.0–100.0)
Monocytes Absolute: 1.3 10*3/uL — ABNORMAL HIGH (ref 0.1–1.0)
Monocytes Relative: 18 %
Neutro Abs: 5.2 10*3/uL (ref 1.7–7.7)
Neutrophils Relative %: 73 %
Platelets: 318 10*3/uL (ref 150–400)
RBC: 2.99 MIL/uL — ABNORMAL LOW (ref 4.22–5.81)
RDW: 25 % — ABNORMAL HIGH (ref 11.5–15.5)
WBC: 7.2 10*3/uL (ref 4.0–10.5)
nRBC: 0.3 % — ABNORMAL HIGH (ref 0.0–0.2)

## 2021-05-09 LAB — PREPARE RBC (CROSSMATCH)

## 2021-05-09 LAB — FERRITIN: Ferritin: 11 ng/mL — ABNORMAL LOW (ref 24–336)

## 2021-05-09 LAB — CBC
HCT: 19.7 % — ABNORMAL LOW (ref 39.0–52.0)
Hemoglobin: 4.9 g/dL — CL (ref 13.0–17.0)
MCH: 16.8 pg — ABNORMAL LOW (ref 26.0–34.0)
MCHC: 24.9 g/dL — ABNORMAL LOW (ref 30.0–36.0)
MCV: 67.7 fL — ABNORMAL LOW (ref 80.0–100.0)
Platelets: 299 10*3/uL (ref 150–400)
RBC: 2.91 MIL/uL — ABNORMAL LOW (ref 4.22–5.81)
RDW: 24.3 % — ABNORMAL HIGH (ref 11.5–15.5)
WBC: 6.5 10*3/uL (ref 4.0–10.5)
nRBC: 0.3 % — ABNORMAL HIGH (ref 0.0–0.2)

## 2021-05-09 LAB — IRON AND TIBC
Iron: 10 ug/dL — ABNORMAL LOW (ref 45–182)
Saturation Ratios: 3 % — ABNORMAL LOW (ref 17.9–39.5)
TIBC: 373 ug/dL (ref 250–450)
UIBC: 363 ug/dL

## 2021-05-09 LAB — POC OCCULT BLOOD, ED: Fecal Occult Bld: POSITIVE — AB

## 2021-05-09 LAB — RESP PANEL BY RT-PCR (FLU A&B, COVID) ARPGX2
Influenza A by PCR: NEGATIVE
Influenza B by PCR: NEGATIVE
SARS Coronavirus 2 by RT PCR: NEGATIVE

## 2021-05-09 LAB — MRSA NEXT GEN BY PCR, NASAL: MRSA by PCR Next Gen: NOT DETECTED

## 2021-05-09 MED ORDER — SODIUM CHLORIDE 0.9% IV SOLUTION: Freq: Once | INTRAVENOUS | Status: AC

## 2021-05-09 MED ORDER — ONDANSETRON HCL 4 MG PO TABS: 4.0000 mg | ORAL_TABLET | Freq: Four times a day (QID) | ORAL | Status: DC | PRN

## 2021-05-09 MED ORDER — PANTOPRAZOLE SODIUM 40 MG IV SOLR
40.0000 mg | INTRAVENOUS | Status: AC
  Administered 2021-05-09: 40 mg via INTRAVENOUS
  Filled 2021-05-09: qty 40

## 2021-05-09 MED ORDER — ACETAMINOPHEN 325 MG PO TABS: 650.0000 mg | ORAL_TABLET | Freq: Four times a day (QID) | ORAL | Status: DC | PRN

## 2021-05-09 MED ORDER — ONDANSETRON HCL 4 MG/2ML IJ SOLN: 4.0000 mg | Freq: Four times a day (QID) | INTRAMUSCULAR | Status: DC | PRN

## 2021-05-09 MED ORDER — PANTOPRAZOLE SODIUM 40 MG IV SOLR
40.0000 mg | Freq: Two times a day (BID) | INTRAVENOUS | Status: DC
  Administered 2021-05-10 – 2021-05-11 (×3): 40 mg via INTRAVENOUS
  Filled 2021-05-09 (×4): qty 40

## 2021-05-09 MED ORDER — ACETAMINOPHEN 650 MG RE SUPP: 650.0000 mg | Freq: Four times a day (QID) | RECTAL | Status: DC | PRN

## 2021-05-09 MED ORDER — BOOST / RESOURCE BREEZE PO LIQD CUSTOM
1.0000 | Freq: Three times a day (TID) | ORAL | Status: DC
  Administered 2021-05-10: 1 via ORAL

## 2021-05-09 NOTE — ED Triage Notes (Signed)
Pt to er, pt states that he has missed some appointments for her iron infusions, states that he had blood drawn today and they called him to tell him his hemoglobin was low and to go to the er.

## 2021-05-09 NOTE — ED Provider Notes (Signed)
[facility] EMERGENCY DEPARTMENT Provider Note   CSN: [id number] Arrival date & time: 05/09/21  1635     History Chief Complaint  Patient presents with   Abnormal Lab    Jared Schmidt is a 85 y.o. male.   Abnormal Lab  This patient is an 85 year old male, he has a history of chronic anemia, he also has chronic kidney disease, he is currently on iron infusions but states that he is missed the last couple of infusions and when he went to get blood work done earlier today he was found to have a hemoglobin of 5.2.  He does report that in the last couple weeks he has had increasing weakness and fatigue, especially with exertion.  He has experienced some dark-colored stools which are abnormal for him and new.  Review of the medical record shows that in 2019 the patient had an upper endoscopy by Dr. Gala Romney during which time he was found to have some actively bleeding lesions in the stomach which were endoscopically fixed at that time.  He has not noted any nausea vomiting or epigastric pain and has not seen any bright red blood per rectum.  At this time he has no abdominal pain.  As an aside the patient was recently diagnosed with lung cancer and is scheduled to start radiation therapy within the week.  He was sent here today from the doctor's office for evaluation and possible transfusion  Past Medical History:  Diagnosis Date   Anemia    Arthritis    GERD (gastroesophageal reflux disease)    Hypercholesteremia    Hypertension    Iron deficiency anemia due to chronic blood loss 03/17/2018   Iron deficiency anemia due to chronic blood loss 03/17/2018   Stroke ([place])    TIA 2020   Symptomatic anemia 01/20/2018   Vitamin B deficiency    patient denies    Patient Active Problem List   Diagnosis Date Noted   UGI bleed 05/09/2021   Squamous cell lung cancer, right ([place]) 03/20/2021   Chronic kidney disease (CKD) stage G3a/A1, moderately decreased glomerular filtration rate (GFR) between  45-59 mL/min/1.73 square meter and albuminuria creatinine ratio less than 30 mg/g ([place]) 02/06/2021   Immunization due 02/06/2021   Benign prostatic hyperplasia with urinary obstruction 07/17/2020   Weak urinary stream 03/08/2020   Nocturia 03/08/2020   Vitamin D deficiency 01/20/2020   Iron deficiency anemia due to chronic blood loss 03/17/2018   Hypertension 01/20/2018   Hypercholesteremia 01/20/2018   GERD (gastroesophageal reflux disease) 01/20/2018   Vitamin B deficiency 01/20/2018    Past Surgical History:  Procedure Laterality Date   BIOPSY  06/24/2018   Procedure: BIOPSY;  Surgeon: Daneil Dolin, MD;  [facility];  Service: Endoscopy;;  gastric    COLONOSCOPY N/A 06/24/2018   Dr. Gala Romney: Internal hemorrhoids, diverticulosis, 9 mm polyp removed from the sigmoid colon which was hyperplastic.   ESOPHAGOGASTRODUODENOSCOPY N/A 06/24/2018   Dr. Gala Romney: Multiple erosions in the stomach and duodenal bulb, gastric biopsies with chronic gastritis with intestinal metaplasia, no H. pylori.   ESOPHAGOGASTRODUODENOSCOPY (EGD) WITH PROPOFOL N/A 09/17/2018   Procedure: ESOPHAGOGASTRODUODENOSCOPY (EGD) WITH PROPOFOL;  Surgeon: Daneil Dolin, MD;  [facility];  Service: Endoscopy;  Laterality: N/A;  11:00am   GIVENS CAPSULE STUDY N/A 09/15/2018   Procedure: GIVENS CAPSULE STUDY;  Surgeon: Daneil Dolin, MD;  [facility];  Service: Endoscopy;  Laterality: N/A;  7:30am   none  POLYPECTOMY  06/24/2018   Procedure: POLYPECTOMY;  Surgeon: Daneil Dolin, MD;  [facility];  Service: Endoscopy;;  sigmoid polyp hs   VIDEO BRONCHOSCOPY WITH ENDOBRONCHIAL NAVIGATION N/A 03/09/2021   Procedure: VIDEO BRONCHOSCOPY WITH ENDOBRONCHIAL NAVIGATION;  Surgeon: Melrose Nakayama, MD;  Location: [place];  Service: Thoracic;  Laterality: N/A;   VIDEO BRONCHOSCOPY WITH ENDOBRONCHIAL ULTRASOUND N/A 03/09/2021   Procedure: VIDEO BRONCHOSCOPY WITH ENDOBRONCHIAL  ULTRASOUND;  Surgeon: Melrose Nakayama, MD;  Location: [place];  Service: Thoracic;  Laterality: N/A;       Family History  Problem Relation Age of Onset   Colon cancer Neg Hx     Social History   Tobacco Use   Smoking status: Former    Packs/day: 0.50    Years: 67.00    Pack years: 33.50    Types: Cigarettes    Quit date: 12/11/2017    Years since quitting: 3.4   Smokeless tobacco: Never  Vaping Use   Vaping Use: Never used  Substance Use Topics   Alcohol use: No   Drug use: No    Home Medications Prior to Admission medications   Medication Sig Start Date End Date Taking? Authorizing Provider  acetaminophen (TYLENOL) 500 MG tablet Take 500 mg by mouth every 6 (six) hours as needed for moderate pain or mild pain.    [provider]  amLODipine (NORVASC) 5 MG tablet Take 1 tablet (5 mg total) by mouth daily. 02/06/21   Perlie Mayo, NP  aspirin EC 81 MG tablet Take 81 mg by mouth daily. Swallow whole.    [provider]  atorvastatin (LIPITOR) 10 MG tablet Take 1 tablet (10 mg total) by mouth daily with supper. 02/05/21   Perlie Mayo, NP  carvedilol (COREG) 12.5 MG tablet TAKE 1 TABLET BY MOUTH TWICE DAILY with a meal 04/10/21   Noreene Larsson, NP  Multiple Vitamins-Minerals (MULTIVITAMIN WITH MINERALS) tablet Take 1 tablet by mouth daily.    [provider]  pantoprazole (PROTONIX) 40 MG tablet Take 1 tablet (40 mg total) by mouth daily. 03/29/21   Fayrene Helper, MD  vitamin C (ASCORBIC ACID) 500 MG tablet Take 500 mg by mouth daily.    [provider]  vitamin E 100 UNIT capsule Take 100 Units by mouth daily.     [provider]    Allergies    Patient has no known allergies.  Review of Systems   Review of Systems  All other systems reviewed and are negative.  Physical Exam Updated Vital Signs BP (!) 151/86   Pulse 93   Temp 97.7 F (36.5 C) (Oral)   Resp (!) 21   Ht 1.702 m (5\' 7" )   Wt 62.6 kg   SpO2 98%    BMI 21.61 kg/m   Physical Exam Vitals and nursing note reviewed.  Constitutional:      General: He is not in acute distress.    Appearance: He is well-developed.  HENT:     Head: Normocephalic and atraumatic.     Mouth/Throat:     Pharynx: No oropharyngeal exudate.  Eyes:     General: No scleral icterus.       Right eye: No discharge.        Left eye: No discharge.     Pupils: Pupils are equal, round, and reactive to light.     Comments: Pale conjunctive a  Neck:     Thyroid: No thyromegaly.  Vascular: No JVD.  Cardiovascular:     Rate and Rhythm: Normal rate and regular rhythm.     Heart sounds: Normal heart sounds. No murmur heard.   No friction rub. No gallop.  Pulmonary:     Effort: Pulmonary effort is normal. No respiratory distress.     Breath sounds: Normal breath sounds. No wheezing or rales.  Abdominal:     General: Bowel sounds are normal. There is no distension.     Palpations: Abdomen is soft. There is no mass.     Tenderness: There is no abdominal tenderness.  Musculoskeletal:        General: No tenderness. Normal range of motion.     Cervical back: Normal range of motion and neck supple.     Right lower leg: No edema.     Left lower leg: No edema.  Lymphadenopathy:     Cervical: No cervical adenopathy.  Skin:    General: Skin is warm and dry.     Coloration: Skin is pale.     Findings: No erythema or rash.  Neurological:     Mental Status: He is alert.     Coordination: Coordination normal.  Psychiatric:        Behavior: Behavior normal.    ED Results / Procedures / Treatments   Labs (all labs ordered are listed, but only abnormal results are displayed) Labs Reviewed  CBC - Abnormal; Notable for the following components:      Result Value   RBC 2.91 (*)    Hemoglobin 4.9 (*)    HCT 19.7 (*)    MCV 67.7 (*)    MCH 16.8 (*)    MCHC 24.9 (*)    RDW 24.3 (*)    nRBC 0.3 (*)    All other components within normal limits  POC OCCULT BLOOD,  ED - Abnormal; Notable for the following components:   Fecal Occult Bld POSITIVE (*)    All other components within normal limits  SARS CORONAVIRUS 2 (TAT 6-24 HRS)  OCCULT BLOOD X 1 CARD TO LAB, STOOL  TYPE AND SCREEN  PREPARE RBC (CROSSMATCH)    EKG EKG Interpretation  Date/Time:  Wednesday May 09 2021 18:44:08 EDT Ventricular Rate:  84 PR Interval:  163 QRS Duration: 96 QT Interval:  383 QTC Calculation: 453 R Axis:   73 Text Interpretation: Sinus rhythm Abnormal R-wave progression, early transition Nonspecific T abnrm, anterolateral leads Baseline wander in lead(s) V3 Confirmed by Noemi Chapel [phone]) on 05/09/2021 6:52:55 PM  Radiology No results found.  Procedures .Critical Care  Date/Time: 05/09/2021 6:52 PM Performed by: Noemi Chapel, MD Authorized by: Noemi Chapel, MD   Critical care provider statement:    Critical care time (minutes):  35   Critical care time was exclusive of:  Separately billable procedures and treating other patients and teaching time   Critical care was necessary to treat or prevent imminent or life-threatening deterioration of the following conditions: severe anemia with GI bleed.   Critical care was time spent personally by me on the following activities:  Blood draw for specimens, development of treatment plan with patient or surrogate, discussions with consultants, evaluation of patient's response to treatment, examination of patient, obtaining history from patient or surrogate, ordering and performing treatments and interventions, ordering and review of laboratory studies, ordering and review of radiographic studies, pulse oximetry, re-evaluation of patient's condition and review of old charts   Medications Ordered in ED Medications  0.9 %  sodium chloride infusion (  Manually program via Guardrails IV Fluids) (has no administration in time range)  pantoprazole (PROTONIX) injection 40 mg (40 mg Intravenous Given 05/09/21 1906)    ED Course  I  have reviewed the triage vital signs and the nursing notes.  Pertinent labs & imaging results that were available during my care of the patient were reviewed by me and considered in my medical decision making (see chart for details).    MDM Rules/Calculators/A&P                          Recheck CBC to make sure that hemoglobin is truly that low, the patient appears hemodynamically stable with a blood pressure of 145/68.  Heart rate is 83.  Abdomen is soft and nontender.  Hemoccult stool, anticipate admission for transfusion  Patient agreeable to the plan  Hgb positive stool - GI paged -  Transfusion ordered and started in ED Hgb severely low - CC provided  I discussed the care with Dr. Melony Overly of the GI service who will see the patient in consultation and recommends a clear liquid diet until breakfast tomorrow, n.p.o. after clear liquids for breakfast  Dr. Olevia Bowens to admit to Step Down unit  Final Clinical Impression(s) / ED Diagnoses Final diagnoses:  UGI bleed  Severe anemia     Noemi Chapel, MD 05/09/21 1916

## 2021-05-09 NOTE — H&P (Signed)
History and Physical    [name]:[id number] DOB: May 27, 1936 DOA: 05/09/2021  PCP: Noreene Larsson, NP  Patient coming from: Home.  I have personally briefly reviewed patient's old medical records in [place]  Chief Complaint: Abnormal blood work.  HPI: Jared Schmidt is a 85 y.o. male with medical history significant of microcytic anemia in the setting of chronic blood loss, osteoarthritis, GERD, hyperlipidemia, hypertension, history of TIA, history of bleeding PUD in 2019 who is coming to the emergency department after his most recent blood work showed a hemoglobin of 4.9 g/dL.  The patient stated that he has missed the last couple iron IV infusions.  He has noticed t several episodes of melena.  He has been weak, dyspneic and lightheaded at times.  However, he denied abdominal pain, nausea, emesis, diarrhea, constipation or hematochezia.  No dysuria, flank pain, frequency or hematuria.  Denied fever, chills, rhinorrhea, sore throat, wheezing, chest pain, palpitations, diaphoresis, PND, orthopnea or pitting edema of the lower extremities.  Denies polyuria, polydipsia, polyphagia or blurred vision.  ED Course: Initial vital signs were temperature 97.7 F, pulse 83, respiration 20, BP 145/68 mmHg O2 sat 99% on room air.  A 2 unit blood transfusion was started in the emergency department.  Dr. Laural Golden was contacted who asked for the patient to be on clear liquids and then nothing after breakfast.  Lab work: CBC showed a white count of 6.5, hemoglobin 4.9 g/dL platelets 299.  Fecal occult blood was positive.  CMP has been ordered.  Review of Systems: As per HPI otherwise all other systems reviewed and are negative.  Past Medical History:  Diagnosis Date   Anemia    Arthritis    GERD (gastroesophageal reflux disease)    Hypercholesteremia    Hypertension    Iron deficiency anemia due to chronic blood loss 03/17/2018   Iron deficiency anemia due to chronic blood loss 03/17/2018    Stroke ([place])    TIA 2020   Symptomatic anemia 01/20/2018   Vitamin B deficiency    patient denies   Past Surgical History:  Procedure Laterality Date   BIOPSY  06/24/2018   Procedure: BIOPSY;  Surgeon: Daneil Dolin, MD;  [facility];  Service: Endoscopy;;  gastric    COLONOSCOPY N/A 06/24/2018   Dr. Gala Romney: Internal hemorrhoids, diverticulosis, 9 mm polyp removed from the sigmoid colon which was hyperplastic.   ESOPHAGOGASTRODUODENOSCOPY N/A 06/24/2018   Dr. Gala Romney: Multiple erosions in the stomach and duodenal bulb, gastric biopsies with chronic gastritis with intestinal metaplasia, no H. pylori.   ESOPHAGOGASTRODUODENOSCOPY (EGD) WITH PROPOFOL N/A 09/17/2018   Procedure: ESOPHAGOGASTRODUODENOSCOPY (EGD) WITH PROPOFOL;  Surgeon: Daneil Dolin, MD;  [facility];  Service: Endoscopy;  Laterality: N/A;  11:00am   GIVENS CAPSULE STUDY N/A 09/15/2018   Procedure: GIVENS CAPSULE STUDY;  Surgeon: Daneil Dolin, MD;  [facility];  Service: Endoscopy;  Laterality: N/A;  7:30am   none     POLYPECTOMY  06/24/2018   Procedure: POLYPECTOMY;  Surgeon: Daneil Dolin, MD;  [facility];  Service: Endoscopy;;  sigmoid polyp hs   VIDEO BRONCHOSCOPY WITH ENDOBRONCHIAL NAVIGATION N/A 03/09/2021   Procedure: VIDEO BRONCHOSCOPY WITH ENDOBRONCHIAL NAVIGATION;  Surgeon: Melrose Nakayama, MD;  Location: [place];  Service: Thoracic;  Laterality: N/A;   VIDEO BRONCHOSCOPY WITH ENDOBRONCHIAL ULTRASOUND N/A 03/09/2021   Procedure: VIDEO BRONCHOSCOPY WITH ENDOBRONCHIAL ULTRASOUND;  Surgeon: Melrose Nakayama, MD;  Location: [place];  Service: Thoracic;  Laterality: N/A;   Social History  reports that he quit smoking about 3 years ago. His smoking use included cigarettes. He has a 33.50 pack-year smoking history. He has never used smokeless tobacco. He reports that he does not drink alcohol and does not use drugs.  No Known Allergies  Family History  Problem  Relation Age of Onset   Colon cancer Neg Hx    Prior to Admission medications   Medication Sig Start Date End Date Taking? Authorizing Provider  acetaminophen (TYLENOL) 500 MG tablet Take 500 mg by mouth every 6 (six) hours as needed for moderate pain or mild pain.    [provider]  amLODipine (NORVASC) 5 MG tablet Take 1 tablet (5 mg total) by mouth daily. 02/06/21   Perlie Mayo, NP  aspirin EC 81 MG tablet Take 81 mg by mouth daily. Swallow whole.    [provider]  atorvastatin (LIPITOR) 10 MG tablet Take 1 tablet (10 mg total) by mouth daily with supper. 02/05/21   Perlie Mayo, NP  carvedilol (COREG) 12.5 MG tablet TAKE 1 TABLET BY MOUTH TWICE DAILY with a meal 04/10/21   Noreene Larsson, NP  Multiple Vitamins-Minerals (MULTIVITAMIN WITH MINERALS) tablet Take 1 tablet by mouth daily.    [provider]  pantoprazole (PROTONIX) 40 MG tablet Take 1 tablet (40 mg total) by mouth daily. 03/29/21   Fayrene Helper, MD  vitamin C (ASCORBIC ACID) 500 MG tablet Take 500 mg by mouth daily.    [provider]  vitamin E 100 UNIT capsule Take 100 Units by mouth daily.     [provider]   Physical Exam: Vitals:   05/09/21 1728 05/09/21 1729 05/09/21 1900  BP: (!) 145/68  (!) 151/86  Pulse: 83  93  Resp: 20  (!) 21  Temp: 97.7 F (36.5 C)    TempSrc: Oral    SpO2: 99%  98%  Weight:  62.6 kg   Height:  5\' 7"  (1.702 m)    Constitutional: Looks chronically ill, but in NAD, calm, comfortable Eyes: PERRL, lids and conjunctivae are pale. ENMT: Mucous membranes are moist. Posterior pharynx clear of any exudate or lesions. Neck: Normal, supple, no masses, no thyromegaly Respiratory: Clear to auscultation bilaterally, no wheezing, no crackles. Normal respiratory effort. No accessory muscle use.  Cardiovascular: Regular rate and rhythm, no murmurs / rubs / gallops. No extremity edema. 2+ pedal pulses. No carotid bruits.  Abdomen: No distention.   Bowel sounds positive.  Soft, no tenderness, no masses palpated. No hepatosplenomegaly. Musculoskeletal: no clubbing / cyanosis. Good ROM, no contractures. Normal muscle tone.  Skin: Positive pallor, but no acute rashes, lesions, ulcers on very limited dermatological examination. Neurologic: CN 2-12 grossly intact. Sensation intact, DTR normal. Strength 5/5 in all 4.  Psychiatric: Normal judgment and insight. Alert and oriented x 3. Normal mood.   Labs on Admission: I have personally reviewed following labs and imaging studies  CBC: Recent Labs  Lab 05/09/21 1411 05/09/21 1755  WBC 7.2 6.5  NEUTROABS 5.2  --   HGB 5.2* 4.9*  HCT 20.3* 19.7*  MCV 67.9* 67.7*  PLT 318 673    Basic Metabolic Panel: Recent Labs  Lab 05/09/21 1411  NA 137  K 4.2  CL 107  CO2 22  GLUCOSE 125*  BUN 23  CREATININE 1.25*  CALCIUM 9.2    GFR: Estimated Creatinine Clearance: 39.6 mL/min (A) (by C-G formula based on SCr of 1.25  mg/dL (H)).  Liver Function Tests: Recent Labs  Lab 05/09/21 1411  AST 19  ALT 11  ALKPHOS 80  BILITOT 0.5  PROT 6.3*  ALBUMIN 3.3*   Radiological Exams on Admission: No results found.  EKG: Independently reviewed. Vent. rate 84 BPM PR interval 163 ms QRS duration 96 ms QT/QTcB 383/453 ms P-R-T axes 80 73 74 Sinus rhythm Abnormal R-wave progression, early transition Nonspecific T abnrm, anterolateral leads Baseline wander in lead(s) V3  Assessment/Plan Principal Problem:   Melena In the setting of    UGI bleed Place in observation/stepdown. May have clear liquids. N.p.o. in a.m. after breakfast. Monitor hematocrit and hemoglobin. Continue pantoprazole 40 mg IVP every 12 hours. Gastroenterology will evaluate and likely scope tomorrow.  Active Problems:   Iron deficiency anemia due to chronic blood loss Currently being transfused. Monitor hematocrit and hemoglobin. Transfuse as needed. Resume iron infusions as scheduled.     Hypertension Continue amlodipine 5 mg p.o. daily. Continue carvedilol 12.5 mg p.o. twice daily. Monitor blood pressure and heart rate.    Hypercholesteremia Continue atorvastatin 10 mg p.o. daily.    Chronic kidney disease (CKD) stage G3a/A1 GFR is more than 60 mL/min. Monitor renal function electrolytes.    Squamous cell lung cancer, right [facility]) Follow-up with oncology as scheduled.   DVT prophylaxis: SCDs. Code Status:   Full code. Family Communication:   Disposition Plan:   Patient is from:  Home.  Anticipated DC to:  Home.  Anticipated DC date:  05/11/2021.   Anticipated DC barriers: Clinical status/consultant sign off. Consults called:  Gastroenterology is involved.. Admission status:  Ulceration/stepdown.  Severity of Illness:  High severity after presenting with a clinical picture of UGI bleed and symptomatic anemia with a hemoglobin level of 4.9 g/dL.  Reubin Milan MD [facility]  How to contact the [facility] Attending or Consulting provider [place] or covering provider during after hours [place], for this patient?   Check the care team in [facility] and look for a) attending/consulting [facility] provider listed and b) the [facility] team listed Log into [email or web address] and use [place]'s universal password to access. If you do not have the password, please contact the hospital operator. Locate the [facility] provider you are looking for under [facility] and page to a number that you can be directly reached. If you still have difficulty reaching the provider, please page the [facility] (Director on Call) for the Hospitalists listed on amion for assistance.  05/09/2021, 7:41 PM   This document was prepared using Dragon voice recognition software and may contain some unintended transcription errors.

## 2021-05-10 ENCOUNTER — Encounter (HOSPITAL_COMMUNITY)
Admission: EM | Disposition: A | Discharge status: Home / Self Care | Payer: Self-pay | Source: Home / Self Care | Attending: Internal Medicine

## 2021-05-10 ENCOUNTER — Observation Stay (HOSPITAL_COMMUNITY): Payer: PPO | Admitting: Certified Registered"

## 2021-05-10 ENCOUNTER — Encounter (HOSPITAL_COMMUNITY): Payer: Self-pay | Admitting: Internal Medicine

## 2021-05-10 ENCOUNTER — Encounter (HOSPITAL_COMMUNITY): Payer: Self-pay | Admitting: Hematology

## 2021-05-10 DIAGNOSIS — E7800 Pure hypercholesterolemia, unspecified: Secondary | ICD-10-CM | POA: Diagnosis present

## 2021-05-10 DIAGNOSIS — Z7982 Long term (current) use of aspirin: Secondary | ICD-10-CM | POA: Diagnosis not present

## 2021-05-10 DIAGNOSIS — K317 Polyp of stomach and duodenum: Secondary | ICD-10-CM | POA: Diagnosis present

## 2021-05-10 DIAGNOSIS — I10 Essential (primary) hypertension: Secondary | ICD-10-CM | POA: Diagnosis not present

## 2021-05-10 DIAGNOSIS — K219 Gastro-esophageal reflux disease without esophagitis: Secondary | ICD-10-CM | POA: Diagnosis present

## 2021-05-10 DIAGNOSIS — D62 Acute posthemorrhagic anemia: Secondary | ICD-10-CM | POA: Diagnosis present

## 2021-05-10 DIAGNOSIS — Z20822 Contact with and (suspected) exposure to covid-19: Secondary | ICD-10-CM | POA: Diagnosis present

## 2021-05-10 DIAGNOSIS — M1990 Unspecified osteoarthritis, unspecified site: Secondary | ICD-10-CM | POA: Diagnosis present

## 2021-05-10 DIAGNOSIS — I129 Hypertensive chronic kidney disease with stage 1 through stage 4 chronic kidney disease, or unspecified chronic kidney disease: Secondary | ICD-10-CM | POA: Diagnosis present

## 2021-05-10 DIAGNOSIS — N1831 Chronic kidney disease, stage 3a: Secondary | ICD-10-CM | POA: Diagnosis present

## 2021-05-10 DIAGNOSIS — D631 Anemia in chronic kidney disease: Secondary | ICD-10-CM | POA: Diagnosis present

## 2021-05-10 DIAGNOSIS — K922 Gastrointestinal hemorrhage, unspecified: Secondary | ICD-10-CM | POA: Diagnosis present

## 2021-05-10 DIAGNOSIS — C3491 Malignant neoplasm of unspecified part of right bronchus or lung: Secondary | ICD-10-CM | POA: Diagnosis present

## 2021-05-10 DIAGNOSIS — Z79899 Other long term (current) drug therapy: Secondary | ICD-10-CM | POA: Diagnosis not present

## 2021-05-10 DIAGNOSIS — Z87891 Personal history of nicotine dependence: Secondary | ICD-10-CM | POA: Diagnosis not present

## 2021-05-10 DIAGNOSIS — Z8673 Personal history of transient ischemic attack (TIA), and cerebral infarction without residual deficits: Secondary | ICD-10-CM | POA: Diagnosis not present

## 2021-05-10 DIAGNOSIS — K31811 Angiodysplasia of stomach and duodenum with bleeding: Secondary | ICD-10-CM | POA: Diagnosis present

## 2021-05-10 HISTORY — PX: ESOPHAGOGASTRODUODENOSCOPY (EGD) WITH PROPOFOL: SHX5813

## 2021-05-10 HISTORY — PX: HOT HEMOSTASIS: SHX5433

## 2021-05-10 HISTORY — PX: ENTEROSCOPY: SHX5533

## 2021-05-10 LAB — COMPREHENSIVE METABOLIC PANEL
ALT: 11 U/L (ref 0–44)
AST: 18 U/L (ref 15–41)
Albumin: 3.4 g/dL — ABNORMAL LOW (ref 3.5–5.0)
Alkaline Phosphatase: 77 U/L (ref 38–126)
Anion gap: 7 (ref 5–15)
BUN: 21 mg/dL (ref 8–23)
CO2: 24 mmol/L (ref 22–32)
Calcium: 9 mg/dL (ref 8.9–10.3)
Chloride: 107 mmol/L (ref 98–111)
Creatinine, Ser: 1.17 mg/dL (ref 0.61–1.24)
GFR, Estimated: >60 mL/min (ref >60)
Glucose, Bld: 94 mg/dL (ref 70–99)
Potassium: 4.1 mmol/L (ref 3.5–5.1)
Sodium: 138 mmol/L (ref 135–145)
Total Bilirubin: 1.1 mg/dL (ref 0.3–1.2)
Total Protein: 6.1 g/dL — ABNORMAL LOW (ref 6.5–8.1)

## 2021-05-10 LAB — BPAM RBC
Blood Product Expiration Date: 202207292359
Blood Product Expiration Date: 202207292359
ISSUE DATE / TIME: 202207062025
ISSUE DATE / TIME: 202207062244
Unit Type and Rh: 6200
Unit Type and Rh: 6200

## 2021-05-10 LAB — TYPE AND SCREEN
ABO/RH(D): A POS
Antibody Screen: NEGATIVE
Unit division: 0
Unit division: 0

## 2021-05-10 LAB — CBC
HCT: 28.1 % — ABNORMAL LOW (ref 39.0–52.0)
Hemoglobin: 8 g/dL — ABNORMAL LOW (ref 13.0–17.0)
MCH: 20.9 pg — ABNORMAL LOW (ref 26.0–34.0)
MCHC: 28.5 g/dL — ABNORMAL LOW (ref 30.0–36.0)
MCV: 73.4 fL — ABNORMAL LOW (ref 80.0–100.0)
Platelets: 266 10*3/uL (ref 150–400)
RBC: 3.83 MIL/uL — ABNORMAL LOW (ref 4.22–5.81)
RDW: 25.9 % — ABNORMAL HIGH (ref 11.5–15.5)
WBC: 9.4 10*3/uL (ref 4.0–10.5)
nRBC: 0.3 % — ABNORMAL HIGH (ref 0.0–0.2)

## 2021-05-10 SURGERY — ESOPHAGOGASTRODUODENOSCOPY (EGD) WITH PROPOFOL
Anesthesia: General

## 2021-05-10 MED ORDER — DENOSUMAB 60 MG/ML ~~LOC~~ SOSY
PREFILLED_SYRINGE | SUBCUTANEOUS | Status: AC
  Filled 2021-05-10: qty 1

## 2021-05-10 MED ORDER — PROPOFOL 10 MG/ML IV BOLUS
INTRAVENOUS | Status: DC | PRN
  Administered 2021-05-10: 60 mg via INTRAVENOUS
  Administered 2021-05-10: 100 ug/kg/min via INTRAVENOUS

## 2021-05-10 MED ORDER — LACTATED RINGERS IV SOLN: INTRAVENOUS | Status: AC

## 2021-05-10 MED ORDER — CHLORHEXIDINE GLUCONATE CLOTH 2 % EX PADS
6.0000 | MEDICATED_PAD | Freq: Every day | CUTANEOUS | Status: DC
  Administered 2021-05-10: 6 via TOPICAL

## 2021-05-10 MED ORDER — STERILE WATER FOR IRRIGATION IR SOLN
Status: DC | PRN
  Administered 2021-05-10: 100 mL

## 2021-05-10 MED ORDER — PROPOFOL 10 MG/ML IV BOLUS
INTRAVENOUS | Status: AC
  Filled 2021-05-10: qty 20

## 2021-05-10 MED ORDER — LIDOCAINE HCL (CARDIAC) PF 100 MG/5ML IV SOSY
PREFILLED_SYRINGE | INTRAVENOUS | Status: DC | PRN
  Administered 2021-05-10: 80 mg via INTRAVENOUS

## 2021-05-10 MED ORDER — SODIUM CHLORIDE 0.9 % IV SOLN: INTRAVENOUS | Status: DC

## 2021-05-10 MED ORDER — CARVEDILOL 12.5 MG PO TABS
12.5000 mg | ORAL_TABLET | Freq: Two times a day (BID) | ORAL | Status: DC
  Administered 2021-05-10 (×2): 12.5 mg via ORAL
  Filled 2021-05-10 (×3): qty 1

## 2021-05-10 MED ORDER — ATORVASTATIN CALCIUM 10 MG PO TABS
10.0000 mg | ORAL_TABLET | Freq: Every day | ORAL | Status: DC
  Administered 2021-05-10: 10 mg via ORAL
  Filled 2021-05-10: qty 1

## 2021-05-10 MED ORDER — AMLODIPINE BESYLATE 5 MG PO TABS
5.0000 mg | ORAL_TABLET | Freq: Every day | ORAL | Status: DC
  Administered 2021-05-10: 5 mg via ORAL
  Filled 2021-05-10 (×2): qty 1

## 2021-05-10 MED ORDER — LANREOTIDE ACETATE 120 MG/0.5ML ~~LOC~~ SOLN
SUBCUTANEOUS | Status: AC
  Filled 2021-05-10: qty 120

## 2021-05-10 NOTE — Progress Notes (Signed)
PROGRESS NOTE    Jared Schmidt  WUJ:[id number] DOB: 1936-02-08 DOA: 05/09/2021 PCP: Heather Roberts, NP   Brief Narrative:   Jared Schmidt is a 85 y.o. male with medical history significant of microcytic anemia in the setting of chronic blood loss, osteoarthritis, GERD, hyperlipidemia, hypertension, history of TIA, history of bleeding PUD in 2019 who is coming to the emergency department after his most recent blood work showed a hemoglobin of 4.9 g/dL.  The patient stated that he has missed the last couple iron IV infusions.  He has had several melanotic stools and is status post PRBC transfusion with improvement in hemoglobin levels.  GI plans for endoscopy later today.  Assessment & Plan:   Principal Problem:   UGI bleed Active Problems:   Hypertension   Hypercholesteremia   Iron deficiency anemia due to chronic blood loss   Chronic kidney disease (CKD) stage G3a/A1, moderately decreased glomerular filtration rate (GFR) between 45-59 mL/min/1.73 square meter and albuminuria creatinine ratio less than 30 mg/g (HCC)   Squamous cell lung cancer, right (HCC)   Acute blood loss anemia suspect secondary to upper GI bleed -Hemoglobin improved after PRBC infusion -Noted to have melanotic stools -Currently n.p.o. -PPI twice daily -Monitor CBC in a.m. -GI evaluation ongoing with plans for upper endoscopy later today  History of iron deficiency anemia due to chronic blood loss -Resume iron infusions as previously scheduled after this admission  Hypertension -Continue amlodipine and carvedilol -Stable blood pressure control noted  Dyslipidemia -Continue atorvastatin  Squamous cell lung cancer -Follow-up with oncology as scheduled  DVT prophylaxis: SCDs Code Status: Full Family Communication: Patient will notify family Disposition Plan:  Status is: Observation  The patient will require care spanning > 2 midnights and should be moved to inpatient because: Ongoing diagnostic  testing needed not appropriate for outpatient work up and IV treatments appropriate due to intensity of illness or inability to take PO  Dispo: The patient is from: Home              Anticipated d/c is to: Home              Patient currently is not medically stable to d/c.   Difficult to place patient No   Consultants:  GI  Procedures:  See below  Antimicrobials:  None   Subjective: Patient seen and evaluated today with no new acute complaints or concerns. No acute concerns or events noted overnight.  He denies abdominal pain and has had no further bleeding since admission.  Objective: Vitals:   05/10/21 0800 05/10/21 0900 05/10/21 1000 05/10/21 1112  BP: 139/72 (!) 112/56 (!) 151/57   Pulse: 85 78 73   Resp: 16 15 15    Temp:    98.1 F (36.7 C)  TempSrc:    Oral  SpO2: 100% 99% 98%   Weight:      Height:        Intake/Output Summary (Last 24 hours) at 05/10/2021 1113 Last data filed at 05/10/2021 0834 Gross per 24 hour  Intake 476.25 ml  Output 700 ml  Net -223.75 ml   Filed Weights   05/09/21 1729 05/09/21 2149  Weight: 62.6 kg 58.6 kg    Examination:  General exam: Appears calm and comfortable  Respiratory system: Clear to auscultation. Respiratory effort normal. Cardiovascular system: S1 & S2 heard, RRR.  Gastrointestinal system: Abdomen is soft Central nervous system: Alert and awake Extremities: No edema Skin: No significant lesions noted Psychiatry: Flat affect.  Data Reviewed: I have personally reviewed following labs and imaging studies  CBC: Recent Labs  Lab 05/09/21 1411 05/09/21 1755 05/10/21 0315  WBC 7.2 6.5 9.4  NEUTROABS 5.2  --   --   HGB 5.2* 4.9* 8.0*  HCT 20.3* 19.7* 28.1*  MCV 67.9* 67.7* 73.4*  PLT 318 299 266   Basic Metabolic Panel: Recent Labs  Lab 05/09/21 1411 05/09/21 2038 05/10/21 0315  NA 137 135 138  K 4.2 4.5 4.1  CL 107 106 107  CO2 22 23 24   GLUCOSE 125* 116* 94  BUN 23 23 21   CREATININE 1.25* 1.15  1.17  CALCIUM 9.2 8.7* 9.0   GFR: Estimated Creatinine Clearance: 39.7 mL/min (by C-G formula based on SCr of 1.17 mg/dL). Liver Function Tests: Recent Labs  Lab 05/09/21 1411 05/09/21 2038 05/10/21 0315  AST 19 22 18   ALT 11 12 11   ALKPHOS 80 76 77  BILITOT 0.5 0.4 1.1  PROT 6.3* 6.2* 6.1*  ALBUMIN 3.3* 3.2* 3.4*   No results for input(s): LIPASE, AMYLASE in the last 168 hours. No results for input(s): AMMONIA in the last 168 hours. Coagulation Profile: No results for input(s): INR, PROTIME in the last 168 hours. Cardiac Enzymes: No results for input(s): CKTOTAL, CKMB, CKMBINDEX, TROPONINI in the last 168 hours. BNP (last 3 results) No results for input(s): PROBNP in the last 8760 hours. HbA1C: No results for input(s): HGBA1C in the last 72 hours. CBG: No results for input(s): GLUCAP in the last 168 hours. Lipid Profile: No results for input(s): CHOL, HDL, LDLCALC, TRIG, CHOLHDL, LDLDIRECT in the last 72 hours. Thyroid Function Tests: No results for input(s): TSH, T4TOTAL, FREET4, T3FREE, THYROIDAB in the last 72 hours. Anemia Panel: Recent Labs    05/09/21 1411  FERRITIN 11*  TIBC 373  IRON 10*   Sepsis Labs: No results for input(s): PROCALCITON, LATICACIDVEN in the last 168 hours.  Recent Results (from the past 240 hour(s))  Resp Panel by RT-PCR (Flu A&B, Covid) Nasopharyngeal Swab     Status: None   Collection Time: 05/09/21  7:50 PM   Specimen: Nasopharyngeal Swab; Nasopharyngeal(NP) swabs in vial transport medium  Result Value Ref Range Status   SARS Coronavirus 2 by RT PCR NEGATIVE NEGATIVE Final    Comment: (NOTE) SARS-CoV-2 target nucleic acids are NOT DETECTED.  The SARS-CoV-2 RNA is generally detectable in upper respiratory specimens during the acute phase of infection. The lowest concentration of SARS-CoV-2 viral copies this assay can detect is 138 copies/mL. A negative result does not preclude SARS-Cov-2 infection and should not be used as the  sole basis for treatment or other patient management decisions. A negative result may occur with  improper specimen collection/handling, submission of specimen other than nasopharyngeal swab, presence of viral mutation(s) within the areas targeted by this assay, and inadequate number of viral copies(<138 copies/mL). A negative result must be combined with clinical observations, patient history, and epidemiological information. The expected result is Negative.  Fact Sheet for Patients:  BloggerCourse.com  Fact Sheet for Healthcare Providers:  SeriousBroker.it  This test is no t yet approved or cleared by the [place] FDA and  has been authorized for detection and/or diagnosis of SARS-CoV-2 by FDA under an Emergency Use Authorization (EUA). This EUA will remain  in effect (meaning this test can be used) for the duration of the COVID-19 declaration under Section 564(b)(1) of the Act, 21 U.S.C.section 360bbb-3(b)(1), unless the authorization is terminated  or revoked sooner.  Influenza A by PCR NEGATIVE NEGATIVE Final   Influenza B by PCR NEGATIVE NEGATIVE Final    Comment: (NOTE) The Xpert Xpress SARS-CoV-2/FLU/RSV plus assay is intended as an aid in the diagnosis of influenza from Nasopharyngeal swab specimens and should not be used as a sole basis for treatment. Nasal washings and aspirates are unacceptable for Xpert Xpress SARS-CoV-2/FLU/RSV testing.  Fact Sheet for Patients: BloggerCourse.com  Fact Sheet for Healthcare Providers: SeriousBroker.it  This test is not yet approved or cleared by the [place] FDA and has been authorized for detection and/or diagnosis of SARS-CoV-2 by FDA under an Emergency Use Authorization (EUA). This EUA will remain in effect (meaning this test can be used) for the duration of the COVID-19 declaration under Section 564(b)(1) of the  Act, 21 U.S.C. section 360bbb-3(b)(1), unless the authorization is terminated or revoked.  Performed at [facility], [address] [place], [place] [address]   MRSA Next Gen by PCR, Nasal     Status: None   Collection Time: 05/09/21  9:36 PM   Specimen: Nasal Mucosa; Nasal Swab  Result Value Ref Range Status   MRSA by PCR Next Gen NOT DETECTED NOT DETECTED Final    Comment: (NOTE) The GeneXpert MRSA Assay (FDA approved for NASAL specimens only), is one component of a comprehensive MRSA colonization surveillance program. It is not intended to diagnose MRSA infection nor to guide or monitor treatment for MRSA infections. Test performance is not FDA approved in patients less than 7 years old. Performed at [facility], [address], [place], [place] [address]          Radiology Studies: No results found.      Scheduled Meds:  amLODipine  5 mg Oral Daily   atorvastatin  10 mg Oral Q supper   carvedilol  12.5 mg Oral BID WC   Chlorhexidine Gluconate Cloth  6 each Topical Daily   feeding supplement  1 Container Oral TID BM   pantoprazole (PROTONIX) IV  40 mg Intravenous Q12H   Continuous Infusions:  lactated ringers 75 mL/hr at 05/10/21 0834     LOS: 0 days    Time spent: 35 minutes    Jaslin Novitski Hoover Brunette, DO [facility]  If 7PM-7AM, please contact night-coverage [email or web address] 05/10/2021, 11:13 AM

## 2021-05-10 NOTE — Anesthesia Postprocedure Evaluation (Signed)
Anesthesia Post Note  Patient: Jared Schmidt  Procedure(s) Performed: ESOPHAGOGASTRODUODENOSCOPY (EGD) WITH PROPOFOL HOT HEMOSTASIS (ARGON PLASMA COAGULATION/BICAP) ENTEROSCOPY  Patient location during evaluation: PACU Anesthesia Type: General Level of consciousness: awake and alert, oriented and sedated Pain management: pain level controlled Vital Signs Assessment: post-procedure vital signs reviewed and stable Respiratory status: spontaneous breathing and respiratory function stable Cardiovascular status: blood pressure returned to baseline and stable Postop Assessment: no apparent nausea or vomiting Anesthetic complications: no   No notable events documented.   Last Vitals:  Vitals:   05/10/21 1600 05/10/21 1615  BP: (!) 88/58 113/66  Pulse: 94 88  Resp: 12 15  Temp:    SpO2:  98%    Last Pain:  Vitals:   05/10/21 1615  TempSrc:   PainSc: 0-No pain                 Killian Ress C Jlon Betker

## 2021-05-10 NOTE — Transfer of Care (Signed)
Immediate Anesthesia Transfer of Care Note  Patient: Jared Schmidt  Procedure(s) Performed: ESOPHAGOGASTRODUODENOSCOPY (EGD) WITH PROPOFOL HOT HEMOSTASIS (ARGON PLASMA COAGULATION/BICAP) ENTEROSCOPY  Patient Location: PACU  Anesthesia Type:General  Level of Consciousness: drowsy, patient cooperative and responds to stimulation  Airway & Oxygen Therapy: Patient Spontanous Breathing  Post-op Assessment: Report given to RN, Post -op Vital signs reviewed and stable and Patient moving all extremities X 4  Post vital signs: Reviewed and stable  Last Vitals:  Vitals Value Taken Time  BP    Temp    Pulse 94 05/10/21 1559  Resp 19 05/10/21 1559  SpO2 94 % 05/10/21 1559  Vitals shown include unvalidated device data.  Last Pain:  Vitals:   05/10/21 1528  TempSrc:   PainSc: 0-No pain         Complications: No notable events documented.

## 2021-05-10 NOTE — Consult Note (Signed)
Referring Provider: Rodena Goldmann, DO Primary Care Physician:  Noreene Larsson, NP Primary Gastroenterologist:  Garfield Cornea, MD  Reason for Consultation:  GI bleed  HPI: Jared Schmidt is a 85 y.o. male with past medical history significant for IDA due to chronic blood loss with history of gastric AVM previously followed by GI and hematology (bone marrow biopsy c/w IDA), stroke, hypertension, recent diagnosis of squamous cell carcinoma of the right lung presenting to the emergency department for low hemoglobin.  Patient reported missing appointment for iron infusions, blood drawn day of presentation with markedly low hemoglobin of 5.2. patient complained of progressive weakness, fatigue, especially with exertion.  Patient notes that his stools are dark/black and he didn't really pay much attention. Thought it was normal for him.  He reports being off iron for several months. No Pepto. Denies any abdominal pain, nausea or vomiting, constipation, diarrhea, hematochezia. No hemoptysis, nosebleeds, hematuria.  Denies NSAIDs.  Reports being on baby ASA daily. Has been receiving iron infusions in the direction of his hematologist/oncologist.  In the ED, repeat hemoglobin was 4.9, hematocrit 19.7, MCV 67.7, platelets 299 9000.  Hemoglobin on May 4 was 10.1.  Ferritin 11.  BUN 23, creatinine 1.15.Heme Positive stool in the ED.  He received 2 units of packed red blood cells overnight.  Hemoglobin up to 8.  EGD November 2019: -Normal esophagus. -Red blood in the entire stomach. 3 actively bleeding AVMs versus Dieulafoys - sealed with clips as described above  Given small bowel capsule November 2019: -Fresh blood noted within the stomach as well as the proximal small bowel, source not identified.   -Multiple questional AVMs and erosions throughout the small bowel.  EGD August 2019: -Multiple erosions in the stomach and duodenal bulb with gastric biopsy showing chronic gastritis with intestinal  metaplasia, no H. pylori   Colonoscopy August 2019: -Diverticulosis -Internal hemorrhoids -9 mm polyp removed, pathology showed inflamed hyperplastic polyp   Prior to Admission medications   Medication Sig Start Date End Date Taking? Authorizing Provider  acetaminophen (TYLENOL) 500 MG tablet Take 500 mg by mouth every 6 (six) hours as needed for moderate pain or mild pain.    [provider]  amLODipine (NORVASC) 5 MG tablet Take 1 tablet (5 mg total) by mouth daily. 02/06/21   Perlie Mayo, NP  aspirin EC 81 MG tablet Take 81 mg by mouth daily. Swallow whole.    [provider]  atorvastatin (LIPITOR) 10 MG tablet Take 1 tablet (10 mg total) by mouth daily with supper. 02/05/21   Perlie Mayo, NP  carvedilol (COREG) 12.5 MG tablet TAKE 1 TABLET BY MOUTH TWICE DAILY with a meal 04/10/21   Noreene Larsson, NP  Multiple Vitamins-Minerals (MULTIVITAMIN WITH MINERALS) tablet Take 1 tablet by mouth daily.    [provider]  pantoprazole (PROTONIX) 40 MG tablet Take 1 tablet (40 mg total) by mouth daily. 03/29/21   Fayrene Helper, MD  vitamin C (ASCORBIC ACID) 500 MG tablet Take 500 mg by mouth daily.    [provider]  vitamin E 100 UNIT capsule Take 100 Units by mouth daily.     [provider]    Current Facility-Administered Medications  Medication Dose Route Frequency Provider Last Rate Last Admin   acetaminophen (TYLENOL) tablet 650 mg  650 mg Oral Q6H PRN Reubin Milan, MD       Or   acetaminophen (TYLENOL) suppository 650 mg  650 mg Rectal Q6H PRN  Reubin Milan, MD       amLODipine [facility]) tablet 5 mg  5 mg Oral Daily Reubin Milan, MD       atorvastatin (LIPITOR) tablet 10 mg  10 mg Oral Q supper Reubin Milan, MD       carvedilol (COREG) tablet 12.5 mg  12.5 mg Oral BID WC Reubin Milan, MD       Chlorhexidine Gluconate Cloth 2 % PADS 6 each  6 each Topical Daily Manuella Ghazi, Pratik D, DO       feeding  supplement (BOOST / RESOURCE BREEZE) liquid 1 Container  1 Container Oral TID BM Reubin Milan, MD       lactated ringers infusion   Intravenous Continuous Manuella Ghazi, Pratik D, DO       ondansetron [facility]) tablet 4 mg  4 mg Oral Q6H PRN Reubin Milan, MD       Or   ondansetron [facility]) injection 4 mg  4 mg Intravenous Q6H PRN Reubin Milan, MD       pantoprazole (PROTONIX) injection 40 mg  40 mg Intravenous Q12H Reubin Milan, MD        Allergies as of 05/09/2021   (No Known Allergies)    Past Medical History:  Diagnosis Date   Anemia    Arthritis    GERD (gastroesophageal reflux disease)    Hypercholesteremia    Hypertension    Iron deficiency anemia due to chronic blood loss 03/17/2018   Iron deficiency anemia due to chronic blood loss 03/17/2018   Stroke ([place])    TIA 2020   Symptomatic anemia 01/20/2018   Vitamin B deficiency    patient denies    Past Surgical History:  Procedure Laterality Date   BIOPSY  06/24/2018   Procedure: BIOPSY;  Surgeon: Daneil Dolin, MD;  [facility];  Service: Endoscopy;;  gastric    COLONOSCOPY N/A 06/24/2018   Dr. Gala Romney: Internal hemorrhoids, diverticulosis, 9 mm polyp removed from the sigmoid colon which was hyperplastic.   ESOPHAGOGASTRODUODENOSCOPY N/A 06/24/2018   Dr. Gala Romney: Multiple erosions in the stomach and duodenal bulb, gastric biopsies with chronic gastritis with intestinal metaplasia, no H. pylori.   ESOPHAGOGASTRODUODENOSCOPY (EGD) WITH PROPOFOL N/A 09/17/2018   Procedure: ESOPHAGOGASTRODUODENOSCOPY (EGD) WITH PROPOFOL;  Surgeon: Daneil Dolin, MD;  [facility];  Service: Endoscopy;  Laterality: N/A;  11:00am   GIVENS CAPSULE STUDY N/A 09/15/2018   Procedure: GIVENS CAPSULE STUDY;  Surgeon: Daneil Dolin, MD;  [facility];  Service: Endoscopy;  Laterality: N/A;  7:30am   NO PAST SURGERIES     none     POLYPECTOMY  06/24/2018   Procedure: POLYPECTOMY;  Surgeon: Daneil Dolin, MD;  [facility];  Service: Endoscopy;;  sigmoid polyp hs   VIDEO BRONCHOSCOPY WITH ENDOBRONCHIAL NAVIGATION N/A 03/09/2021   Procedure: VIDEO BRONCHOSCOPY WITH ENDOBRONCHIAL NAVIGATION;  Surgeon: Melrose Nakayama, MD;  Location: [place];  Service: Thoracic;  Laterality: N/A;   VIDEO BRONCHOSCOPY WITH ENDOBRONCHIAL ULTRASOUND N/A 03/09/2021   Procedure: VIDEO BRONCHOSCOPY WITH ENDOBRONCHIAL ULTRASOUND;  Surgeon: Melrose Nakayama, MD;  Location: [facility];  Service: Thoracic;  Laterality: N/A;    Family History  Problem Relation Age of Onset   Colon cancer Neg Hx     Social History   Socioeconomic History   Marital status: Married    Spouse name: Jamonta Goerner   Number of children: Not on file   Years  of education: Not on file   Highest education level: 8th grade  Occupational History   Occupation: retired  Tobacco Use   Smoking status: Former    Packs/day: 0.50    Years: 67.00    Pack years: 33.50    Types: Cigarettes    Quit date: 12/11/2017    Years since quitting: 3.4   Smokeless tobacco: Never  Vaping Use   Vaping Use: Never used  Substance and Sexual Activity   Alcohol use: No   Drug use: No   Sexual activity: Not Currently  Other Topics Concern   Not on file  Social History Narrative   Lives with wife [name] in [place]   Dog named poppet      Enjoys hanging around [employer]: eats all food groups   Caffeine: coffee   Water: 4-5 cups daily      Wears seat belt    Does not use phone while driving    Oceanographer at home    Social Determinants of Health   Financial Resource Strain: Low Risk    Difficulty of Paying Living Expenses: Not hard at all  Food Insecurity: No Food Insecurity   Worried About Charity fundraiser in the Last Year: Never true   Arboriculturist in the Last Year: Never true  Transportation Needs: No Transportation Needs   Lack of Transportation (Medical): No   Lack of Transportation (Non-Medical): No   Physical Activity: Inactive   Days of Exercise per Week: 0 days   Minutes of Exercise per Session: 0 min  Stress: No Stress Concern Present   Feeling of Stress : Only a little  Social Connections: Socially Isolated   Frequency of Communication with Friends and Family: Once a week   Frequency of Social Gatherings with Friends and Family: Never   Attends Religious Services: Never   Printmaker: No   Attends Music therapist: Never   Marital Status: Married  Human resources officer Violence: Not At Risk   Fear of Current or Ex-Partner: No   Emotionally Abused: No   Physically Abused: No   Sexually Abused: No     ROS:  General: Negative for anorexia, weight loss, fever, chills, fatigue, +weakness. Eyes: Negative for vision changes.  ENT: Negative for hoarseness, difficulty swallowing , nasal congestion. CV: Negative for chest pain, angina, palpitations,+ dyspnea on exertion, peripheral edema.  Respiratory: Negative for dyspnea at rest,+ dyspnea on exertion, cough, sputum, wheezing.  GI: See history of present illness. GU:  Negative for dysuria, hematuria, urinary incontinence, urinary frequency, nocturnal urination.  MS: Negative for joint pain, low back pain.  Derm: Negative for rash or itching.  Neuro: Negative for weakness, abnormal sensation, seizure, frequent headaches, memory loss, confusion.  Psych: Negative for anxiety, depression, suicidal ideation, hallucinations.  Endo: Negative for unusual weight change.  Heme: Negative for bruising or bleeding. Allergy: Negative for rash or hives.       Physical Examination: Vital signs in last 24 hours: Temp:  [97.7 F (36.5 C)-98.1 F (36.7 C)] 98 F (36.7 C) (07/07 0108) Pulse Rate:  [83-106] 89 (07/07 0108) Resp:  [14-25] 14 (07/07 0108) BP: (138-167)/(59-88) 149/60 (07/07 0108) SpO2:  [94 %-100 %] 97 % (07/07 0108) Weight:  [58.6 kg-62.6 kg] 58.6 kg (07/06 2149) Last BM Date:  05/09/21  General: Well-nourished, well-developed in no acute distress.  Head: Normocephalic, atraumatic.   Eyes: Conjunctiva pink, no icterus.  Mouth: Oropharyngeal mucosa moist and pink , no lesions erythema or exudate. Neck: Supple without thyromegaly, masses, or lymphadenopathy.  Lungs: Clear to auscultation bilaterally.  Heart: Regular rate and rhythm, no murmurs rubs or gallops.  Abdomen: Bowel sounds are normal, nontender, nondistended, no hepatosplenomegaly or masses, no abdominal bruits or    hernia , no rebound or guarding.   Rectal: not performed Extremities: No lower extremity edema, clubbing, deformity.  Neuro: Alert and oriented x 4 , grossly normal neurologically.  Skin: Warm and dry, no rash or jaundice.   Psych: Alert and cooperative, normal mood and affect.        Intake/Output from previous day: 07/06 0701 - 07/07 0700 In: 476.3 [Blood:476.3] Out: 625 [Urine:625] Intake/Output this shift: No intake/output data recorded.  Lab Results: CBC Recent Labs    05/09/21 1411 05/09/21 1755 05/10/21 0315  WBC 7.2 6.5 9.4  HGB 5.2* 4.9* 8.0*  HCT 20.3* 19.7* 28.1*  MCV 67.9* 67.7* 73.4*  PLT 318 299 266   BMET Recent Labs    05/09/21 1411 05/09/21 2038 05/10/21 0315  NA 137 135 138  K 4.2 4.5 4.1  CL 107 106 107  CO2 '22 23 24  ' GLUCOSE 125* 116* 94  BUN '23 23 21  ' CREATININE 1.25* 1.15 1.17  CALCIUM 9.2 8.7* 9.0   LFT Recent Labs    05/09/21 1411 05/09/21 2038 05/10/21 0315  BILITOT 0.5 0.4 1.1  ALKPHOS 80 76 77  AST '19 22 18  ' ALT '11 12 11  ' PROT 6.3* 6.2* 6.1*  ALBUMIN 3.3* 3.2* 3.4*   Lab Results  Component Value Date   IRON 10 (L) 05/09/2021   TIBC 373 05/09/2021   FERRITIN 11 (L) 05/09/2021    Lipase No results for input(s): LIPASE in the last 72 hours.  PT/INR No results for input(s): LABPROT, INR in the last 72 hours.    Imaging Studies: DG Chest 2 View  Result Date: 04/24/2021 CLINICAL DATA:  Known right lung cancer.   Left-sided pain. EXAM: CHEST - 2 VIEW COMPARISON:  Mar 07, 2021 FINDINGS: The patient has known right perihilar lung cancer is stable on today's imaging. No pneumothorax. The heart, hila, and mediastinum are unchanged. No pulmonary nodules or masses otherwise seen. No new infiltrate. IMPRESSION: 1. The patient's known right-sided lung cancer is stable based on this chest x-ray. 2. No cause for left-sided pain identified. Specifically, no pneumothorax or infiltrate. Electronically Signed   By: Dorise Bullion III M.D   On: 04/24/2021 17:22  [4 week]   Impression: 85 y/o male with history of IDA to chronic blood loss with history of gastric and small bowel AVMs, stroke, recent diagnosis of squamous cell carcinoma of the right lung presenting to the emergency department due to low hemoglobin.  Profound IDA: Recent black stools per patient.  Heme positive in the ED.  Hemoglobin of 4.9, ferritin 11, BUN 23.  Hemoglobin up to 8 after 2 units of packed red blood cells.  His hemoglobin had been stable in the 10 range previously.  Last EGD in 2019 with red blood in the entire stomach, 3 actively bleeding AVMs versus Dieulafoys, sealed with clips.  Previous capsule endoscopy in 2019 with suspected small bowel AVMs as well.  Colonoscopy completed 2019, diverticulosis, internal hemorrhoids.  Suspect acute on chronic anemia due to GI bleeding from upper source versus proximal small bowel, possibly AVMs in the setting of aspirin 81 mg daily.  Plan: Transfuse as needed. He will need to continue  to follow with hematology for IDA management, iron infusions as needed. Consider upper endoscopy with push enteroscopy, timing to be determined.  We would like to thank you for the opportunity to participate in the care of [name].  Laureen Ochs. Bernarda Caffey [facility] [phone] 7/7/202211:03 AM    LOS: 0 days

## 2021-05-10 NOTE — Op Note (Signed)
[facility] Patient Name: Jared Schmidt Procedure Date: 05/10/2021 3:28 PM MRN: [id number] Date of Birth: June 10, 1936 Attending MD: Gennette Pac , MD CSN: [id number] Age: 85 Admit Type: Inpatient Procedure:                Upper GI endoscopy Indications:              Melena; abnormal VCE - stomach Providers:                Gennette Pac, MD, Edrick Kins, RN, Burke Keels, Technician Referring MD:              Medicines:                Propofol per Anesthesia Complications:            No immediate complications. Estimated Blood Loss:     Estimated blood loss: none. Procedure:                Pre-Anesthesia Assessment:                           - Prior to the procedure, a History and Physical                            was performed, and patient medications and                            allergies were reviewed. The patient's tolerance of                            previous anesthesia was also reviewed. The risks                            and benefits of the procedure and the sedation                            options and risks were discussed with the patient.                            All questions were answered, and informed consent                            was obtained. Prior Anticoagulants: The patient has                            taken no previous anticoagulant or antiplatelet                            agents. ASA Grade Assessment: III - A patient with                            severe systemic disease. After reviewing the risks  and benefits, the patient was deemed in                            satisfactory condition to undergo the procedure.                           After obtaining informed consent, the endoscope was                            passed under direct vision. Throughout the                            procedure, the patient's blood pressure, pulse, and                            oxygen  saturations were monitored continuously. The                            [id number]-[id number] ([id number]) scope was introduced through                            the mouth, and advanced to the proximal jejunum.                            The upper GI endoscopy was accomplished without                            difficulty. The patient tolerated the procedure                            well. Scope In: 3:34:44 PM Scope Out: 3:55:31 PM Total Procedure Duration: 0 hours 20 minutes 47 seconds  Findings:      The examined esophagus was normal. No blood in stomach. Couple of tiny       vascular ectasias in the antrum. 1 tiny polyp in the antrum?"not       manipulated. Pylorus patent. Examination of the bulb revealed vascular       prominence with a couple of areas (see photos). With the pediatric       colonoscope in hand, I gradually advanced the down to what I believe was       the proximal jejunum. The mucosa from the second portion of the duodenum       through the to the proximal jejunum appeared normal.      Scope was brought back up into the stomach/duodenal bulb. The       above-mentioned lesions were ablated with APC utilizing circular probe       at 20 J each. Please see photos.. Impression:               - Normal esophagus. Apparent vascular ectasias in                            the antrum/duodenal bulb as described and                            photographed. Status  post ablation as described                            above. The more distal small intestine well into                            the proximal jejunum appeared normal                           -Mild gastric polyp not manipulated. Moderate Sedation:      Moderate (conscious) sedation was personally administered by an       anesthesia professional. The following parameters were monitored: oxygen       saturation, heart rate, blood pressure, respiratory rate, EKG, adequacy       of pulmonary ventilation, and response to  care. Recommendation:           - Return patient to hospital ward for ongoing care.                           - Clear liquid diet.                           - Continue present medications. Trend H&H. We will                            reassess tomorrow morning. Procedure Code(s):        --- Professional ---                           [phone], Esophagogastroduodenoscopy, flexible,                            transoral; diagnostic, including collection of                            specimen(s) by brushing or washing, when performed                            (separate procedure) Diagnosis Code(s):        --- Professional ---                           K92.1, Melena (includes Hematochezia) CPT copyright 2019 American Medical Association. All rights reserved. The codes documented in this report are preliminary and upon coder review may  be revised to meet current compliance requirements. Gerrit Friends. Teller Wakefield, MD Gennette Pac, MD 05/10/2021 4:16:27 PM This report has been signed electronically. Number of Addenda: 0

## 2021-05-10 NOTE — Anesthesia Preprocedure Evaluation (Addendum)
Anesthesia Evaluation  Patient identified by MRN, date of birth, ID band Patient awake    Reviewed: Allergy & Precautions, NPO status , Patient's Chart, lab work & pertinent test results, reviewed documented beta blocker date and time   History of Anesthesia Complications Negative for: history of anesthetic complications  Airway Mallampati: II  TM Distance: >3 FB Neck ROM: Full    Dental  (+) Edentulous Upper, Edentulous Lower   Pulmonary former smoker,  Right lung cancer   Pulmonary exam normal breath sounds clear to auscultation       Cardiovascular Exercise Tolerance: Good hypertension, Pt. on medications and Pt. on home beta blockers Normal cardiovascular exam Rhythm:Regular Rate:Normal  09-May-2021 18:44:08 [facility] ROUTINE RECORD April 06, 1936 (85 yr) Male Black Room:ER11 Loc:499 Technician: TM Test ind: Comment 1:: Comment 2:: Comment 3:: Comment 4:: Vent. rate 84 BPM PR interval 163 ms QRS duration 96 ms QT/QTcB 383/453 ms P-R-T axes 80 73 74 Sinus rhythm Abnormal R-wave progression, early transition Nonspecific T abnrm, anterolateral leads Baseline wander in lead(s) V3 Confirmed by Noemi Chapel [phone]) on 05/09/2021 6:52:55 PM   Neuro/Psych CVA    GI/Hepatic GERD  Medicated and Controlled,  Endo/Other    Renal/GU Renal InsufficiencyRenal disease     Musculoskeletal  (+) Arthritis ,   Abdominal   Peds  Hematology  (+) anemia ,   Anesthesia Other Findings   Reproductive/Obstetrics                           Anesthesia Physical Anesthesia Plan  ASA: 4  Anesthesia Plan: General   Post-op Pain Management:    Induction: Intravenous  PONV Risk Score and Plan: Propofol infusion  Airway Management Planned: Nasal Cannula and Natural Airway  Additional Equipment:   Intra-op Plan:   Post-operative Plan: Possible Post-op  intubation/ventilation  Informed Consent: I have reviewed the patients History and Physical, chart, labs and discussed the procedure including the risks, benefits and alternatives for the proposed anesthesia with the patient or authorized representative who has indicated his/her understanding and acceptance.     Dental advisory given  Plan Discussed with: CRNA and Surgeon  Anesthesia Plan Comments:        Anesthesia Quick Evaluation

## 2021-05-10 NOTE — Progress Notes (Signed)
CRITICAL VALUE STICKER  CRITICAL VALUE:  hgb 5.2  RECEIVER (on-site recipient of call):  A. Ouida Sills, RN  DATE & TIME NOTIFIED: 05/09/2021 at 83  MD NOTIFIED:  Billey Co, PA-C  RESPONSE:  Pt advised to report to the ED for further evaluation and work up

## 2021-05-11 DIAGNOSIS — K922 Gastrointestinal hemorrhage, unspecified: Secondary | ICD-10-CM

## 2021-05-11 DIAGNOSIS — E7800 Pure hypercholesterolemia, unspecified: Secondary | ICD-10-CM

## 2021-05-11 DIAGNOSIS — C3491 Malignant neoplasm of unspecified part of right bronchus or lung: Secondary | ICD-10-CM

## 2021-05-11 DIAGNOSIS — N1832 Chronic kidney disease, stage 3b: Secondary | ICD-10-CM

## 2021-05-11 DIAGNOSIS — D62 Acute posthemorrhagic anemia: Secondary | ICD-10-CM

## 2021-05-11 DIAGNOSIS — I10 Essential (primary) hypertension: Secondary | ICD-10-CM

## 2021-05-11 LAB — CBC
HCT: 28.9 % — ABNORMAL LOW (ref 39.0–52.0)
Hemoglobin: 8.1 g/dL — ABNORMAL LOW (ref 13.0–17.0)
MCH: 20.7 pg — ABNORMAL LOW (ref 26.0–34.0)
MCHC: 28 g/dL — ABNORMAL LOW (ref 30.0–36.0)
MCV: 73.7 fL — ABNORMAL LOW (ref 80.0–100.0)
Platelets: 265 10*3/uL (ref 150–400)
RBC: 3.92 MIL/uL — ABNORMAL LOW (ref 4.22–5.81)
RDW: 26.4 % — ABNORMAL HIGH (ref 11.5–15.5)
WBC: 7.7 10*3/uL (ref 4.0–10.5)
nRBC: 0 % (ref 0.0–0.2)

## 2021-05-11 MED ORDER — NIFEREX PO TABS: 1.0000 | ORAL_TABLET | Freq: Every day | ORAL | 2 refills | Status: DC

## 2021-05-11 MED ORDER — SODIUM CHLORIDE 0.9 % IV SOLN
500.0000 mg | Freq: Once | INTRAVENOUS | Status: DC
  Filled 2021-05-11: qty 25

## 2021-05-11 MED ORDER — ASPIRIN EC 81 MG PO TBEC: 81.0000 mg | DELAYED_RELEASE_TABLET | Freq: Every morning | ORAL | Status: DC

## 2021-05-11 MED ORDER — PANTOPRAZOLE SODIUM 40 MG PO TBEC: 40.0000 mg | DELAYED_RELEASE_TABLET | Freq: Two times a day (BID) | ORAL | 1 refills | Status: DC

## 2021-05-11 NOTE — Progress Notes (Signed)
Subjective:  Patient has no complaints.  He denies chest pain shortness of breath or abdominal pain.  He says he feels much better and wants to go home as soon as possible.  He does not want to proceed with iron infusion.  He tells me that he will get it at oncology clinic.  Current Medications:  Current Facility-Administered Medications:    acetaminophen (TYLENOL) tablet 650 mg, 650 mg, Oral, Q6H PRN **OR** acetaminophen (TYLENOL) suppository 650 mg, 650 mg, Rectal, Q6H PRN, Rourk, Cristopher Estimable, MD   amLODipine (NORVASC) tablet 5 mg, 5 mg, Oral, Daily, Rourk, Cristopher Estimable, MD, 5 mg at 05/10/21 2130   atorvastatin (LIPITOR) tablet 10 mg, 10 mg, Oral, Q supper, Rourk, Cristopher Estimable, MD, 10 mg at 05/10/21 1648   carvedilol (COREG) tablet 12.5 mg, 12.5 mg, Oral, BID WC, Rourk, Cristopher Estimable, MD, 12.5 mg at 05/10/21 1649   Chlorhexidine Gluconate Cloth 2 % PADS 6 each, 6 each, Topical, Daily, Rourk, Cristopher Estimable, MD, 6 each at 05/10/21 0816   feeding supplement (BOOST / RESOURCE BREEZE) liquid 1 Container, 1 Container, Oral, TID BM, Rourk, Cristopher Estimable, MD, 1 Container at 05/10/21 2127   iron sucrose (VENOFER) 500 mg in sodium chloride 0.9 % 250 mL IVPB, 500 mg, Intravenous, Once, Barton Dubois, MD   ondansetron [facility]) tablet 4 mg, 4 mg, Oral, Q6H PRN **OR** ondansetron (ZOFRAN) injection 4 mg, 4 mg, Intravenous, Q6H PRN, Rourk, Cristopher Estimable, MD   pantoprazole (PROTONIX) injection 40 mg, 40 mg, Intravenous, Q12H, Rourk, Cristopher Estimable, MD, 40 mg at 05/10/21 2127   Objective: Blood pressure (!) 154/65, pulse (!) 152, temperature 98 F (36.7 C), temperature source Oral, resp. rate 12, height _0  (1.702 m), weight 58.6 kg, SpO2 100 %. Patient is alert and in no acute distress. Abdomen is symmetrical soft and nontender with organomegaly or masses.  Labs/studies Results:   CBC Latest Ref Rng & Units 05/11/2021 05/10/2021 05/09/2021  WBC 4.0 - 10.5 K/uL 7.7 9.4 6.5  Hemoglobin 13.0 - 17.0 g/dL 8.1(L) 8.0(L) 4.9(LL)  Hematocrit  39.0 - 52.0 % 28.9(L) 28.1(L) 19.7(L)  Platelets 150 - 400 K/uL 265 266 299    CMP Latest Ref Rng & Units 05/10/2021 05/09/2021 05/09/2021  Glucose 70 - 99 mg/dL 94 116(H) 125(H)  BUN 8 - 23 mg/dL _1 Creatinine 0.61 - 1.24 mg/dL 1.17 1.15 1.25(H)  Sodium 135 - 145 mmol/L 138 135 137  Potassium 3.5 - 5.1 mmol/L 4.1 4.5 4.2  Chloride 98 - 111 mmol/L 107 106 107  CO2 22 - 32 mmol/L _2 Calcium 8.9 - 10.3 mg/dL 9.0 8.7(L) 9.2  Total Protein 6.5 - 8.1 g/dL 6.1(L) 6.2(L) 6.3(L)  Total Bilirubin 0.3 - 1.2 mg/dL 1.1 0.4 0.5  Alkaline Phos 38 - 126 U/L 77 76 80  AST 15 - 41 U/L _3 ALT 0 - 44 U/L _4 Hepatic Function Latest Ref Rng & Units 05/10/2021 05/09/2021 05/09/2021  Total Protein 6.5 - 8.1 g/dL 6.1(L) 6.2(L) 6.3(L)  Albumin 3.5 - 5.0 g/dL 3.4(L) 3.2(L) 3.3(L)  AST 15 - 41 U/L _5 ALT 0 - 44 U/L _6 Alk Phosphatase 38 - 126 U/L 77 76 80  Total Bilirubin 0.3 - 1.2 mg/dL 1.1 0.4 0.5     Assessment:  #1.  Acute on chronic iron deficiency anemia.  Patient has received 2 units of PRBCs and his hemoglobin is almost doubled.  He underwent therapeutic EGD with push enteroscopy by Dr. Gala Romney yesterday with ablation of gastroduodenal AV malformations.  Gastric AV malformation was actively bleeding.  Patient is hemodynamically stable.   #2.  History of squamous cell carcinoma of the lung.  He is scheduled to start radiation next week.  Recommendations  Can resume low-dose aspirin in 1 week. Will DC iron infusion as patient refuses.  He has been getting iron infusion at oncology clinic which she can continue. Discussed with Dr. Dyann Kief.  DC plans per him. Follow-up GI on as-needed basis.

## 2021-05-11 NOTE — Progress Notes (Signed)
Pt refused all morning meds, stated "I am ready to go home, if I wanted to lay around I would lay around at home. I got bills to pay and animals to feed. I didn't expect to come here and have all this done." Explained importance of all medications yet still refused. Also explained to patient that an iron infusion was on his chart to receive, stated he wanted to talk to the doctor before potentially getting that. Also refused his breakfast as he wants "real food." MD made aware. Will continue to monitor.

## 2021-05-11 NOTE — Discharge Summary (Signed)
Physician Discharge Summary  Jared Schmidt BTD:[id number] DOB: 1936/02/21 DOA: 05/09/2021  PCP: Noreene Larsson, NP  Admit date: 05/09/2021 Discharge date: 05/11/2021  Time spent: 35 minutes  Recommendations for Outpatient Follow-up:  Repeat CBC to follow hemoglobin trend. Repeat basic metabolic panel to follow electrolytes and renal function.   Discharge Diagnoses:  Principal Problem:   UGI bleed Active Problems:   Hypertension   Hypercholesteremia   Iron deficiency anemia due to chronic blood loss   Chronic renal failure, stage 3b (HCC)   Squamous cell lung cancer, right (HCC)   Acute blood loss anemia   Discharge Condition: Stable and improved.  Discharged home with instruction to follow-up with PCP and hem-onc as an outpatient.  CODE STATUS: Full code.  Diet recommendation: Heart healthy diet.  Filed Weights   05/09/21 1729 05/09/21 2149  Weight: 62.6 kg 58.6 kg    History of present illness:  As per H&P written by Dr. Olevia Bowens on 05/09/2021 Jared Schmidt is a 85 y.o. male with medical history significant of microcytic anemia in the setting of chronic blood loss, osteoarthritis, GERD, hyperlipidemia, hypertension, history of TIA, history of bleeding PUD in 2019 who is coming to the emergency department after his most recent blood work showed a hemoglobin of 4.9 g/dL.  The patient stated that he has missed the last couple iron IV infusions.  He has noticed t several episodes of melena.  He has been weak, dyspneic and lightheaded at times.  However, he denied abdominal pain, nausea, emesis, diarrhea, constipation or hematochezia.  No dysuria, flank pain, frequency or hematuria.  Denied fever, chills, rhinorrhea, sore throat, wheezing, chest pain, palpitations, diaphoresis, PND, orthopnea or pitting edema of the lower extremities.  Denies polyuria, polydipsia, polyphagia or blurred vision.   ED Course: Initial vital signs were temperature 97.7 F, pulse 83, respiration 20, BP 145/68  mmHg O2 sat 99% on room air.  A 2 unit blood transfusion was started in the emergency department.  Dr. Laural Golden was contacted who asked for the patient to be on clear liquids and then nothing after breakfast.  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 -Status post endoscopic evaluation and ablation -Hemoglobin Stabilized after transfusion -At discharge hemoglobin 8.1. -Please repeat CBC to follow hemoglobin trend -Patient declined inpatient IV iron infusion; okay to take Niferex daily.  Resume follow-up with hematology oncology for IV iron infusions as an outpatient. -Following GI recommendations stop baby aspirin for 10 days and continue the use of PPI. -Avoid the use of NSAIDs over-the-counter.  Hypertension -Blood pressure stable and well-controlled -Continue heart healthy diet -Continue amlodipine and carvedilol.  Hyperlipidemia -Continue statins.  Squamous cell lung cancer -Continue outpatient follow-up with oncology service.  Chronic kidney disease a stage IIIb (currently in transition point towards a stage IV). -Overall stable and at baseline -Maintain adequate hydration -Repeat basic metabolic panel to follow renal function trend.   Procedures: See below for x-ray reports. Endoscopy with push enteroscopy on 05/10/2021: Demonstrating gastroduodenal AV malformation  Consultations: Gastroenterology service  Discharge Exam: Vitals:   05/11/21 0900 05/11/21 1000  BP:    Pulse: (!) 152 99  Resp: 12 (!) 24  Temp:    SpO2: 100% 100%    General: Afebrile, no chest pain, no nausea, no vomiting, feeling ready to go home.  No overt bleeding reported.  Tolerating diet. Cardiovascular: Rate controlled, no rubs, no gallops, no  JVD. Respiratory: Clear to auscultation bilaterally; no using accessory muscle. Abdomen: Soft, nontender, nondistended,  positive bowel sounds Extremities: No cyanosis or clubbing.  Discharge Instructions   Discharge Instructions     Diet - low sodium heart healthy   Complete by: As directed    Discharge instructions   Complete by: As directed    Arrange follow-up with PCP in 10 days Follow-up with oncology service as previously instructed Take medications as prescribed Avoid the use of any over-the-counter NSAIDs and hold the use of baby aspirin until 05/22/2019 Maintain adequate hydration.      Allergies as of 05/11/2021   No Known Allergies      Medication List     TAKE these medications    acetaminophen 500 MG tablet Commonly known as: TYLENOL Take 500 mg by mouth every 6 (six) hours as needed for moderate pain or mild pain.   amLODipine 5 MG tablet Commonly known as: NORVASC Take 1 tablet (5 mg total) by mouth daily. What changed: when to take this   aspirin EC 81 MG tablet Take 1 tablet (81 mg total) by mouth every morning. Swallow whole. Start taking on: May 21, 2021 What changed: These instructions start on May 21, 2021. If you are unsure what to do until then, ask your doctor or other care provider.   atorvastatin 10 MG tablet Commonly known as: LIPITOR Take 1 tablet (10 mg total) by mouth daily with supper.   carvedilol 12.5 MG tablet Commonly known as: COREG TAKE 1 TABLET BY MOUTH TWICE DAILY with a meal What changed: when to take this   multivitamin with minerals tablet Take 1 tablet by mouth every morning.   Niferex Tabs Take 1 tablet by mouth daily.   pantoprazole 40 MG tablet Commonly known as: PROTONIX Take 1 tablet (40 mg total) by mouth 2 (two) times daily. What changed: when to take this   vitamin C 500 MG tablet Commonly known as: ASCORBIC ACID Take 500 mg by mouth every morning.   vitamin E 45 MG (100 UNITS) capsule Take 100 Units by mouth every morning.       No Known Allergies  Follow-up Information     Noreene Larsson, NP. Schedule an  appointment as soon as possible for a visit in 10 day(s).   Specialty: Nurse Practitioner Contact information: [address] [place] [address] [phone]                 The results of significant diagnostics from this hospitalization (including imaging, microbiology, ancillary and laboratory) are listed below for reference.    Significant Diagnostic Studies: DG Chest 2 View  Result Date: 04/24/2021 CLINICAL DATA:  Known right lung cancer.  Left-sided pain. EXAM: CHEST - 2 VIEW COMPARISON:  Mar 07, 2021 FINDINGS: The patient has known right perihilar lung cancer is stable on today's imaging. No pneumothorax. The heart, hila, and mediastinum are unchanged. No pulmonary nodules or masses otherwise seen. No new infiltrate. IMPRESSION: 1. The patient's known right-sided lung cancer is stable based on this chest x-ray. 2. No cause for left-sided pain identified. Specifically, no pneumothorax or infiltrate. Electronically Signed   By: Dorise Bullion III M.D   On: 04/24/2021 17:22    Microbiology: Recent Results (from the past 240 hour(s))  Resp Panel by RT-PCR (Flu A&B, Covid) Nasopharyngeal Swab     Status: None   Collection Time: 05/09/21  7:50 PM   Specimen: Nasopharyngeal Swab; Nasopharyngeal(NP) swabs in  vial transport medium  Result Value Ref Range Status   SARS Coronavirus 2 by RT PCR NEGATIVE NEGATIVE Final    Comment: (NOTE) SARS-CoV-2 target nucleic acids are NOT DETECTED.  The SARS-CoV-2 RNA is generally detectable in upper respiratory specimens during the acute phase of infection. The lowest concentration of SARS-CoV-2 viral copies this assay can detect is 138 copies/mL. A negative result does not preclude SARS-Cov-2 infection and should not be used as the sole basis for treatment or other patient management decisions. A negative result may occur with  improper specimen collection/handling, submission of specimen other than nasopharyngeal swab,  presence of viral mutation(s) within the areas targeted by this assay, and inadequate number of viral copies(<138 copies/mL). A negative result must be combined with clinical observations, patient history, and epidemiological information. The expected result is Negative.  Fact Sheet for Patients:  EntrepreneurPulse.com.au  Fact Sheet for Healthcare Providers:  IncredibleEmployment.be  This test is no t yet approved or cleared by the [place] FDA and  has been authorized for detection and/or diagnosis of SARS-CoV-2 by FDA under an Emergency Use Authorization (EUA). This EUA will remain  in effect (meaning this test can be used) for the duration of the COVID-19 declaration under Section 564(b)(1) of the Act, 21 U.S.C.section 360bbb-3(b)(1), unless the authorization is terminated  or revoked sooner.       Influenza A by PCR NEGATIVE NEGATIVE Final   Influenza B by PCR NEGATIVE NEGATIVE Final    Comment: (NOTE) The Xpert Xpress SARS-CoV-2/FLU/RSV plus assay is intended as an aid in the diagnosis of influenza from Nasopharyngeal swab specimens and should not be used as a sole basis for treatment. Nasal washings and aspirates are unacceptable for Xpert Xpress SARS-CoV-2/FLU/RSV testing.  Fact Sheet for Patients: EntrepreneurPulse.com.au  Fact Sheet for Healthcare Providers: IncredibleEmployment.be  This test is not yet approved or cleared by the [place] FDA and has been authorized for detection and/or diagnosis of SARS-CoV-2 by FDA under an Emergency Use Authorization (EUA). This EUA will remain in effect (meaning this test can be used) for the duration of the COVID-19 declaration under Section 564(b)(1) of the Act, 21 U.S.C. section 360bbb-3(b)(1), unless the authorization is terminated or revoked.  Performed at [facility], [address] [place], [place] [address]   MRSA Next Gen by PCR,  Nasal     Status: None   Collection Time: 05/09/21  9:36 PM   Specimen: Nasal Mucosa; Nasal Swab  Result Value Ref Range Status   MRSA by PCR Next Gen NOT DETECTED NOT DETECTED Final    Comment: (NOTE) The GeneXpert MRSA Assay (FDA approved for NASAL specimens only), is one component of a comprehensive MRSA colonization surveillance program. It is not intended to diagnose MRSA infection nor to guide or monitor treatment for MRSA infections. Test performance is not FDA approved in patients less than 13 years old. Performed at [facility], [address] [place], [place] [address]      Labs: Basic Metabolic Panel: Recent Labs  Lab 05/09/21 1411 05/09/21 2038 05/10/21 0315  NA 137 135 138  K 4.2 4.5 4.1  CL 107 106 107  CO2 22 23 24   GLUCOSE 125* 116* 94  BUN 23 23 21   CREATININE 1.25* 1.15 1.17  CALCIUM 9.2 8.7* 9.0   Liver Function Tests: Recent Labs  Lab 05/09/21 1411 05/09/21 2038 05/10/21 0315  AST 19 22 18   ALT 11 12 11   ALKPHOS 80 76 77  BILITOT 0.5 0.4 1.1  PROT 6.3* 6.2* 6.1*  ALBUMIN 3.3* 3.2* 3.4*   CBC: Recent Labs  Lab 05/09/21 1411 05/09/21 1755 05/10/21 0315 05/11/21 0518  WBC 7.2 6.5 9.4 7.7  NEUTROABS 5.2  --   --   --   HGB 5.2* 4.9* 8.0* 8.1*  HCT 20.3* 19.7* 28.1* 28.9*  MCV 67.9* 67.7* 73.4* 73.7*  PLT 318 299 266 265    Signed:  Barton Dubois MD.  [facility] 05/11/2021, 11:06 AM

## 2021-05-11 NOTE — Progress Notes (Signed)
Nsg Discharge Note  Admit Date:  05/09/2021 Discharge date: 05/11/2021   Jared Schmidt to be D/C'd Home per MD order.  AVS completed.  Copy for chart, and copy for patient signed, and dated. Patient/caregiver able to verbalize understanding.  Discharge Medication: Allergies as of 05/11/2021   No Known Allergies      Medication List     TAKE these medications    acetaminophen 500 MG tablet Commonly known as: TYLENOL Take 500 mg by mouth every 6 (six) hours as needed for moderate pain or mild pain.   amLODipine 5 MG tablet Commonly known as: NORVASC Take 1 tablet (5 mg total) by mouth daily. What changed: when to take this   aspirin EC 81 MG tablet Take 1 tablet (81 mg total) by mouth every morning. Swallow whole. Start taking on: May 21, 2021 What changed: These instructions start on May 21, 2021. If you are unsure what to do until then, ask your doctor or other care provider.   atorvastatin 10 MG tablet Commonly known as: LIPITOR Take 1 tablet (10 mg total) by mouth daily with supper.   carvedilol 12.5 MG tablet Commonly known as: COREG TAKE 1 TABLET BY MOUTH TWICE DAILY with a meal What changed: when to take this   multivitamin with minerals tablet Take 1 tablet by mouth every morning.   Niferex Tabs Take 1 tablet by mouth daily.   pantoprazole 40 MG tablet Commonly known as: PROTONIX Take 1 tablet (40 mg total) by mouth 2 (two) times daily. What changed: when to take this   vitamin C 500 MG tablet Commonly known as: ASCORBIC ACID Take 500 mg by mouth every morning.   vitamin E 45 MG (100 UNITS) capsule Take 100 Units by mouth every morning.        Discharge Assessment: Vitals:   05/11/21 0900 05/11/21 1000  BP:    Pulse: (!) 152 99  Resp: 12 (!) 24  Temp:    SpO2: 100% 100%   Skin clean, dry and intact without evidence of skin break down, no evidence of skin tears noted. IV catheter discontinued intact. Site without signs and symptoms of  complications - no redness or edema noted at insertion site, patient denies c/o pain - only slight tenderness at site.  Dressing with slight pressure applied.  D/c Instructions-Education: Discharge instructions given to patient/family with verbalized understanding. D/c education completed with patient/family including follow up instructions, medication list, d/c activities limitations if indicated, with other d/c instructions as indicated by MD - patient able to verbalize understanding, all questions fully answered. Patient instructed to return to ED, call 911, or call MD for any changes in condition.  Patient escorted via [place], and D/C home via private auto.  Carney Corners, RN 05/11/2021 11:11 AM

## 2021-05-11 NOTE — Progress Notes (Signed)
Dr. Laural Golden spoke with patient. Patient refused iron infusion. Dr. Laural Golden stated to just cancel it. Primary RN made aware. Dr. Laural Golden to follow-up with Dr. Dyann Kief about patient.

## 2021-05-14 ENCOUNTER — Telehealth: Payer: Self-pay

## 2021-05-14 ENCOUNTER — Encounter (HOSPITAL_COMMUNITY): Payer: Self-pay | Admitting: Internal Medicine

## 2021-05-14 NOTE — Telephone Encounter (Signed)
Transition Care Management Follow-up Telephone Call Date of discharge and from where: 05/11/2021 from [facility]  How have you been since you were released from the hospital? Pt has been well.  Any questions or concerns? No  Items Reviewed: Did the pt receive and understand the discharge instructions provided? Yes  Medications obtained and verified? Yes  Other? No  Any new allergies since your discharge? Yes  Dietary orders reviewed? Yes Do you have support at home? Yes   Home Care and Equipment/Supplies: Were home health services ordered? no If so, what is the name of the agency? N/A   Has the agency set up a time to come to the patient's home? no Were any new equipment or medical supplies ordered?  No What is the name of the medical supply agency? N/A  Were you able to get the supplies/equipment? no Do you have any questions related to the use of the equipment or supplies? No  Functional Questionnaire: (I = Independent and D = Dependent) ADLs: I   Bathing/Dressing- I  Meal Prep- I  Eating- I  Maintaining continence- I  Transferring/Ambulation- I  Managing Meds- I  Follow up appointments reviewed:  PCP Hospital f/u appt confirmed? Yes  Scheduled to see Donneta Romberg, AGNP-C on 06/05/21 @ 2:20p. [facility] f/u appt confirmed? Yes  Scheduled to see Hematology (infusion) on 05/15/21 @ 2:00p. Are transportation arrangements needed? No  If their condition worsens, is the pt aware to call PCP or go to the Emergency Dept.? Yes Was the patient provided with contact information for the PCP's office or ED? Yes Was to pt encouraged to call back with questions or concerns? Yes

## 2021-05-14 NOTE — Telephone Encounter (Signed)
error 

## 2021-05-15 ENCOUNTER — Ambulatory Visit (HOSPITAL_COMMUNITY): Payer: PPO | Admitting: Hematology and Oncology

## 2021-05-15 ENCOUNTER — Ambulatory Visit: Payer: PPO

## 2021-05-15 ENCOUNTER — Encounter (HOSPITAL_COMMUNITY): Payer: Self-pay

## 2021-05-15 ENCOUNTER — Other Ambulatory Visit: Payer: Self-pay

## 2021-05-15 ENCOUNTER — Inpatient Hospital Stay (HOSPITAL_COMMUNITY): Payer: PPO

## 2021-05-15 VITALS — BP 126/51 | HR 83 | Temp 96.8°F | Resp 18

## 2021-05-15 DIAGNOSIS — I10 Essential (primary) hypertension: Secondary | ICD-10-CM | POA: Diagnosis not present

## 2021-05-15 DIAGNOSIS — C3411 Malignant neoplasm of upper lobe, right bronchus or lung: Secondary | ICD-10-CM | POA: Diagnosis not present

## 2021-05-15 DIAGNOSIS — D500 Iron deficiency anemia secondary to blood loss (chronic): Secondary | ICD-10-CM

## 2021-05-15 DIAGNOSIS — K922 Gastrointestinal hemorrhage, unspecified: Secondary | ICD-10-CM | POA: Diagnosis not present

## 2021-05-15 DIAGNOSIS — Z7982 Long term (current) use of aspirin: Secondary | ICD-10-CM | POA: Diagnosis not present

## 2021-05-15 DIAGNOSIS — Z79899 Other long term (current) drug therapy: Secondary | ICD-10-CM | POA: Diagnosis not present

## 2021-05-15 DIAGNOSIS — Z8673 Personal history of transient ischemic attack (TIA), and cerebral infarction without residual deficits: Secondary | ICD-10-CM | POA: Diagnosis not present

## 2021-05-15 DIAGNOSIS — E539 Vitamin B deficiency, unspecified: Secondary | ICD-10-CM | POA: Diagnosis not present

## 2021-05-15 MED ORDER — SODIUM CHLORIDE 0.9 % IV SOLN
510.0000 mg | Freq: Once | INTRAVENOUS | Status: AC
  Administered 2021-05-15: 510 mg via INTRAVENOUS
  Filled 2021-05-15: qty 510

## 2021-05-15 MED ORDER — SODIUM CHLORIDE 0.9 % IV SOLN: INTRAVENOUS | Status: DC

## 2021-05-15 NOTE — Patient Instructions (Signed)
[facility]  Discharge Instructions: Thank you for choosing [facility] to provide your oncology and hematology care.  If you have a lab appointment with the [facility], please come in thru the Main Entrance and check in at the main information desk.  Wear comfortable clothing and clothing appropriate for easy access to any Portacath or PICC line.   We strive to give you quality time with your provider. You may need to reschedule your appointment if you arrive late (15 or more minutes).  Arriving late affects you and other patients whose appointments are after yours.  Also, if you miss three or more appointments without notifying the office, you may be dismissed from the clinic at the provider's discretion.      For prescription refill requests, have your pharmacy contact our office and allow 72 hours for refills to be completed.    Today you received the following :  Feraheme.        BELOW ARE SYMPTOMS THAT SHOULD BE REPORTED IMMEDIATELY: *FEVER GREATER THAN 100.4 F (38 C) OR HIGHER *CHILLS OR SWEATING *NAUSEA AND VOMITING THAT IS NOT CONTROLLED WITH YOUR NAUSEA MEDICATION *UNUSUAL SHORTNESS OF BREATH *UNUSUAL BRUISING OR BLEEDING *URINARY PROBLEMS (pain or burning when urinating, or frequent urination) *BOWEL PROBLEMS (unusual diarrhea, constipation, pain near the anus) TENDERNESS IN MOUTH AND THROAT WITH OR WITHOUT PRESENCE OF ULCERS (sore throat, sores in mouth, or a toothache) UNUSUAL RASH, SWELLING OR PAIN  UNUSUAL VAGINAL DISCHARGE OR ITCHING   Items with * indicate a potential emergency and should be followed up as soon as possible or go to the Emergency Department if any problems should occur.  Please show the CHEMOTHERAPY ALERT CARD or IMMUNOTHERAPY ALERT CARD at check-in to the Emergency Department and triage nurse.  Should you have questions after your visit or need to cancel or reschedule your appointment, please contact [facility] [phone]  and follow the prompts.  Office hours are 8:00 a.m. to 4:30 p.m. Monday - Friday. Please note that voicemails left after 4:00 p.m. may not be returned until the following business day.  We are closed weekends and major holidays. You have access to a nurse at all times for urgent questions. Please call the main number to the clinic [phone] and follow the prompts.  For any non-urgent questions, you may also contact your provider using MyChart. We now offer e-Visits for anyone 59 and older to request care online for non-urgent symptoms. For details visit [email or web address].   Also download the MyChart app! Go to the app store, search "MyChart", open the app, select [facility], and log in with your MyChart username and password.  Due to Covid, a mask is required upon entering the hospital/clinic. If you do not have a mask, one will be given to you upon arrival. For doctor visits, patients may have 1 support person aged 26 or older with them. For treatment visits, patients cannot have anyone with them due to current Covid guidelines and our immunocompromised population.

## 2021-05-15 NOTE — Progress Notes (Signed)
Patient presents today for Feraheme infusion. MAR reviewed and updated. Vital signs stable. Patient has no complaints today.  Patient states he recently was hospitalized here at [facility] to  ICU from  05/09/21 to 05/11/21.  Patient's wife at the bedside states patient's HGB on arrival to the ER was 5.2 then dropped to 4.9 and patient underwent surgery by Dr. Gala Romney. Patient states he has fatigue intermittent that is relieved by a nap. Patient denies any shortness of breath, dizziness, or pain today.   Feraheme given today per MD orders. Tolerated infusion without adverse affects. Vital signs stable. No complaints at this time. Discharged from clinic ambulatory in stable condition. Alert and oriented x 3. F/U with [facility] as scheduled.

## 2021-05-16 ENCOUNTER — Ambulatory Visit: Payer: PPO

## 2021-05-17 ENCOUNTER — Ambulatory Visit: Payer: PPO

## 2021-05-18 ENCOUNTER — Ambulatory Visit: Payer: PPO

## 2021-05-22 ENCOUNTER — Ambulatory Visit: Payer: PPO

## 2021-05-23 ENCOUNTER — Other Ambulatory Visit: Payer: Self-pay

## 2021-05-23 ENCOUNTER — Inpatient Hospital Stay (HOSPITAL_BASED_OUTPATIENT_CLINIC_OR_DEPARTMENT_OTHER): Payer: PPO | Admitting: Hematology and Oncology

## 2021-05-23 ENCOUNTER — Encounter (HOSPITAL_COMMUNITY): Payer: Self-pay | Admitting: Hematology and Oncology

## 2021-05-23 ENCOUNTER — Inpatient Hospital Stay (HOSPITAL_COMMUNITY): Payer: PPO

## 2021-05-23 VITALS — BP 164/77 | HR 78 | Temp 96.9°F | Resp 16

## 2021-05-23 DIAGNOSIS — D500 Iron deficiency anemia secondary to blood loss (chronic): Secondary | ICD-10-CM

## 2021-05-23 DIAGNOSIS — C3491 Malignant neoplasm of unspecified part of right bronchus or lung: Secondary | ICD-10-CM

## 2021-05-23 MED ORDER — FERUMOXYTOL INJECTION 510 MG/17 ML
510.0000 mg | Freq: Once | INTRAVENOUS | Status: AC
  Administered 2021-05-23: 510 mg via INTRAVENOUS
  Filled 2021-05-23: qty 510

## 2021-05-23 MED ORDER — SODIUM CHLORIDE 0.9 % IV SOLN: INTRAVENOUS | Status: DC

## 2021-05-23 NOTE — Assessment & Plan Note (Signed)
He had recent severe iron deficiency anemia secondary to GI bleed He resumed taking aspirin 2 days ago without signs of bleeding He denies recent pica He tolerated IV iron well He will get another dose of IV iron today I recommend return visit in 1 month for repeat blood work and further IV iron if needed

## 2021-05-23 NOTE — Patient Instructions (Signed)
[facility]  Discharge Instructions: Thank you for choosing [facility] to provide your oncology and hematology care.  If you have a lab appointment with the [facility], please come in thru the Main Entrance and check in at the main information desk.  Wear comfortable clothing and clothing appropriate for easy access to any Portacath or PICC line.   We strive to give you quality time with your provider. You may need to reschedule your appointment if you arrive late (15 or more minutes).  Arriving late affects you and other patients whose appointments are after yours.  Also, if you miss three or more appointments without notifying the office, you may be dismissed from the clinic at the provider's discretion.      For prescription refill requests, have your pharmacy contact our office and allow 72 hours for refills to be completed.    Today you received the following: Iron, return as scheduled.   To help prevent nausea and vomiting after your treatment, we encourage you to take your nausea medication as directed.  BELOW ARE SYMPTOMS THAT SHOULD BE REPORTED IMMEDIATELY: *FEVER GREATER THAN 100.4 F (38 C) OR HIGHER *CHILLS OR SWEATING *NAUSEA AND VOMITING THAT IS NOT CONTROLLED WITH YOUR NAUSEA MEDICATION *UNUSUAL SHORTNESS OF BREATH *UNUSUAL BRUISING OR BLEEDING *URINARY PROBLEMS (pain or burning when urinating, or frequent urination) *BOWEL PROBLEMS (unusual diarrhea, constipation, pain near the anus) TENDERNESS IN MOUTH AND THROAT WITH OR WITHOUT PRESENCE OF ULCERS (sore throat, sores in mouth, or a toothache) UNUSUAL RASH, SWELLING OR PAIN  UNUSUAL VAGINAL DISCHARGE OR ITCHING   Items with * indicate a potential emergency and should be followed up as soon as possible or go to the Emergency Department if any problems should occur.  Please show the CHEMOTHERAPY ALERT CARD or IMMUNOTHERAPY ALERT CARD at check-in to the Emergency Department and triage  nurse.  Should you have questions after your visit or need to cancel or reschedule your appointment, please contact [facility] [phone]  and follow the prompts.  Office hours are 8:00 a.m. to 4:30 p.m. Monday - Friday. Please note that voicemails left after 4:00 p.m. may not be returned until the following business day.  We are closed weekends and major holidays. You have access to a nurse at all times for urgent questions. Please call the main number to the clinic [phone] and follow the prompts.  For any non-urgent questions, you may also contact your provider using MyChart. We now offer e-Visits for anyone 14 and older to request care online for non-urgent symptoms. For details visit [email or web address].   Also download the MyChart app! Go to the app store, search "MyChart", open the app, select [facility], and log in with your MyChart username and password.  Due to Covid, a mask is required upon entering the hospital/clinic. If you do not have a mask, one will be given to you upon arrival. For doctor visits, patients may have 1 support person aged 57 or older with them. For treatment visits, patients cannot have anyone with them due to current Covid guidelines and our immunocompromised population.

## 2021-05-23 NOTE — Assessment & Plan Note (Signed)
His treatment was placed on hold due to recent hospitalization from severe iron deficiency anemia from blood loss I encouraged the patient and his wife to call radiation oncologist to set up radiation treatment to start as soon as possible, possibly as early as first week of August He will return again next month for further follow-up

## 2021-05-23 NOTE — Progress Notes (Signed)
[facility] progress notes  Patient Care Team: Noreene Larsson, NP as PCP - General (Nurse Practitioner) Danie Binder, MD (Inactive) as Consulting Physician (Gastroenterology)  CHIEF COMPLAINTS/PURPOSE OF VISIT:  Lung cancer and severe iron deficiency anemia secondary to GI bleed  HISTORY OF PRESENTING ILLNESS:  Jared Schmidt 85 y.o. male is seen today because his primary oncologist is not available His wife is present The patient was recently diagnosed with right upper lobe lung cancer He was seen by radiation oncologist and is supposed to start radiation therapy this month He developed significant GI bleed with severe anemia and was hospitalized The patient's wife canceled his radiation appointment to focus on him getting better from the recent blood loss When he was diagnosed with iron deficiency anemia, he denies symptoms of chest pain, shortness of breath or dizziness He did develop pica with excessive chewing of ice and craving for cornstarch use The patient denies any recent signs or symptoms of bleeding such as spontaneous epistaxis, hematuria or hematochezia. He has recent nonspecific cough He has significant nonspecific chest discomfort after recent biopsy  MEDICAL HISTORY:  Past Medical History:  Diagnosis Date   Anemia    Arthritis    GERD (gastroesophageal reflux disease)    Hypercholesteremia    Hypertension    Iron deficiency anemia due to chronic blood loss 03/17/2018   Iron deficiency anemia due to chronic blood loss 03/17/2018   Stroke ([place])    TIA 2020   Symptomatic anemia 01/20/2018   Vitamin B deficiency    patient denies    SURGICAL HISTORY: Past Surgical History:  Procedure Laterality Date   BIOPSY  06/24/2018   Procedure: BIOPSY;  Surgeon: Daneil Dolin, MD;  [facility];  Service: Endoscopy;;  gastric    COLONOSCOPY N/A 06/24/2018   Dr. Gala Romney: Internal hemorrhoids, diverticulosis, 9 mm polyp removed from the  sigmoid colon which was hyperplastic.   ENTEROSCOPY N/A 05/10/2021   Procedure: ENTEROSCOPY;  Surgeon: Daneil Dolin, MD;  [facility];  Service: Endoscopy;  Laterality: N/A;   ESOPHAGOGASTRODUODENOSCOPY N/A 06/24/2018   Dr. Gala Romney: Multiple erosions in the stomach and duodenal bulb, gastric biopsies with chronic gastritis with intestinal metaplasia, no H. pylori.   ESOPHAGOGASTRODUODENOSCOPY (EGD) WITH PROPOFOL N/A 09/17/2018   Procedure: ESOPHAGOGASTRODUODENOSCOPY (EGD) WITH PROPOFOL;  Surgeon: Daneil Dolin, MD;  [facility];  Service: Endoscopy;  Laterality: N/A;  11:00am   ESOPHAGOGASTRODUODENOSCOPY (EGD) WITH PROPOFOL N/A 05/10/2021   Procedure: ESOPHAGOGASTRODUODENOSCOPY (EGD) WITH PROPOFOL;  Surgeon: Daneil Dolin, MD;  [facility];  Service: Endoscopy;  Laterality: N/A;   GIVENS CAPSULE STUDY N/A 09/15/2018   Procedure: GIVENS CAPSULE STUDY;  Surgeon: Daneil Dolin, MD;  [facility];  Service: Endoscopy;  Laterality: N/A;  7:30am   HOT HEMOSTASIS  05/10/2021   Procedure: HOT HEMOSTASIS (ARGON PLASMA COAGULATION/BICAP);  Surgeon: Daneil Dolin, MD;  [facility];  Service: Endoscopy;;   NO PAST SURGERIES     none     POLYPECTOMY  06/24/2018   Procedure: POLYPECTOMY;  Surgeon: Daneil Dolin, MD;  [facility];  Service: Endoscopy;;  sigmoid polyp hs   VIDEO BRONCHOSCOPY WITH ENDOBRONCHIAL NAVIGATION N/A 03/09/2021   Procedure: VIDEO BRONCHOSCOPY WITH ENDOBRONCHIAL NAVIGATION;  Surgeon: [place] Nakayama, MD;  Location: [facility];  Service: Thoracic;  Laterality: N/A;   VIDEO BRONCHOSCOPY WITH ENDOBRONCHIAL ULTRASOUND N/A 03/09/2021   Procedure: VIDEO BRONCHOSCOPY WITH  ENDOBRONCHIAL ULTRASOUND;  Surgeon: [place] Nakayama, MD;  Location: [facility] OR;  Service: Thoracic;  Laterality: N/A;    SOCIAL HISTORY: Social History   Socioeconomic History   Marital status: Married    Spouse name: Ramone Gander   Number  of children: Not on file   Years of education: Not on file   Highest education level: 8th grade  Occupational History   Occupation: retired  Tobacco Use   Smoking status: Former    Packs/day: 0.50    Years: 67.00    Pack years: 33.50    Types: Cigarettes    Quit date: 12/11/2017    Years since quitting: 3.4   Smokeless tobacco: Never  Vaping Use   Vaping Use: Never used  Substance and Sexual Activity   Alcohol use: No   Drug use: No   Sexual activity: Not Currently  Other Topics Concern   Not on file  Social History Narrative   Lives with wife Staples in [place]   Dog named poppet      Enjoys hanging around [employer]: eats all food groups   Caffeine: coffee   Water: 4-5 cups daily      Wears seat belt    Does not use phone while driving    Oceanographer at home    Social Determinants of Health   Financial Resource Strain: Low Risk    Difficulty of Paying Living Expenses: Not hard at all  Food Insecurity: No Food Insecurity   Worried About Charity fundraiser in the Last Year: Never true   Arboriculturist in the Last Year: Never true  Transportation Needs: No Transportation Needs   Lack of Transportation (Medical): No   Lack of Transportation (Non-Medical): No  Physical Activity: Inactive   Days of Exercise per Week: 0 days   Minutes of Exercise per Session: 0 min  Stress: No Stress Concern Present   Feeling of Stress : Only a little  Social Connections: Socially Isolated   Frequency of Communication with Friends and Family: Once a week   Frequency of Social Gatherings with Friends and Family: Never   Attends Religious Services: Never   Printmaker: No   Attends Music therapist: Never   Marital Status: Married  Human resources officer Violence: Not At Risk   Fear of Current or Ex-Partner: No   Emotionally Abused: No   Physically Abused: No   Sexually Abused: No    FAMILY HISTORY: Family History  Problem Relation  Age of Onset   Colon cancer Neg Hx     ALLERGIES:  has No Known Allergies.  MEDICATIONS:  Current Outpatient Medications  Medication Sig Dispense Refill   acetaminophen (TYLENOL) 500 MG tablet Take 500 mg by mouth every 6 (six) hours as needed for moderate pain or mild pain.     amLODipine (NORVASC) 5 MG tablet Take 1 tablet (5 mg total) by mouth daily. 30 tablet 5   aspirin EC 81 MG tablet Take 1 tablet (81 mg total) by mouth every morning. Swallow whole.     atorvastatin (LIPITOR) 10 MG tablet Take 1 tablet (10 mg total) by mouth daily with supper. 90 tablet 3   carvedilol (COREG) 12.5 MG tablet TAKE 1 TABLET BY MOUTH TWICE DAILY with a meal 30 tablet 3   FEROSUL 325 (65 Fe) MG tablet Take 325 mg by mouth 2 (two) times daily with a meal.  Multiple Vitamins-Minerals (MULTIVITAMIN WITH MINERALS) tablet Take 1 tablet by mouth every morning.     pantoprazole (PROTONIX) 40 MG tablet Take 1 tablet (40 mg total) by mouth 2 (two) times daily. 60 tablet 1   vitamin C (ASCORBIC ACID) 500 MG tablet Take 500 mg by mouth every morning.     vitamin E 100 UNIT capsule Take 100 Units by mouth every morning.     No current facility-administered medications for this visit.    REVIEW OF SYSTEMS:   Constitutional: Denies fevers, chills or abnormal night sweats Eyes: Denies blurriness of vision, double vision or watery eyes Ears, nose, mouth, throat, and face: Denies mucositis or sore throat Respiratory: Denies cough, dyspnea or wheezes Cardiovascular: Denies palpitation, chest discomfort or lower extremity swelling Gastrointestinal:  Denies nausea, heartburn or change in bowel habits Skin: Denies abnormal skin rashes Lymphatics: Denies new lymphadenopathy or easy bruising Neurological:Denies numbness, tingling or new weaknesses Behavioral/Psych: Mood is stable, no new changes  All other systems were reviewed with the patient and are negative.  PHYSICAL EXAMINATION: ECOG PERFORMANCE STATUS: 1 -  Symptomatic but completely ambulatory  Vitals:   05/23/21 1309  BP: 140/69  Pulse: 76  Resp: 18  Temp: (!) 96.8 F (36 C)  SpO2: 100%   Filed Weights   05/23/21 1309  Weight: 133 lb 6.4 oz (60.5 kg)    GENERAL:alert, no distress and comfortable SKIN: skin color, texture, turgor are normal, no rashes or significant lesions EYES: normal, conjunctiva are pink and non-injected, sclera clear OROPHARYNX:no exudate, normal lips, buccal mucosa, and tongue  NECK: supple, thyroid normal size, non-tender, without nodularity LYMPH:  no palpable lymphadenopathy in the cervical, axillary or inguinal LUNGS: clear to auscultation and percussion with normal breathing effort HEART: regular rate & rhythm and no murmurs without lower extremity edema ABDOMEN:abdomen soft, non-tender and normal bowel sounds Musculoskeletal:no cyanosis of digits and no clubbing  PSYCH: alert & oriented x 3 with fluent speech NEURO: no focal motor/sensory deficits  LABORATORY DATA:  I have reviewed the data as listed Lab Results  Component Value Date   WBC 7.7 05/11/2021   HGB 8.1 (L) 05/11/2021   HCT 28.9 (L) 05/11/2021   MCV 73.7 (L) 05/11/2021   PLT 265 05/11/2021   Recent Labs    05/09/21 1411 05/09/21 2038 05/10/21 0315  NA 137 135 138  K 4.2 4.5 4.1  CL 107 106 107  CO2 22 23 24   GLUCOSE 125* 116* 94  BUN 23 23 21   CREATININE 1.25* 1.15 1.17  CALCIUM 9.2 8.7* 9.0  GFRNONAA 57* >60 >60  PROT 6.3* 6.2* 6.1*  ALBUMIN 3.3* 3.2* 3.4*  AST 19 22 18   ALT 11 12 11   ALKPHOS 80 76 77  BILITOT 0.5 0.4 1.1   ASSESSMENT & PLAN:  Squamous cell lung cancer, right (HCC) His treatment was placed on hold due to recent hospitalization from severe iron deficiency anemia from blood loss I encouraged the patient and his wife to call radiation oncologist to set up radiation treatment to start as soon as possible, possibly as early as first week of August He will return again next month for further  follow-up  Iron deficiency anemia due to chronic blood loss He had recent severe iron deficiency anemia secondary to GI bleed He resumed taking aspirin 2 days ago without signs of bleeding He denies recent pica He tolerated IV iron well He will get another dose of IV iron today I recommend return visit in 1 month  for repeat blood work and further IV iron if needed  No orders of the defined types were placed in this encounter.   All questions were answered. The patient knows to call the clinic with any problems, questions or concerns. The total time spent in the appointment was 25 minutes encounter with patients including review of chart and various tests results, discussions about plan of care and coordination of care plan   Heath Lark, MD 05/23/2021 1:30 PM

## 2021-05-23 NOTE — Progress Notes (Signed)
Patient tolerated iron infusion with no complaints voiced.  Peripheral IV site clean and dry with good blood return noted before and after infusion.  Band aid applied.  VSS with discharge and left in satisfactory condition with no s/s of distress noted.   

## 2021-05-24 ENCOUNTER — Ambulatory Visit: Payer: PPO

## 2021-05-24 DIAGNOSIS — C3411 Malignant neoplasm of upper lobe, right bronchus or lung: Secondary | ICD-10-CM | POA: Insufficient documentation

## 2021-05-28 ENCOUNTER — Other Ambulatory Visit (HOSPITAL_COMMUNITY): Payer: Self-pay

## 2021-05-28 DIAGNOSIS — C3491 Malignant neoplasm of unspecified part of right bronchus or lung: Secondary | ICD-10-CM

## 2021-05-28 DIAGNOSIS — D500 Iron deficiency anemia secondary to blood loss (chronic): Secondary | ICD-10-CM

## 2021-05-29 ENCOUNTER — Ambulatory Visit: Payer: PPO

## 2021-06-05 ENCOUNTER — Ambulatory Visit (INDEPENDENT_AMBULATORY_CARE_PROVIDER_SITE_OTHER): Payer: PPO | Admitting: Nurse Practitioner

## 2021-06-05 ENCOUNTER — Encounter: Payer: Self-pay | Admitting: Nurse Practitioner

## 2021-06-05 ENCOUNTER — Other Ambulatory Visit: Payer: Self-pay

## 2021-06-05 DIAGNOSIS — D62 Acute posthemorrhagic anemia: Secondary | ICD-10-CM

## 2021-06-05 DIAGNOSIS — K219 Gastro-esophageal reflux disease without esophagitis: Secondary | ICD-10-CM | POA: Diagnosis not present

## 2021-06-05 DIAGNOSIS — C3491 Malignant neoplasm of unspecified part of right bronchus or lung: Secondary | ICD-10-CM | POA: Diagnosis not present

## 2021-06-05 DIAGNOSIS — I10 Essential (primary) hypertension: Secondary | ICD-10-CM

## 2021-06-05 DIAGNOSIS — N1832 Chronic kidney disease, stage 3b: Secondary | ICD-10-CM | POA: Diagnosis not present

## 2021-06-05 DIAGNOSIS — K922 Gastrointestinal hemorrhage, unspecified: Secondary | ICD-10-CM | POA: Diagnosis not present

## 2021-06-05 MED ORDER — PANTOPRAZOLE SODIUM 40 MG PO TBEC
40.0000 mg | DELAYED_RELEASE_TABLET | Freq: Two times a day (BID) | ORAL | 1 refills | Status: DC
Start: 2021-06-05 — End: 2021-08-06

## 2021-06-05 NOTE — Assessment & Plan Note (Signed)
-  still has some pain from biospy -has radiation starting 06/18/21

## 2021-06-05 NOTE — Patient Instructions (Signed)
Please have labs drawn today

## 2021-06-05 NOTE — Progress Notes (Signed)
Established Patient Office Visit  Subjective:  Patient ID: Jared Schmidt, male    DOB: 1936-07-06  Age: 85 y.o. MRN: [id number]  CC:  Chief Complaint  Patient presents with   Transitions Of Care    Had a biopsy done, still having some pain from that procedure.    HPI Jared Schmidt presents for hospital follow-up. He was admitted from 7/6-05/11/21 for upper GI bleed.  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 -Status post endoscopic evaluation and ablation -Hemoglobin Stabilized after transfusion -At discharge hemoglobin 8.1. -Please repeat CBC to follow hemoglobin trend -Patient declined inpatient IV iron infusion; okay to take Niferex daily.  Resume follow-up with hematology oncology for IV iron infusions as an outpatient. -Following GI recommendations stop baby aspirin for 10 days and continue the use of PPI. -Avoid the use of NSAIDs over-the-counter."  He was discharged with instructions to take protonix BID, but his prescription was for daily protonix.   He still has some pain after his lung biopsy, but he has not noticed any further bleeding. Past Medical History:  Diagnosis Date   Anemia    Arthritis    GERD (gastroesophageal reflux disease)    Hypercholesteremia    Hypertension    Iron deficiency anemia due to chronic blood loss 03/17/2018   Iron deficiency anemia due to chronic blood loss 03/17/2018   Stroke ([place])    TIA 2020   Symptomatic anemia 01/20/2018   Vitamin B deficiency    patient denies    Past Surgical History:  Procedure Laterality Date   BIOPSY  06/24/2018   Procedure: BIOPSY;  Surgeon: Daneil Dolin, MD;  [facility];  Service: Endoscopy;;  gastric    COLONOSCOPY N/A 06/24/2018   Dr. Gala Romney: Internal hemorrhoids, diverticulosis, 9 mm polyp removed from the sigmoid colon which was hyperplastic.    ENTEROSCOPY N/A 05/10/2021   Procedure: ENTEROSCOPY;  Surgeon: Daneil Dolin, MD;  [facility];  Service: Endoscopy;  Laterality: N/A;   ESOPHAGOGASTRODUODENOSCOPY N/A 06/24/2018   Dr. Gala Romney: Multiple erosions in the stomach and duodenal bulb, gastric biopsies with chronic gastritis with intestinal metaplasia, no H. pylori.   ESOPHAGOGASTRODUODENOSCOPY (EGD) WITH PROPOFOL N/A 09/17/2018   Procedure: ESOPHAGOGASTRODUODENOSCOPY (EGD) WITH PROPOFOL;  Surgeon: Daneil Dolin, MD;  [facility];  Service: Endoscopy;  Laterality: N/A;  11:00am   ESOPHAGOGASTRODUODENOSCOPY (EGD) WITH PROPOFOL N/A 05/10/2021   Procedure: ESOPHAGOGASTRODUODENOSCOPY (EGD) WITH PROPOFOL;  Surgeon: Daneil Dolin, MD;  [facility];  Service: Endoscopy;  Laterality: N/A;   GIVENS CAPSULE STUDY N/A 09/15/2018   Procedure: GIVENS CAPSULE STUDY;  Surgeon: Daneil Dolin, MD;  [facility];  Service: Endoscopy;  Laterality: N/A;  7:30am   HOT HEMOSTASIS  05/10/2021   Procedure: HOT HEMOSTASIS (ARGON PLASMA COAGULATION/BICAP);  Surgeon: Daneil Dolin, MD;  [facility];  Service: Endoscopy;;   NO PAST SURGERIES     none     POLYPECTOMY  06/24/2018   Procedure: POLYPECTOMY;  Surgeon: Daneil Dolin, MD;  [facility];  Service: Endoscopy;;  sigmoid polyp hs   VIDEO BRONCHOSCOPY WITH ENDOBRONCHIAL NAVIGATION N/A 03/09/2021   Procedure: VIDEO BRONCHOSCOPY WITH ENDOBRONCHIAL NAVIGATION;  Surgeon: Melrose Nakayama, MD;  Location: [place];  Service: Thoracic;  Laterality: N/A;   VIDEO BRONCHOSCOPY WITH ENDOBRONCHIAL ULTRASOUND N/A 03/09/2021   Procedure:  VIDEO BRONCHOSCOPY WITH ENDOBRONCHIAL ULTRASOUND;  Surgeon: Melrose Nakayama, MD;  Location: [facility] OR;  Service: Thoracic;  Laterality: N/A;    Family History  Problem Relation Age of Onset   Colon cancer Neg Hx     Social History   Socioeconomic History   Marital status: Married    Spouse name: Jared Schmidt   Number of children: Not on file   Years of education: Not on file   Highest education level: 8th grade  Occupational History   Occupation: retired  Tobacco Use   Smoking status: Former    Packs/day: 0.50    Years: 67.00    Pack years: 33.50    Types: Cigarettes    Quit date: 12/11/2017    Years since quitting: 3.4   Smokeless tobacco: Never  Vaping Use   Vaping Use: Never used  Substance and Sexual Activity   Alcohol use: No   Drug use: No   Sexual activity: Not Currently  Other Topics Concern   Not on file  Social History Narrative   Lives with wife Ayr in [place]   Dog named poppet      Enjoys hanging around [employer]: eats all food groups   Caffeine: coffee   Water: 4-5 cups daily      Wears seat belt    Does not use phone while driving    Oceanographer at home    Social Determinants of Health   Financial Resource Strain: Low Risk    Difficulty of Paying Living Expenses: Not hard at all  Food Insecurity: No Food Insecurity   Worried About Charity fundraiser in the Last Year: Never true   Arboriculturist in the Last Year: Never true  Transportation Needs: No Transportation Needs   Lack of Transportation (Medical): No   Lack of Transportation (Non-Medical): No  Physical Activity: Inactive   Days of Exercise per Week: 0 days   Minutes of Exercise per Session: 0 min  Stress: No Stress Concern Present   Feeling of Stress : Only a little  Social Connections: Socially Isolated   Frequency of Communication with Friends and Family: Once a week   Frequency of Social Gatherings with Friends and Family: Never   Attends Religious Services: Never   Printmaker: No   Attends Music therapist: Never   Marital Status: Married  Human resources officer Violence: Not At Risk   Fear of Current or Ex-Partner: No   Emotionally Abused: No   Physically Abused: No   Sexually Abused: No    Outpatient Medications Prior to  Visit  Medication Sig Dispense Refill   acetaminophen (TYLENOL) 500 MG tablet Take 500 mg by mouth every 6 (six) hours as needed for moderate pain or mild pain.     amLODipine (NORVASC) 5 MG tablet Take 1 tablet (5 mg total) by mouth daily. 30 tablet 5   aspirin EC 81 MG tablet Take 1 tablet (81 mg total) by mouth every morning. Swallow whole.     atorvastatin (LIPITOR) 10 MG tablet Take 1 tablet (10 mg total) by mouth daily with supper. 90 tablet 3   carvedilol (COREG) 12.5 MG tablet TAKE 1 TABLET BY MOUTH TWICE DAILY with a meal 30 tablet 3   FEROSUL 325 (65 Fe) MG tablet Take 325 mg by mouth 2 (two) times daily with a meal.     Multiple Vitamins-Minerals (MULTIVITAMIN WITH MINERALS) tablet  Take 1 tablet by mouth every morning.     vitamin C (ASCORBIC ACID) 500 MG tablet Take 500 mg by mouth every morning.     vitamin E 100 UNIT capsule Take 100 Units by mouth every morning.     pantoprazole (PROTONIX) 40 MG tablet Take 1 tablet (40 mg total) by mouth 2 (two) times daily. 60 tablet 1   No facility-administered medications prior to visit.    No Known Allergies  ROS Review of Systems  Constitutional: Negative.   Respiratory: Negative.         Has some pain after lung biopsy that has not yet resolved; slowly improving  Cardiovascular: Negative.   Gastrointestinal: Negative.   Psychiatric/Behavioral: Negative.       Objective:    Physical Exam Constitutional:      Appearance: Normal appearance.  Cardiovascular:     Rate and Rhythm: Normal rate and regular rhythm.     Pulses: Normal pulses.     Heart sounds: Normal heart sounds.  Pulmonary:     Effort: Pulmonary effort is normal.     Breath sounds: Normal breath sounds.  Neurological:     Mental Status: He is alert.  Psychiatric:        Mood and Affect: Mood normal.        Behavior: Behavior normal.        Thought Content: Thought content normal.        Judgment: Judgment normal.    BP 134/77 (BP Location: Left Arm,  Patient Position: Sitting, Cuff Size: Normal)   Pulse 97   Temp 98.4 F (36.9 C) (Oral)   Ht $R'5\' 7"'pd$  (1.702 m)   Wt 130 lb (59 kg)   SpO2 96%   BMI 20.36 kg/m  Wt Readings from Last 3 Encounters:  06/05/21 130 lb (59 kg)  05/23/21 133 lb 6.4 oz (60.5 kg)  05/09/21 129 lb 3 oz (58.6 kg)     Health Maintenance Due  Topic Date Due   INFLUENZA VACCINE  06/04/2021    There are no preventive care reminders to display for this patient.  No results found for: TSH Lab Results  Component Value Date   WBC 7.7 05/11/2021   HGB 8.1 (L) 05/11/2021   HCT 28.9 (L) 05/11/2021   MCV 73.7 (L) 05/11/2021   PLT 265 05/11/2021   Lab Results  Component Value Date   NA 138 05/10/2021   K 4.1 05/10/2021   CO2 24 05/10/2021   GLUCOSE 94 05/10/2021   BUN 21 05/10/2021   CREATININE 1.17 05/10/2021   BILITOT 1.1 05/10/2021   ALKPHOS 77 05/10/2021   AST 18 05/10/2021   ALT 11 05/10/2021   PROT 6.1 (L) 05/10/2021   ALBUMIN 3.4 (L) 05/10/2021   CALCIUM 9.0 05/10/2021   ANIONGAP 7 05/10/2021   EGFR 62 02/06/2021   Lab Results  Component Value Date   CHOL 118 02/06/2021   Lab Results  Component Value Date   HDL 38 (L) 02/06/2021   Lab Results  Component Value Date   LDLCALC 58 02/06/2021   Lab Results  Component Value Date   TRIG 122 02/06/2021   Lab Results  Component Value Date   CHOLHDL 3.4 02/16/2020   No results found for: HGBA1C    Assessment & Plan:   Problem List Items Addressed This Visit       Cardiovascular and Mediastinum   Hypertension    -BP well controlled today BP Readings from Last 3 Encounters:  06/05/21 134/77  05/23/21 (!) 164/77  05/23/21 140/69         Relevant Orders   Basic metabolic panel   CBC with Differential/Platelet     Respiratory   Squamous cell lung cancer, right ([place])    -still has some pain from biospy -has radiation starting 06/18/21         Digestive   GERD (gastroesophageal reflux disease)   Relevant Medications    pantoprazole (PROTONIX) 40 MG tablet   UGI bleed    After checking Mahala Menghini note from 7/6/2, he should be on protonix BID. -changed his rx to BID protonix (From daily) -checking CBC and BMP       Relevant Orders   Basic metabolic panel   CBC with Differential/Platelet     Genitourinary   Chronic renal failure, stage 3b ([place])    -checking labs today       Relevant Orders   Basic metabolic panel   CBC with Differential/Platelet     Other   Acute blood loss anemia    -checking CBC today -expect iron to be low after GI bleed; may add iron panel based on results       Relevant Orders   Basic metabolic panel   CBC with Differential/Platelet    Meds ordered this encounter  Medications   pantoprazole (PROTONIX) 40 MG tablet    Sig: Take 1 tablet (40 mg total) by mouth 2 (two) times daily.    Dispense:  60 tablet    Refill:  1     Follow-up: Return if symptoms worsen or fail to improve.    Noreene Larsson, NP

## 2021-06-05 NOTE — Assessment & Plan Note (Addendum)
-  checking CBC today -expect iron to be low after GI bleed; may add iron panel based on results

## 2021-06-05 NOTE — Assessment & Plan Note (Signed)
-  BP well controlled today BP Readings from Last 3 Encounters:  06/05/21 134/77  05/23/21 (!) 164/77  05/23/21 140/69

## 2021-06-05 NOTE — Assessment & Plan Note (Signed)
-  checking labs today

## 2021-06-05 NOTE — Assessment & Plan Note (Addendum)
After checking Mahala Menghini note from 7/6/2, he should be on protonix BID. -changed his rx to BID protonix (From daily) -checking CBC and BMP

## 2021-06-06 LAB — BASIC METABOLIC PANEL
BUN/Creatinine Ratio: 16 (ref 10–24)
BUN: 19 mg/dL (ref 8–27)
CO2: 25 mmol/L (ref 20–29)
Calcium: 10 mg/dL (ref 8.6–10.2)
Chloride: 101 mmol/L (ref 96–106)
Creatinine, Ser: 1.17 mg/dL (ref 0.76–1.27)
Glucose: 103 mg/dL — ABNORMAL HIGH (ref 65–99)
Potassium: 5.1 mmol/L (ref 3.5–5.2)
Sodium: 139 mmol/L (ref 134–144)
eGFR: 62 mL/min/{1.73_m2} (ref >59)

## 2021-06-06 LAB — CBC WITH DIFFERENTIAL/PLATELET
Basophils Absolute: 0.1 10*3/uL (ref 0.0–0.2)
Basos: 1 %
EOS (ABSOLUTE): 0.2 10*3/uL (ref 0.0–0.4)
Eos: 2 %
Hematocrit: 33.8 % — ABNORMAL LOW (ref 37.5–51.0)
Hemoglobin: 9.8 g/dL — ABNORMAL LOW (ref 13.0–17.7)
Immature Grans (Abs): 0 10*3/uL (ref 0.0–0.1)
Immature Granulocytes: 0 %
Lymphocytes Absolute: 0.9 10*3/uL (ref 0.7–3.1)
Lymphs: 10 %
MCH: 23.7 pg — ABNORMAL LOW (ref 26.6–33.0)
MCHC: 29 g/dL — ABNORMAL LOW (ref 31.5–35.7)
MCV: 82 fL (ref 79–97)
Monocytes Absolute: 1 10*3/uL — ABNORMAL HIGH (ref 0.1–0.9)
Monocytes: 11 %
Neutrophils Absolute: 6.8 10*3/uL (ref 1.4–7.0)
Neutrophils: 76 %
Platelets: 457 10*3/uL — ABNORMAL HIGH (ref 150–450)
RBC: 4.14 x10E6/uL (ref 4.14–5.80)
RDW: 24.6 % — ABNORMAL HIGH (ref 11.6–15.4)
WBC: 9 10*3/uL (ref 3.4–10.8)

## 2021-06-06 NOTE — Progress Notes (Signed)
RBC and hemoglobin are improving.

## 2021-06-18 ENCOUNTER — Other Ambulatory Visit: Payer: Self-pay

## 2021-06-18 ENCOUNTER — Ambulatory Visit
Admission: RE | Admit: 2021-06-18 | Discharge: 2021-06-18 | Disposition: A | Discharge status: Home / Self Care | Payer: PPO | Source: Ambulatory Visit | Attending: Radiation Oncology | Admitting: Radiation Oncology

## 2021-06-18 DIAGNOSIS — C3411 Malignant neoplasm of upper lobe, right bronchus or lung: Secondary | ICD-10-CM | POA: Diagnosis not present

## 2021-06-18 DIAGNOSIS — Z510 Encounter for antineoplastic radiation therapy: Secondary | ICD-10-CM | POA: Diagnosis not present

## 2021-06-20 ENCOUNTER — Ambulatory Visit
Admission: RE | Admit: 2021-06-20 | Discharge: 2021-06-20 | Disposition: A | Discharge status: Home / Self Care | Payer: PPO | Source: Ambulatory Visit | Attending: Radiation Oncology | Admitting: Radiation Oncology

## 2021-06-20 ENCOUNTER — Other Ambulatory Visit: Payer: Self-pay

## 2021-06-20 DIAGNOSIS — Z510 Encounter for antineoplastic radiation therapy: Secondary | ICD-10-CM | POA: Diagnosis not present

## 2021-06-21 ENCOUNTER — Inpatient Hospital Stay (HOSPITAL_COMMUNITY): Payer: PPO | Attending: Hematology

## 2021-06-21 DIAGNOSIS — C3491 Malignant neoplasm of unspecified part of right bronchus or lung: Secondary | ICD-10-CM | POA: Diagnosis not present

## 2021-06-21 DIAGNOSIS — Z79899 Other long term (current) drug therapy: Secondary | ICD-10-CM | POA: Insufficient documentation

## 2021-06-21 DIAGNOSIS — I10 Essential (primary) hypertension: Secondary | ICD-10-CM | POA: Diagnosis not present

## 2021-06-21 DIAGNOSIS — M1990 Unspecified osteoarthritis, unspecified site: Secondary | ICD-10-CM | POA: Insufficient documentation

## 2021-06-21 DIAGNOSIS — D500 Iron deficiency anemia secondary to blood loss (chronic): Secondary | ICD-10-CM

## 2021-06-21 DIAGNOSIS — D509 Iron deficiency anemia, unspecified: Secondary | ICD-10-CM | POA: Insufficient documentation

## 2021-06-21 DIAGNOSIS — E539 Vitamin B deficiency, unspecified: Secondary | ICD-10-CM | POA: Diagnosis not present

## 2021-06-21 DIAGNOSIS — R5383 Other fatigue: Secondary | ICD-10-CM | POA: Insufficient documentation

## 2021-06-21 DIAGNOSIS — R0602 Shortness of breath: Secondary | ICD-10-CM | POA: Diagnosis not present

## 2021-06-21 DIAGNOSIS — Z8673 Personal history of transient ischemic attack (TIA), and cerebral infarction without residual deficits: Secondary | ICD-10-CM | POA: Diagnosis not present

## 2021-06-21 DIAGNOSIS — Z7982 Long term (current) use of aspirin: Secondary | ICD-10-CM | POA: Diagnosis not present

## 2021-06-21 DIAGNOSIS — Z87891 Personal history of nicotine dependence: Secondary | ICD-10-CM | POA: Diagnosis not present

## 2021-06-21 DIAGNOSIS — K5900 Constipation, unspecified: Secondary | ICD-10-CM | POA: Insufficient documentation

## 2021-06-21 DIAGNOSIS — K219 Gastro-esophageal reflux disease without esophagitis: Secondary | ICD-10-CM | POA: Diagnosis not present

## 2021-06-21 LAB — COMPREHENSIVE METABOLIC PANEL
ALT: 14 U/L (ref 0–44)
AST: 24 U/L (ref 15–41)
Albumin: 3.5 g/dL (ref 3.5–5.0)
Alkaline Phosphatase: 98 U/L (ref 38–126)
Anion gap: 7 (ref 5–15)
BUN: 23 mg/dL (ref 8–23)
CO2: 26 mmol/L (ref 22–32)
Calcium: 9.5 mg/dL (ref 8.9–10.3)
Chloride: 102 mmol/L (ref 98–111)
Creatinine, Ser: 1.18 mg/dL (ref 0.61–1.24)
GFR, Estimated: >60 mL/min (ref >60)
Glucose, Bld: 108 mg/dL — ABNORMAL HIGH (ref 70–99)
Potassium: 4.7 mmol/L (ref 3.5–5.1)
Sodium: 135 mmol/L (ref 135–145)
Total Bilirubin: 0.4 mg/dL (ref 0.3–1.2)
Total Protein: 7.1 g/dL (ref 6.5–8.1)

## 2021-06-21 LAB — CBC WITH DIFFERENTIAL/PLATELET
Abs Immature Granulocytes: 0.03 10*3/uL (ref 0.00–0.07)
Basophils Absolute: 0 10*3/uL (ref 0.0–0.1)
Basophils Relative: 1 %
Eosinophils Absolute: 0.2 10*3/uL (ref 0.0–0.5)
Eosinophils Relative: 2 %
HCT: 33.9 % — ABNORMAL LOW (ref 39.0–52.0)
Hemoglobin: 9.9 g/dL — ABNORMAL LOW (ref 13.0–17.0)
Immature Granulocytes: 0 %
Lymphocytes Relative: 5 %
Lymphs Abs: 0.5 10*3/uL — ABNORMAL LOW (ref 0.7–4.0)
MCH: 25 pg — ABNORMAL LOW (ref 26.0–34.0)
MCHC: 29.2 g/dL — ABNORMAL LOW (ref 30.0–36.0)
MCV: 85.6 fL (ref 80.0–100.0)
Monocytes Absolute: 1.1 10*3/uL — ABNORMAL HIGH (ref 0.1–1.0)
Monocytes Relative: 13 %
Neutro Abs: 6.6 10*3/uL (ref 1.7–7.7)
Neutrophils Relative %: 79 %
Platelets: 355 10*3/uL (ref 150–400)
RBC: 3.96 MIL/uL — ABNORMAL LOW (ref 4.22–5.81)
RDW: 23.4 % — ABNORMAL HIGH (ref 11.5–15.5)
WBC: 8.4 10*3/uL (ref 4.0–10.5)
nRBC: 0 % (ref 0.0–0.2)

## 2021-06-21 LAB — IRON AND TIBC
Iron: 47 ug/dL (ref 45–182)
Saturation Ratios: 17 % — ABNORMAL LOW (ref 17.9–39.5)
TIBC: 270 ug/dL (ref 250–450)
UIBC: 223 ug/dL

## 2021-06-21 LAB — FERRITIN: Ferritin: 145 ng/mL (ref 24–336)

## 2021-06-22 ENCOUNTER — Inpatient Hospital Stay (HOSPITAL_COMMUNITY): Payer: PPO

## 2021-06-22 ENCOUNTER — Other Ambulatory Visit: Payer: Self-pay

## 2021-06-22 ENCOUNTER — Ambulatory Visit
Admission: RE | Admit: 2021-06-22 | Discharge: 2021-06-22 | Disposition: A | Discharge status: Home / Self Care | Payer: PPO | Source: Ambulatory Visit | Attending: Radiation Oncology | Admitting: Radiation Oncology

## 2021-06-22 DIAGNOSIS — Z510 Encounter for antineoplastic radiation therapy: Secondary | ICD-10-CM | POA: Diagnosis not present

## 2021-06-25 ENCOUNTER — Ambulatory Visit (HOSPITAL_COMMUNITY): Payer: PPO | Admitting: Hematology

## 2021-06-25 ENCOUNTER — Ambulatory Visit: Payer: PPO

## 2021-06-25 ENCOUNTER — Telehealth: Payer: Self-pay

## 2021-06-25 ENCOUNTER — Ambulatory Visit (HOSPITAL_COMMUNITY): Payer: PPO

## 2021-06-25 NOTE — Progress Notes (Addendum)
[name], [place] [address]   CLINIC:  Medical Oncology/Hematology  PCP:  Noreene Larsson, NP [address]  [address] [place] [address] / [place] [address] [phone]   REASON FOR VISIT:  Follow-up for right lung cancer  PRIOR THERAPY: none  NGS Results: not done  CURRENT THERAPY: under work-up  BRIEF ONCOLOGIC HISTORY:  Oncology History  Iron deficiency anemia due to chronic blood loss    CANCER STAGING: Cancer Staging Squamous cell lung cancer, right ([place]) Staging form: Lung, AJCC 8th Edition - Clinical stage from 03/20/2021: Stage IIB (cT3, cN0, cM0) - Unsigned   INTERVAL HISTORY:  [name], a 85 y.o. male, returns for routine follow-up of his right lung cancer. Swade was last seen on 05/23/2021 by Dr. Heath Lark.   Today he reports feeling well. He will complete radiation on Friday. He denies any bleeding issues or black stools. He reports some mild fatigue and constipation.   REVIEW OF SYSTEMS:  Review of Systems  Constitutional:  Positive for appetite change (50%) and fatigue (40%).  HENT:   Negative for nosebleeds.   Respiratory:  Positive for shortness of breath (w/ exertion).   Gastrointestinal:  Positive for constipation. Negative for blood in stool.  Genitourinary:  Negative for hematuria.   Hematological:  Does not bruise/bleed easily.  All other systems reviewed and are negative.  PAST MEDICAL/SURGICAL HISTORY:  Past Medical History:  Diagnosis Date   Anemia    Arthritis    GERD (gastroesophageal reflux disease)    Hypercholesteremia    Hypertension    Iron deficiency anemia due to chronic blood loss 03/17/2018   Iron deficiency anemia due to chronic blood loss 03/17/2018   Stroke ([place])    TIA 2020   Symptomatic anemia 01/20/2018   Vitamin B deficiency    patient denies   Past Surgical History:  Procedure Laterality Date   BIOPSY  06/24/2018   Procedure: BIOPSY;  Surgeon: Daneil Dolin, MD;   [facility];  Service: Endoscopy;;  gastric    COLONOSCOPY N/A 06/24/2018   Dr. Gala Romney: Internal hemorrhoids, diverticulosis, 9 mm polyp removed from the sigmoid colon which was hyperplastic.   ENTEROSCOPY N/A 05/10/2021   Procedure: ENTEROSCOPY;  Surgeon: Daneil Dolin, MD;  [facility];  Service: Endoscopy;  Laterality: N/A;   ESOPHAGOGASTRODUODENOSCOPY N/A 06/24/2018   Dr. Gala Romney: Multiple erosions in the stomach and duodenal bulb, gastric biopsies with chronic gastritis with intestinal metaplasia, no H. pylori.   ESOPHAGOGASTRODUODENOSCOPY (EGD) WITH PROPOFOL N/A 09/17/2018   Procedure: ESOPHAGOGASTRODUODENOSCOPY (EGD) WITH PROPOFOL;  Surgeon: Daneil Dolin, MD;  [facility];  Service: Endoscopy;  Laterality: N/A;  11:00am   ESOPHAGOGASTRODUODENOSCOPY (EGD) WITH PROPOFOL N/A 05/10/2021   Procedure: ESOPHAGOGASTRODUODENOSCOPY (EGD) WITH PROPOFOL;  Surgeon: Daneil Dolin, MD;  [facility];  Service: Endoscopy;  Laterality: N/A;   GIVENS CAPSULE STUDY N/A 09/15/2018   Procedure: GIVENS CAPSULE STUDY;  Surgeon: Daneil Dolin, MD;  [facility];  Service: Endoscopy;  Laterality: N/A;  7:30am   HOT HEMOSTASIS  05/10/2021   Procedure: HOT HEMOSTASIS (ARGON PLASMA COAGULATION/BICAP);  Surgeon: Daneil Dolin, MD;  [facility];  Service: Endoscopy;;   NO PAST SURGERIES     none     POLYPECTOMY  06/24/2018   Procedure: POLYPECTOMY;  Surgeon: Daneil Dolin, MD;  [facility];  Service: Endoscopy;;  sigmoid polyp hs  VIDEO BRONCHOSCOPY WITH ENDOBRONCHIAL NAVIGATION N/A 03/09/2021   Procedure: VIDEO BRONCHOSCOPY WITH ENDOBRONCHIAL NAVIGATION;  Surgeon: Melrose Nakayama, MD;  Location: [place];  Service: Thoracic;  Laterality: N/A;   VIDEO BRONCHOSCOPY WITH ENDOBRONCHIAL ULTRASOUND N/A 03/09/2021   Procedure: VIDEO BRONCHOSCOPY WITH ENDOBRONCHIAL ULTRASOUND;  Surgeon: Melrose Nakayama, MD;  [facility];   Service: Thoracic;  Laterality: N/A;    SOCIAL HISTORY:  Social History   Socioeconomic History   Marital status: Married    Spouse name: Brysten Reister   Number of children: Not on file   Years of education: Not on file   Highest education level: 8th grade  Occupational History   Occupation: retired  Tobacco Use   Smoking status: Former    Packs/day: 0.50    Years: 67.00    Pack years: 33.50    Types: Cigarettes    Quit date: 12/11/2017    Years since quitting: 3.5   Smokeless tobacco: Never  Vaping Use   Vaping Use: Never used  Substance and Sexual Activity   Alcohol use: No   Drug use: No   Sexual activity: Not Currently  Other Topics Concern   Not on file  Social History Narrative   Lives with wife [place] in [place]   Dog named poppet      Enjoys hanging around [employer]: eats all food groups   Caffeine: coffee   Water: 4-5 cups daily      Wears seat belt    Does not use phone while driving    Oceanographer at home    Social Determinants of Health   Financial Resource Strain: Low Risk    Difficulty of Paying Living Expenses: Not hard at all  Food Insecurity: No Food Insecurity   Worried About Charity fundraiser in the Last Year: Never true   Arboriculturist in the Last Year: Never true  Transportation Needs: No Transportation Needs   Lack of Transportation (Medical): No   Lack of Transportation (Non-Medical): No  Physical Activity: Inactive   Days of Exercise per Week: 0 days   Minutes of Exercise per Session: 0 min  Stress: No Stress Concern Present   Feeling of Stress : Only a little  Social Connections: Socially Isolated   Frequency of Communication with Friends and Family: Once a week   Frequency of Social Gatherings with Friends and Family: Never   Attends Religious Services: Never   Printmaker: No   Attends Music therapist: Never   Marital Status: Married  Human resources officer Violence: Not At  Risk   Fear of Current or Ex-Partner: No   Emotionally Abused: No   Physically Abused: No   Sexually Abused: No    FAMILY HISTORY:  Family History  Problem Relation Age of Onset   Colon cancer Neg Hx     CURRENT MEDICATIONS:  Current Outpatient Medications  Medication Sig Dispense Refill   acetaminophen (TYLENOL) 500 MG tablet Take 500 mg by mouth every 6 (six) hours as needed for moderate pain or mild pain.     amLODipine (NORVASC) 5 MG tablet Take 1 tablet (5 mg total) by mouth daily. 30 tablet 5   aspirin EC 81 MG tablet Take 1 tablet (81 mg total) by mouth every morning. Swallow whole.     atorvastatin (LIPITOR) 10 MG tablet Take 1 tablet (10 mg total) by mouth daily with supper. 90 tablet 3  carvedilol (COREG) 12.5 MG tablet TAKE 1 TABLET BY MOUTH TWICE DAILY with a meal 30 tablet 3   FEROSUL 325 (65 Fe) MG tablet Take 325 mg by mouth 2 (two) times daily with a meal.     Multiple Vitamins-Minerals (MULTIVITAMIN WITH MINERALS) tablet Take 1 tablet by mouth every morning.     pantoprazole (PROTONIX) 40 MG tablet Take 1 tablet (40 mg total) by mouth 2 (two) times daily. 60 tablet 1   vitamin C (ASCORBIC ACID) 500 MG tablet Take 500 mg by mouth every morning.     vitamin E 100 UNIT capsule Take 100 Units by mouth every morning.     No current facility-administered medications for this visit.    ALLERGIES:  No Known Allergies  PHYSICAL EXAM:  Performance status (ECOG): 1 - Symptomatic but completely ambulatory  There were no vitals filed for this visit. Wt Readings from Last 3 Encounters:  06/05/21 130 lb (59 kg)  05/23/21 133 lb 6.4 oz (60.5 kg)  05/09/21 129 lb 3 oz (58.6 kg)   Physical Exam Vitals reviewed.  Constitutional:      Appearance: Normal appearance.  Cardiovascular:     Rate and Rhythm: Normal rate and regular rhythm.     Pulses: Normal pulses.     Heart sounds: Normal heart sounds.  Pulmonary:     Effort: Pulmonary effort is normal.     Breath  sounds: Normal breath sounds.  Neurological:     General: No focal deficit present.     Mental Status: He is alert and oriented to person, place, and time.  Psychiatric:        Mood and Affect: Mood normal.        Behavior: Behavior normal.     LABORATORY DATA:  I have reviewed the labs as listed.  CBC Latest Ref Rng & Units 06/21/2021 06/05/2021 05/11/2021  WBC 4.0 - 10.5 K/uL 8.4 9.0 7.7  Hemoglobin 13.0 - 17.0 g/dL 9.9(L) 9.8(L) 8.1(L)  Hematocrit 39.0 - 52.0 % 33.9(L) 33.8(L) 28.9(L)  Platelets 150 - 400 K/uL 355 457(H) 265   CMP Latest Ref Rng & Units 06/21/2021 06/05/2021 05/10/2021  Glucose 70 - 99 mg/dL 108(H) 103(H) 94  BUN 8 - 23 mg/dL _0 Creatinine 0.61 - 1.24 mg/dL 1.18 1.17 1.17  Sodium 135 - 145 mmol/L 135 139 138  Potassium 3.5 - 5.1 mmol/L 4.7 5.1 4.1  Chloride 98 - 111 mmol/L 102 101 107  CO2 22 - 32 mmol/L _1 Calcium 8.9 - 10.3 mg/dL 9.5 10.0 9.0  Total Protein 6.5 - 8.1 g/dL 7.1 - 6.1(L)  Total Bilirubin 0.3 - 1.2 mg/dL 0.4 - 1.1  Alkaline Phos 38 - 126 U/L 98 - 77  AST 15 - 41 U/L 24 - 18  ALT 0 - 44 U/L 14 - 11    DIAGNOSTIC IMAGING:  I have independently reviewed the scans and discussed with the patient. No results found.   ASSESSMENT:  1.  Right lung masses concerning for possible malignancy -Presented to emergency department on 01/13/21 with hemoptysis -CT angiography of chest on 01/13/21 showed heterogeneous noncalcified right upper lob lung masses, measuring 2.8 cm x 1.7 cm and 3.1 cm x 2.3 cm, which are concerning for underlying neoplastic process.  (These were not present on CT chest with contrast in September 2019) -PET scan was performed on 01/25/2021 - both right upper lobe lung masses are hypermetabolic and consistent with malignancy, with a mildly hypermetabolic but  small right hilar lymph node, probably involved; faintly accentuated metabolic of right paratracheal lymph node.  No findings of extra thoracic metastatic disease on PET  scan. -Prostatomegaly was noted, with accentuated activity consistent with inflammation, although correlation with a PSA level is suggested. -MRI brain on 01/18/2021 showed a punctate focus of enhancement in the left precentral gyrus, which is favored by radiologist to favor artifact, but follow-up MRI recommended to exclude metastasis -Patient admits to worsening fatigue and intermittent hemoptysis , but denies unexpected weight loss and other B-symptoms -No family history of lung cancer -50-pack year history of tobacco use, quit smoking in 2012  -He underwent bronchoscopy and biopsy by Dr. Roxan Hockey on 03/09/2021. - Reviewed biopsy results which showed squamous cell carcinoma of both the lung masses. - Lymph nodes at stations 7, 4R and 11 R showed no malignant cells identified.  However scant lymphoid tissue present.   2.  Iron deficiency anemia due to chronic blood loss and malabsorption -Likely from combination of chronic GI blood loss and malabsorption, as well as anemia in the setting of possible malignancy (see above) -EGD on 09/17/2018 shows normal esophagus, blood in the entire stomach, 3 actively bleeding AVMs versus Dieulafoy's sealed with a clips.  Normal duodenal bulb and second part of duodenum. -Colonoscopy on 06/24/2018 showed internal hemorrhoids, few medium mouth diverticula in the entire colon.  Subcentimeter polyp in the sigmoid colon. -Bone marrow biopsy on 08/21/2018 showed normocellular marrow with erythroid hyperplasia.  No increase in plasma cells. -Denies any bright red bleeding per rectum or melena. -Has been taking oral iron, but with no improvement in iron saturation, likely poor absorption in the setting of concurrent PPI use -Patient is taking daily aspirin 81 mg, but is uncertain of the reason why.  He denies coronary artery disease, atrial fibrillation, and stroke.  However, review of brain imaging does show signs of old infarcts. -Review of labs from 01/17/2021 show  hemoglobin 9.6, ferritin 37, and serum iron 40, saturation 11%. -Scheduled for IV Feraheme x 2 at last appointment (01/18/2021)   3. Health maintenance  -Incidental findings on PET scan include infrarenal abdominal aortic aneurysm (3.6 cm, recommended for follow-up US ever 2 years; remote left posterior cerebellar infarct; emphysema; coronary atherosclerosis; left adrenal adenoma.   PLAN:  1.  Squamous cell carcinoma the right lung (T3N0): - He started on SBRT for lung lesions.  PD-L1 22 C3-15%. - He reportedly finishes radiation around 06/28/2021. - We will follow-up on NGS and PD-L1 results. - We will likely repeat CT scan of the chest in 3 months.     2.  Iron deficiency anemia due to chronic blood loss and malabsorption: - He received last Feraheme on 05/15/2021 and 05/23/2021. - Denies any bleeding per rectum or melena. - Labs from 06/21/2021 shows ferritin is 145 and percent saturation is 17.  Hemoglobin is 9.9. - We will repeat ferritin, iron panel and CBC in 6 weeks.   Orders placed this encounter:  No orders of the defined types were placed in this encounter.    Derek Jack, MD [place] [phone]   I, Thana Ates, am acting as a scribe for Dr. Derek Jack.  I, Derek Jack MD, have reviewed the above documentation for accuracy and completeness, and I agree with the above.

## 2021-06-25 NOTE — Telephone Encounter (Signed)
I have relayed information (per Dr. Tammi Klippel) to Mr. Casagrande and although sounding unsure to continue treatment, he seemed to entertain the idea of "possibly" continuing the rest of his treatments if he can regain some strength and energy. I attempted to input some positive thinking and tried to get Mr. Spalla to see the brighter side of things. I told him to hang in there, continue his anemia treatments, and that we would follow up with him in a week to see how he's doing, and if he would like to continue his treatments at that time. Mr. Reggio seemed grateful for the information and pleased with the call.

## 2021-06-25 NOTE — Telephone Encounter (Signed)
I spoke w/ Patient Jared Schmidt (MRN: [id number]) and he states that he is just too tired to continue w/ the rest of his treatment. He is having extreme fatigue, chest pain and overall weakness. I notified him that he's had 3/5 SBRT Rt Lung treatments and advised him that it would be in his best interest to finish w/ his treatments, but that he does have the right to refuse further treatment at any time. I reassured him that his current physical/ mental state would be reported to Dr. Tammi Klippel and that we would give him a follow up call after that report was given. At this time Mr. Reyansh Kushnir has decided to decline further treatment.

## 2021-06-26 ENCOUNTER — Inpatient Hospital Stay (HOSPITAL_COMMUNITY): Payer: PPO

## 2021-06-26 ENCOUNTER — Other Ambulatory Visit: Payer: Self-pay

## 2021-06-26 ENCOUNTER — Inpatient Hospital Stay (HOSPITAL_BASED_OUTPATIENT_CLINIC_OR_DEPARTMENT_OTHER): Payer: PPO | Admitting: Hematology

## 2021-06-26 VITALS — BP 132/70 | HR 92 | Temp 96.7°F | Resp 18 | Wt 130.8 lb

## 2021-06-26 DIAGNOSIS — D508 Other iron deficiency anemias: Secondary | ICD-10-CM | POA: Diagnosis not present

## 2021-06-26 DIAGNOSIS — E539 Vitamin B deficiency, unspecified: Secondary | ICD-10-CM | POA: Diagnosis not present

## 2021-06-26 DIAGNOSIS — R918 Other nonspecific abnormal finding of lung field: Secondary | ICD-10-CM

## 2021-06-26 DIAGNOSIS — C3491 Malignant neoplasm of unspecified part of right bronchus or lung: Secondary | ICD-10-CM | POA: Diagnosis not present

## 2021-06-26 NOTE — Progress Notes (Signed)
No feraheme needed today per Dr Raliegh Ip.

## 2021-06-26 NOTE — Patient Instructions (Addendum)
[place] at [facility] Discharge Instructions  You were seen today by Dr. Delton Coombes. He went over your recent results. Dr. Delton Coombes will see you back in 6 weeks for labs and follow up.   Thank you for choosing [place] at [facility] to provide your oncology and hematology care.  To afford each patient quality time with our provider, please arrive at least 15 minutes before your scheduled appointment time.   If you have a lab appointment with the [facility] please come in thru the Main Entrance and check in at the main information desk  You need to re-schedule your appointment should you arrive 10 or more minutes late.  We strive to give you quality time with our providers, and arriving late affects you and other patients whose appointments are after yours.  Also, if you no show three or more times for appointments you may be dismissed from the clinic at the providers discretion.     Again, thank you for choosing [facility].  Our hope is that these requests will decrease the amount of time that you wait before being seen by our physicians.       _____________________________________________________________  Should you have questions after your visit to [facility], please contact our office at [phone] between the hours of 8:00 a.m. and 4:30 p.m.  Voicemails left after 4:00 p.m. will not be returned until the following business day.  For prescription refill requests, have your pharmacy contact our office and allow 72 hours.    Cancer Center Support Programs:   > Cancer Support Group  2nd Tuesday of the month 1pm-2pm, Journey Room

## 2021-06-27 ENCOUNTER — Ambulatory Visit
Admission: RE | Admit: 2021-06-27 | Discharge: 2021-06-27 | Disposition: A | Discharge status: Home / Self Care | Payer: PPO | Source: Ambulatory Visit | Attending: Radiation Oncology | Admitting: Radiation Oncology

## 2021-06-27 DIAGNOSIS — Z510 Encounter for antineoplastic radiation therapy: Secondary | ICD-10-CM | POA: Diagnosis not present

## 2021-06-29 ENCOUNTER — Other Ambulatory Visit: Payer: Self-pay

## 2021-06-29 ENCOUNTER — Ambulatory Visit
Admission: RE | Admit: 2021-06-29 | Discharge: 2021-06-29 | Disposition: A | Discharge status: Home / Self Care | Payer: PPO | Source: Ambulatory Visit | Attending: Radiation Oncology | Admitting: Radiation Oncology

## 2021-06-29 ENCOUNTER — Encounter: Payer: Self-pay | Admitting: Urology

## 2021-06-29 DIAGNOSIS — Z510 Encounter for antineoplastic radiation therapy: Secondary | ICD-10-CM | POA: Diagnosis not present

## 2021-06-29 DIAGNOSIS — C3491 Malignant neoplasm of unspecified part of right bronchus or lung: Secondary | ICD-10-CM

## 2021-06-29 DIAGNOSIS — C3411 Malignant neoplasm of upper lobe, right bronchus or lung: Secondary | ICD-10-CM | POA: Diagnosis not present

## 2021-07-02 ENCOUNTER — Encounter (HOSPITAL_COMMUNITY): Payer: PPO | Admitting: Dietician

## 2021-07-02 ENCOUNTER — Telehealth (HOSPITAL_COMMUNITY): Payer: Self-pay | Admitting: Dietician

## 2021-07-02 NOTE — Telephone Encounter (Signed)
Nutrition Assessment   Reason for Assessment: MST (+weight loss/poor appetite)   ASSESSMENT: 85 year old male with right lung cancer. Patient completed SBRT 8/26.   Past medical history includes anemia, arthritis, GERD, Hypertension, Vit B deficiency, TIA (2020), Emphysema, AAA  Spoke with patient via telephone. Introduced self and services available at [facility]. Patient appreciative of call and and agreeable to telephone visit this morning. Patient reports he is glad to be finished with radiation, says he is sore. Patient denies nausea, vomiting, diarrhea, reports constipation sometimes. Patient reports decreased appetite for "a while" and says food hasn't tasted right since the pandemic. Patient eats 3 meals/day and drinks Boost supplement most day. He has not had breakfast this morning, says his wife has not fixed him anything yet. Patient recalls usually eating bacon, eggs, toast, coffee, water, has a sandwich and soda for lunch, had chicken noodle soup last night for dinner.   Nutrition Focused Physical Exam: unable to complete at this time   Medications: Ferosul, MVI, Vit C, Vit E   Labs: 8/18 - Glucose 108   Anthropometrics:   Height: 5'7" Weight: 59.3 kg (8/23) UBW: 140 lb (per pt) BMI: 20.49   NUTRITION DIAGNOSIS: Unintentional weight loss related to cancer and associated treatments as evidenced by 7% (10 lb) decrease from usual body weight in 4 months. This is insignificant for time frame.    INTERVENTION:  Educated on importance of adequate calorie and protein energy intake to maintain weights, strength, nutrition Discussed strategies for poor appetite - will mail handout Encouraged high calorie, high protein snacks in between meals - will mail handout with ideas Continue drinking Boost supplement daily, recommended Boost Plus for added calories (360 kcal, 14 grams protein) - will mail coupons Contact information provided   MONITORING, EVALUATION, GOAL: Patient will  tolerate increased calories and protein to promote weight stability/gain   Next Visit: f/u in clinic Monday, October 10 after MD visit

## 2021-07-04 ENCOUNTER — Encounter (HOSPITAL_COMMUNITY): Payer: Self-pay | Admitting: Hematology

## 2021-07-10 NOTE — Telephone Encounter (Signed)
Noted  

## 2021-07-17 ENCOUNTER — Ambulatory Visit (HOSPITAL_COMMUNITY)
Admission: RE | Admit: 2021-07-17 | Discharge: 2021-07-17 | Disposition: A | Discharge status: Home / Self Care | Payer: PPO | Source: Ambulatory Visit | Attending: Nurse Practitioner | Admitting: Nurse Practitioner

## 2021-07-17 ENCOUNTER — Other Ambulatory Visit: Payer: Self-pay

## 2021-07-17 ENCOUNTER — Encounter: Payer: Self-pay | Admitting: Nurse Practitioner

## 2021-07-17 ENCOUNTER — Ambulatory Visit (INDEPENDENT_AMBULATORY_CARE_PROVIDER_SITE_OTHER): Payer: PPO | Admitting: Nurse Practitioner

## 2021-07-17 VITALS — BP 127/80 | HR 88 | Temp 97.8°F | Ht 67.0 in | Wt 128.0 lb

## 2021-07-17 DIAGNOSIS — S199XXA Unspecified injury of neck, initial encounter: Secondary | ICD-10-CM | POA: Diagnosis present

## 2021-07-17 DIAGNOSIS — M542 Cervicalgia: Secondary | ICD-10-CM | POA: Diagnosis not present

## 2021-07-17 DIAGNOSIS — W19XXXA Unspecified fall, initial encounter: Secondary | ICD-10-CM | POA: Insufficient documentation

## 2021-07-17 DIAGNOSIS — R937 Abnormal findings on diagnostic imaging of other parts of musculoskeletal system: Secondary | ICD-10-CM | POA: Insufficient documentation

## 2021-07-17 MED ORDER — CERVICAL COLLAR ADJUSTABLE MISC
0 refills | Status: AC
Start: 2021-07-17 — End: ?

## 2021-07-17 MED ORDER — IBUPROFEN 600 MG PO TABS: 600.0000 mg | ORAL_TABLET | Freq: Three times a day (TID) | ORAL | 0 refills | Status: DC | PRN

## 2021-07-17 NOTE — Assessment & Plan Note (Signed)
-  right shoulder pain and neck pain after falling about a month ago -Will get x-rays of neck -no clavicle deformity, so no x-ray there today -Rx. c-collar/neck brace -Rx. ibuprofen

## 2021-07-17 NOTE — Progress Notes (Signed)
Acute Office Visit  Subjective:    Patient ID: Jared Schmidt, male    DOB: October 14, 1936, 85 y.o.   MRN: [id number]  Chief Complaint  Patient presents with   Fall    Had a fall 06/13/21 fell head first into the door, slipped up in the rain. Has been having neck pain since.     Fall  Patient is in today for neck pain after falling on 06/13/21. He rates his pain at 8/10. Has right shoulder/clavicle pain as well. Still has bruising to right shoulder.   Past Medical History:  Diagnosis Date   Anemia    Arthritis    GERD (gastroesophageal reflux disease)    Hypercholesteremia    Hypertension    Iron deficiency anemia due to chronic blood loss 03/17/2018   Iron deficiency anemia due to chronic blood loss 03/17/2018   Stroke ([place])    TIA 2020   Symptomatic anemia 01/20/2018   Vitamin B deficiency    patient denies    Past Surgical History:  Procedure Laterality Date   BIOPSY  06/24/2018   Procedure: BIOPSY;  Surgeon: Daneil Dolin, MD;  [facility];  Service: Endoscopy;;  gastric    COLONOSCOPY N/A 06/24/2018   Dr. Gala Romney: Internal hemorrhoids, diverticulosis, 9 mm polyp removed from the sigmoid colon which was hyperplastic.   ENTEROSCOPY N/A 05/10/2021   Procedure: ENTEROSCOPY;  Surgeon: Daneil Dolin, MD;  [facility];  Service: Endoscopy;  Laterality: N/A;   ESOPHAGOGASTRODUODENOSCOPY N/A 06/24/2018   Dr. Gala Romney: Multiple erosions in the stomach and duodenal bulb, gastric biopsies with chronic gastritis with intestinal metaplasia, no H. pylori.   ESOPHAGOGASTRODUODENOSCOPY (EGD) WITH PROPOFOL N/A 09/17/2018   Procedure: ESOPHAGOGASTRODUODENOSCOPY (EGD) WITH PROPOFOL;  Surgeon: Daneil Dolin, MD;  [facility];  Service: Endoscopy;  Laterality: N/A;  11:00am   ESOPHAGOGASTRODUODENOSCOPY (EGD) WITH PROPOFOL N/A 05/10/2021   Procedure: ESOPHAGOGASTRODUODENOSCOPY (EGD) WITH PROPOFOL;  Surgeon: Daneil Dolin, MD;  [facility];   Service: Endoscopy;  Laterality: N/A;   GIVENS CAPSULE STUDY N/A 09/15/2018   Procedure: GIVENS CAPSULE STUDY;  Surgeon: Daneil Dolin, MD;  [facility];  Service: Endoscopy;  Laterality: N/A;  7:30am   HOT HEMOSTASIS  05/10/2021   Procedure: HOT HEMOSTASIS (ARGON PLASMA COAGULATION/BICAP);  Surgeon: Daneil Dolin, MD;  [facility];  Service: Endoscopy;;   NO PAST SURGERIES     none     POLYPECTOMY  06/24/2018   Procedure: POLYPECTOMY;  Surgeon: Daneil Dolin, MD;  [facility];  Service: Endoscopy;;  sigmoid polyp hs   VIDEO BRONCHOSCOPY WITH ENDOBRONCHIAL NAVIGATION N/A 03/09/2021   Procedure: VIDEO BRONCHOSCOPY WITH ENDOBRONCHIAL NAVIGATION;  Surgeon: Melrose Nakayama, MD;  Location: [facility];  Service: Thoracic;  Laterality: N/A;   VIDEO BRONCHOSCOPY WITH ENDOBRONCHIAL ULTRASOUND N/A 03/09/2021   Procedure: VIDEO BRONCHOSCOPY WITH ENDOBRONCHIAL ULTRASOUND;  Surgeon: Melrose Nakayama, MD;  Location: [facility];  Service: Thoracic;  Laterality: N/A;    Family History  Problem Relation Age of Onset   Colon cancer Neg Hx     Social History   Socioeconomic History   Marital status: Married    Spouse name: Jahmad Petrich   Number of children: Not on file   Years of education: Not on file   Highest education level: 8th grade  Occupational History   Occupation: retired  Tobacco Use   Smoking status: Former    Packs/day: 0.50  Years: 67.00    Pack years: 33.50    Types: Cigarettes    Quit date: 12/11/2017    Years since quitting: 3.6   Smokeless tobacco: Never  Vaping Use   Vaping Use: Never used  Substance and Sexual Activity   Alcohol use: No   Drug use: No   Sexual activity: Not Currently  Other Topics Concern   Not on file  Social History Narrative   Lives with wife [name] in [place]   Dog named poppet      Enjoys hanging around [employer]: eats all food groups   Caffeine: coffee   Water: 4-5 cups daily      Wears  seat belt    Does not use phone while driving    Oceanographer at home    Social Determinants of Health   Financial Resource Strain: Low Risk    Difficulty of Paying Living Expenses: Not hard at all  Food Insecurity: No Food Insecurity   Worried About Charity fundraiser in the Last Year: Never true   Arboriculturist in the Last Year: Never true  Transportation Needs: No Transportation Needs   Lack of Transportation (Medical): No   Lack of Transportation (Non-Medical): No  Physical Activity: Inactive   Days of Exercise per Week: 0 days   Minutes of Exercise per Session: 0 min  Stress: No Stress Concern Present   Feeling of Stress : Only a little  Social Connections: Socially Isolated   Frequency of Communication with Friends and Family: Once a week   Frequency of Social Gatherings with Friends and Family: Never   Attends Religious Services: Never   Printmaker: No   Attends Music therapist: Never   Marital Status: Married  Human resources officer Violence: Not At Risk   Fear of Current or Ex-Partner: No   Emotionally Abused: No   Physically Abused: No   Sexually Abused: No    Outpatient Medications Prior to Visit  Medication Sig Dispense Refill   acetaminophen (TYLENOL) 500 MG tablet Take 500 mg by mouth every 6 (six) hours as needed for moderate pain or mild pain.     amLODipine (NORVASC) 5 MG tablet Take 1 tablet (5 mg total) by mouth daily. 30 tablet 5   aspirin EC 81 MG tablet Take 1 tablet (81 mg total) by mouth every morning. Swallow whole.     atorvastatin (LIPITOR) 10 MG tablet Take 1 tablet (10 mg total) by mouth daily with supper. 90 tablet 3   carvedilol (COREG) 12.5 MG tablet TAKE 1 TABLET BY MOUTH TWICE DAILY with a meal 30 tablet 3   FEROSUL 325 (65 Fe) MG tablet Take 325 mg by mouth 2 (two) times daily with a meal.     Multiple Vitamins-Minerals (MULTIVITAMIN WITH MINERALS) tablet Take 1 tablet by mouth every morning.      pantoprazole (PROTONIX) 40 MG tablet Take 1 tablet (40 mg total) by mouth 2 (two) times daily. 60 tablet 1   vitamin C (ASCORBIC ACID) 500 MG tablet Take 500 mg by mouth every morning.     vitamin E 100 UNIT capsule Take 100 Units by mouth every morning.     No facility-administered medications prior to visit.    No Known Allergies  Review of Systems  Constitutional: Negative.   Musculoskeletal:  Positive for arthralgias.       Neck and right shoulder.clavicle pain since falling  on 8/10      Objective:    Physical Exam Constitutional:      Appearance: Normal appearance.  Musculoskeletal:        General: Tenderness and signs of injury present.  Neurological:     Mental Status: He is alert.    BP 127/80 (BP Location: Left Arm, Patient Position: Sitting, Cuff Size: Normal)   Pulse 88   Temp 97.8 F (36.6 C) (Oral)   Ht _0  (1.702 m)   Wt 128 lb (58.1 kg)   SpO2 92%   BMI 20.05 kg/m  Wt Readings from Last 3 Encounters:  07/17/21 128 lb (58.1 kg)  06/26/21 130 lb 12.8 oz (59.3 kg)  06/05/21 130 lb (59 kg)    There are no preventive care reminders to display for this patient.  There are no preventive care reminders to display for this patient.   No results found for: TSH Lab Results  Component Value Date   WBC 8.4 06/21/2021   HGB 9.9 (L) 06/21/2021   HCT 33.9 (L) 06/21/2021   MCV 85.6 06/21/2021   PLT 355 06/21/2021   Lab Results  Component Value Date   NA 135 06/21/2021   K 4.7 06/21/2021   CO2 26 06/21/2021   GLUCOSE 108 (H) 06/21/2021   BUN 23 06/21/2021   CREATININE 1.18 06/21/2021   BILITOT 0.4 06/21/2021   ALKPHOS 98 06/21/2021   AST 24 06/21/2021   ALT 14 06/21/2021   PROT 7.1 06/21/2021   ALBUMIN 3.5 06/21/2021   CALCIUM 9.5 06/21/2021   ANIONGAP 7 06/21/2021   EGFR 62 06/05/2021   Lab Results  Component Value Date   CHOL 118 02/06/2021   Lab Results  Component Value Date   HDL 38 (L) 02/06/2021   Lab Results  Component Value Date    LDLCALC 58 02/06/2021   Lab Results  Component Value Date   TRIG 122 02/06/2021   Lab Results  Component Value Date   CHOLHDL 3.4 02/16/2020   No results found for: HGBA1C     Assessment & Plan:   Problem List Items Addressed This Visit   None    No orders of the defined types were placed in this encounter.    Noreene Larsson, NP

## 2021-07-18 ENCOUNTER — Other Ambulatory Visit: Payer: Self-pay | Admitting: Internal Medicine

## 2021-07-18 DIAGNOSIS — W19XXXA Unspecified fall, initial encounter: Secondary | ICD-10-CM

## 2021-07-18 DIAGNOSIS — M542 Cervicalgia: Secondary | ICD-10-CM

## 2021-07-19 NOTE — Progress Notes (Signed)
Neck CT was ordered based on radiology recommendation to further evaluate his neck pain. Please make sure that this gets completed. Looks like Dr. Posey Pronto sent in the order.

## 2021-07-20 ENCOUNTER — Other Ambulatory Visit: Payer: Self-pay | Admitting: Nurse Practitioner

## 2021-07-20 ENCOUNTER — Telehealth: Payer: Self-pay | Admitting: Nurse Practitioner

## 2021-07-20 DIAGNOSIS — M542 Cervicalgia: Secondary | ICD-10-CM

## 2021-07-20 MED ORDER — TRAMADOL HCL 50 MG PO TABS: 50.0000 mg | ORAL_TABLET | Freq: Three times a day (TID) | ORAL | 0 refills | Status: AC | PRN

## 2021-07-20 NOTE — Telephone Encounter (Signed)
Sent in tramadol

## 2021-07-20 NOTE — Telephone Encounter (Signed)
PLEASE CALL THE PT REGARDIG xrays THAT WAS COMPLETED

## 2021-07-20 NOTE — Telephone Encounter (Signed)
Spoke with pt wife. She says they have called and scheduled this CT scan for him. She said you told him if he was still having pain to let him know and you would send something different in to [facility]. She said he's still having the pain and the ibu you sent isn't really helping.

## 2021-07-23 NOTE — Telephone Encounter (Signed)
Pt wife informed

## 2021-07-25 DIAGNOSIS — D492 Neoplasm of unspecified behavior of bone, soft tissue, and skin: Secondary | ICD-10-CM | POA: Diagnosis not present

## 2021-07-25 DIAGNOSIS — I700 Atherosclerosis of aorta: Secondary | ICD-10-CM | POA: Diagnosis not present

## 2021-07-25 DIAGNOSIS — M8448XA Pathological fracture, other site, initial encounter for fracture: Secondary | ICD-10-CM | POA: Diagnosis not present

## 2021-07-25 DIAGNOSIS — Z8673 Personal history of transient ischemic attack (TIA), and cerebral infarction without residual deficits: Secondary | ICD-10-CM | POA: Diagnosis not present

## 2021-07-25 DIAGNOSIS — M4313 Spondylolisthesis, cervicothoracic region: Secondary | ICD-10-CM | POA: Diagnosis not present

## 2021-07-25 DIAGNOSIS — M4802 Spinal stenosis, cervical region: Secondary | ICD-10-CM | POA: Diagnosis not present

## 2021-07-25 DIAGNOSIS — C7951 Secondary malignant neoplasm of bone: Secondary | ICD-10-CM | POA: Diagnosis not present

## 2021-07-25 DIAGNOSIS — J439 Emphysema, unspecified: Secondary | ICD-10-CM | POA: Diagnosis not present

## 2021-07-25 DIAGNOSIS — Z5329 Procedure and treatment not carried out because of patient's decision for other reasons: Secondary | ICD-10-CM | POA: Diagnosis not present

## 2021-07-25 DIAGNOSIS — I6789 Other cerebrovascular disease: Secondary | ICD-10-CM | POA: Diagnosis not present

## 2021-07-25 DIAGNOSIS — R519 Headache, unspecified: Secondary | ICD-10-CM | POA: Diagnosis not present

## 2021-07-25 DIAGNOSIS — E041 Nontoxic single thyroid nodule: Secondary | ICD-10-CM | POA: Diagnosis not present

## 2021-07-25 DIAGNOSIS — Z79899 Other long term (current) drug therapy: Secondary | ICD-10-CM | POA: Diagnosis not present

## 2021-07-25 DIAGNOSIS — M47812 Spondylosis without myelopathy or radiculopathy, cervical region: Secondary | ICD-10-CM | POA: Diagnosis not present

## 2021-07-25 DIAGNOSIS — M8088XA Other osteoporosis with current pathological fracture, vertebra(e), initial encounter for fracture: Secondary | ICD-10-CM | POA: Diagnosis not present

## 2021-07-25 DIAGNOSIS — Z85118 Personal history of other malignant neoplasm of bronchus and lung: Secondary | ICD-10-CM | POA: Diagnosis not present

## 2021-07-25 DIAGNOSIS — S12591A Other nondisplaced fracture of sixth cervical vertebra, initial encounter for closed fracture: Secondary | ICD-10-CM | POA: Diagnosis not present

## 2021-07-25 DIAGNOSIS — Z7982 Long term (current) use of aspirin: Secondary | ICD-10-CM | POA: Diagnosis not present

## 2021-07-25 DIAGNOSIS — S12501A Unspecified nondisplaced fracture of sixth cervical vertebra, initial encounter for closed fracture: Secondary | ICD-10-CM | POA: Diagnosis not present

## 2021-07-26 ENCOUNTER — Other Ambulatory Visit: Payer: Self-pay

## 2021-07-26 ENCOUNTER — Ambulatory Visit (INDEPENDENT_AMBULATORY_CARE_PROVIDER_SITE_OTHER): Payer: PPO | Admitting: Nurse Practitioner

## 2021-07-26 ENCOUNTER — Encounter: Payer: Self-pay | Admitting: Nurse Practitioner

## 2021-07-26 VITALS — BP 136/80 | HR 78 | Temp 97.7°F | Ht 67.0 in | Wt 128.0 lb

## 2021-07-26 DIAGNOSIS — C3491 Malignant neoplasm of unspecified part of right bronchus or lung: Secondary | ICD-10-CM

## 2021-07-26 DIAGNOSIS — M542 Cervicalgia: Secondary | ICD-10-CM | POA: Diagnosis not present

## 2021-07-26 NOTE — Progress Notes (Signed)
Acute Office Visit  Subjective:    Patient ID: Jared Schmidt, male    DOB: [birth year], 85 y.o.   MRN: [id number]  Chief Complaint  Patient presents with   Neck Pain    Ongoing for several weeks since recent fall. Went to [facility] yesterday for this and had CT and MRI.     Neck Pain   Patient is in today for ED f/u. He went to ED at [facility] yesterday for neck pain. He had CT there, and he has neck fracture as a result of a fall on 06/13/21. He was recommended to be admitted to the hospital to have urgent neurosurgery referral. He left AMA and came to our office today.  CT of head and C-spine states "1. Pathologic fracture at C2 with destruction of the vertebral body on the left and extension into the superior aspect of C3 on the left. No retropulsed bone is present. Soft tissue may extend into the left C2-3 foramen. Recommend MRI of the cervical spine without and with contrast for further evaluation. 2. Lytic lesions at C6 and C5 are concerning for metastatic disease. 3. Multilevel degenerative changes of the cervical spine as described. 4. 1.8 cm left thyroid nodule. Recommend thyroid US (ref: J Am Coll Radiol. 2015 Feb;12(2): 143-50). 5. Aortic Atherosclerosis (ICD10-I70.0) "  MRI of C-spine states "Lytic destructive metastasis affecting the C2 vertebral body with extension across the disc space and involvement of the superior endplate of C3. Extraosseous tumor bulging towards the foramen on the left. No canal compromise at this moment. No antero or retrolisthesis. Lytic destructive metastasis in the left pedicle and posterior elements at T3 with large extra osseous tumor mass measuring up to 4 cm. Foraminal encroachment on the left at T2-3 and T3-4. There does not appear to be compressive encroachment upon the canal, but this level was not studied in detail on this cervical exam. "  He has f/u with Dr. Delton Coombes on 07/30/21.  He states that he needs help with bathing and ambulating when his  wife is not at home (out shopping and running errands).  She states her insurance can use [place], [facility], and [employer].     Past Medical History:  Diagnosis Date   Anemia    Arthritis    GERD (gastroesophageal reflux disease)    Hypercholesteremia    Hypertension    Iron deficiency anemia due to chronic blood loss 03/17/2018   Iron deficiency anemia due to chronic blood loss 03/17/2018   Stroke ([place])    TIA 2020   Symptomatic anemia 01/20/2018   Vitamin B deficiency    patient denies    Past Surgical History:  Procedure Laterality Date   BIOPSY  06/24/2018   Procedure: BIOPSY;  Surgeon: Daneil Dolin, MD;  [facility];  Service: Endoscopy;;  gastric    COLONOSCOPY N/A 06/24/2018   Dr. Gala Romney: Internal hemorrhoids, diverticulosis, 9 mm polyp removed from the sigmoid colon which was hyperplastic.   ENTEROSCOPY N/A 05/10/2021   Procedure: ENTEROSCOPY;  Surgeon: Daneil Dolin, MD;  [facility];  Service: Endoscopy;  Laterality: N/A;   ESOPHAGOGASTRODUODENOSCOPY N/A 06/24/2018   Dr. Gala Romney: Multiple erosions in the stomach and duodenal bulb, gastric biopsies with chronic gastritis with intestinal metaplasia, no H. pylori.   ESOPHAGOGASTRODUODENOSCOPY (EGD) WITH PROPOFOL N/A 09/17/2018   Procedure: ESOPHAGOGASTRODUODENOSCOPY (EGD) WITH PROPOFOL;  Surgeon: Daneil Dolin, MD;  [facility];  Service: Endoscopy;  Laterality: N/A;  11:00am  ESOPHAGOGASTRODUODENOSCOPY (EGD) WITH PROPOFOL N/A 05/10/2021   Procedure: ESOPHAGOGASTRODUODENOSCOPY (EGD) WITH PROPOFOL;  Surgeon: Daneil Dolin, MD;  [facility];  Service: Endoscopy;  Laterality: N/A;   GIVENS CAPSULE STUDY N/A 09/15/2018   Procedure: GIVENS CAPSULE STUDY;  Surgeon: Daneil Dolin, MD;  [facility];  Service: Endoscopy;  Laterality: N/A;  7:30am   HOT HEMOSTASIS  05/10/2021   Procedure: HOT HEMOSTASIS (ARGON PLASMA COAGULATION/BICAP);  Surgeon: Daneil Dolin,  MD;  [facility];  Service: Endoscopy;;   NO PAST SURGERIES     none     POLYPECTOMY  06/24/2018   Procedure: POLYPECTOMY;  Surgeon: Daneil Dolin, MD;  [facility];  Service: Endoscopy;;  sigmoid polyp hs   VIDEO BRONCHOSCOPY WITH ENDOBRONCHIAL NAVIGATION N/A 03/09/2021   Procedure: VIDEO BRONCHOSCOPY WITH ENDOBRONCHIAL NAVIGATION;  Surgeon: Melrose Nakayama, MD;  Location: [place];  Service: Thoracic;  Laterality: N/A;   VIDEO BRONCHOSCOPY WITH ENDOBRONCHIAL ULTRASOUND N/A 03/09/2021   Procedure: VIDEO BRONCHOSCOPY WITH ENDOBRONCHIAL ULTRASOUND;  Surgeon: Melrose Nakayama, MD;  Location: [place];  Service: Thoracic;  Laterality: N/A;    Family History  Problem Relation Age of Onset   Colon cancer Neg Hx     Social History   Socioeconomic History   Marital status: Married    Spouse name: Aidynn Krenn   Number of children: Not on file   Years of education: Not on file   Highest education level: 8th grade  Occupational History   Occupation: retired  Tobacco Use   Smoking status: Former    Packs/day: 0.50    Years: 67.00    Pack years: 33.50    Types: Cigarettes    Quit date: 12/11/2017    Years since quitting: 3.6   Smokeless tobacco: Never  Vaping Use   Vaping Use: Never used  Substance and Sexual Activity   Alcohol use: No   Drug use: No   Sexual activity: Not Currently  Other Topics Concern   Not on file  Social History Narrative   Lives with wife Av[place] in [place]   Dog named poppet      Enjoys hanging around [employer]: eats all food groups   Caffeine: coffee   Water: 4-5 cups daily      Wears seat belt    Does not use phone while driving    Oceanographer at home    Social Determinants of Health   Financial Resource Strain: Low Risk    Difficulty of Paying Living Expenses: Not hard at all  Food Insecurity: No Food Insecurity   Worried About Charity fundraiser in the Last Year: Never true   Arboriculturist in the  Last Year: Never true  Transportation Needs: No Transportation Needs   Lack of Transportation (Medical): No   Lack of Transportation (Non-Medical): No  Physical Activity: Inactive   Days of Exercise per Week: 0 days   Minutes of Exercise per Session: 0 min  Stress: No Stress Concern Present   Feeling of Stress : Only a little  Social Connections: Socially Isolated   Frequency of Communication with Friends and Family: Once a week   Frequency of Social Gatherings with Friends and Family: Never   Attends Religious Services: Never   Marine scientist or Organizations: No   Attends Archivist Meetings: Never   Marital Status: Married  Human resources officer Violence: Not At  Risk   Fear of Current or Ex-Partner: No   Emotionally Abused: No   Physically Abused: No   Sexually Abused: No    Outpatient Medications Prior to Visit  Medication Sig Dispense Refill   acetaminophen (TYLENOL) 500 MG tablet Take 500 mg by mouth every 6 (six) hours as needed for moderate pain or mild pain.     amLODipine (NORVASC) 5 MG tablet Take 1 tablet (5 mg total) by mouth daily. 30 tablet 5   aspirin EC 81 MG tablet Take 1 tablet (81 mg total) by mouth every morning. Swallow whole.     atorvastatin (LIPITOR) 10 MG tablet Take 1 tablet (10 mg total) by mouth daily with supper. 90 tablet 3   carvedilol (COREG) 12.5 MG tablet TAKE 1 TABLET BY MOUTH TWICE DAILY with a meal 30 tablet 3   Elastic Bandages & Supports (CERVICAL COLLAR ADJUSTABLE) MISC Use neck brace while ambulating to prevent pain. 1 each 0   FEROSUL 325 (65 Fe) MG tablet Take 325 mg by mouth 2 (two) times daily with a meal.     ibuprofen (ADVIL) 600 MG tablet Take 1 tablet (600 mg total) by mouth every 8 (eight) hours as needed for headache, mild pain or moderate pain. 30 tablet 0   Multiple Vitamins-Minerals (MULTIVITAMIN WITH MINERALS) tablet Take 1 tablet by mouth every morning.     pantoprazole (PROTONIX) 40 MG tablet Take 1 tablet (40 mg  total) by mouth 2 (two) times daily. 60 tablet 1   vitamin C (ASCORBIC ACID) 500 MG tablet Take 500 mg by mouth every morning.     vitamin E 100 UNIT capsule Take 100 Units by mouth every morning.     No facility-administered medications prior to visit.    No Known Allergies  Review of Systems  Constitutional: Negative.   Respiratory: Negative.    Cardiovascular: Negative.   Musculoskeletal:  Positive for neck pain.       Walks with walker  Neurological:        Neck pain, left rib pain      Objective:    Physical Exam Constitutional:      General: He is not in acute distress.    Appearance: Normal appearance. He is ill-appearing. He is not toxic-appearing.  Neck:     Comments: Wearing c-collar today Cardiovascular:     Rate and Rhythm: Normal rate and regular rhythm.     Pulses: Normal pulses.     Heart sounds: Normal heart sounds.  Pulmonary:     Effort: Pulmonary effort is normal.     Breath sounds: Normal breath sounds.  Musculoskeletal:     Comments: Walks with cane  Neurological:     General: No focal deficit present.     Mental Status: He is alert and oriented to person, place, and time.    BP 136/80 (BP Location: Right Arm, Patient Position: Sitting, Cuff Size: Large)   Pulse 78   Temp 97.7 F (36.5 C) (Oral)   Ht '5\' 7"'  (1.702 m)   Wt 128 lb (58.1 kg)   SpO2 94%   BMI 20.05 kg/m  Wt Readings from Last 3 Encounters:  07/26/21 128 lb (58.1 kg)  07/17/21 128 lb (58.1 kg)  06/26/21 130 lb 12.8 oz (59.3 kg)    There are no preventive care reminders to display for this patient.  There are no preventive care reminders to display for this patient.   No results found for: TSH Lab Results  Component  Value Date   WBC 8.4 06/21/2021   HGB 9.9 (L) 06/21/2021   HCT 33.9 (L) 06/21/2021   MCV 85.6 06/21/2021   PLT 355 06/21/2021   Lab Results  Component Value Date   NA 135 06/21/2021   K 4.7 06/21/2021   CO2 26 06/21/2021   GLUCOSE 108 (H) 06/21/2021    BUN 23 06/21/2021   CREATININE 1.18 06/21/2021   BILITOT 0.4 06/21/2021   ALKPHOS 98 06/21/2021   AST 24 06/21/2021   ALT 14 06/21/2021   PROT 7.1 06/21/2021   ALBUMIN 3.5 06/21/2021   CALCIUM 9.5 06/21/2021   ANIONGAP 7 06/21/2021   EGFR 62 06/05/2021   Lab Results  Component Value Date   CHOL 118 02/06/2021   Lab Results  Component Value Date   HDL 38 (L) 02/06/2021   Lab Results  Component Value Date   LDLCALC 58 02/06/2021   Lab Results  Component Value Date   TRIG 122 02/06/2021   Lab Results  Component Value Date   CHOLHDL 3.4 02/16/2020   No results found for: HGBA1C     Assessment & Plan:   Problem List Items Addressed This Visit       Respiratory   Squamous cell lung cancer, right ([place]) - Primary    -he has METS to cervical spine and 4 cm mass near T3 -he is having difficulty with mobility without assistance and is having pain -referral to hospice and care coordinator       Relevant Orders   Ambulatory referral to Hospice   AMB Referral to [place]     Other   Neck pain    -wearing C-collar today -he was discharged AMA from [facility] for refusing to be transferred to [facility] to see a neurosurgeon -he does not want surgery for his condition -would consider future neurosurgeon referral if pain persists, or he has issues with c-collar        No orders of the defined types were placed in this encounter.  Time spent: 40 minutes  Noreene Larsson, NP

## 2021-07-26 NOTE — Assessment & Plan Note (Addendum)
-  he has METS to cervical spine and 4 cm mass near T3 -he is having difficulty with mobility without assistance and is having pain -referral to hospice and care coordinator

## 2021-07-26 NOTE — Assessment & Plan Note (Signed)
-  wearing C-collar today -he was discharged AMA from [facility] for refusing to be transferred to [facility] to see a neurosurgeon -he does not want surgery for his condition -would consider future neurosurgeon referral if pain persists, or he has issues with c-collar

## 2021-07-27 ENCOUNTER — Telehealth: Payer: Self-pay

## 2021-07-27 ENCOUNTER — Telehealth: Payer: Self-pay | Admitting: *Deleted

## 2021-07-27 NOTE — Chronic Care Management (AMB) (Signed)
  Chronic Care Management   Note  07/27/2021 Name: Jared Schmidt MRN: [id number] DOB: 1936/04/01  [name] is a 85 y.o. year old male who is a primary care patient of Noreene Larsson, NP. I reached out to [name] by phone today in response to a referral sent by [name] PCP.  [name] was given information about Chronic Care Management services today including:  CCM service includes personalized support from designated clinical staff supervised by his physician, including individualized plan of care and coordination with other care providers 24/7 contact phone numbers for assistance for urgent and routine care needs. Service will only be billed when office clinical staff spend 20 minutes or more in a month to coordinate care. Only one practitioner may furnish and bill the service in a calendar month. The patient may stop CCM services at any time (effective at the end of the month) by phone call to the office staff. The patient is responsible for co-pay (up to 20% after annual deductible is met) if co-pay is required by the individual health plan.   [name] spouse DPR on file  verbally agreed to assistance and services provided by embedded care coordination/care management team today.  Follow up plan: Telephone appointment with care management team member scheduled for:08/07/21  [place] [facility]  Direct Dial: [phone]

## 2021-07-27 NOTE — Telephone Encounter (Signed)
Spoke with wife she had concerns about results for a hospital visit.  She stated they went to ER yesterday and they did a CT/MRI on Jared Schmidt with results saying he had cancer of the neck and spine.  She was concerned about results and wanted to speak with doctor.  Explained that the doctor was out of the office at this time, she would like to call back Monday.  They will visit  another physician on Monday and will have that physician call Dr. Tammi Klippel on Monday.

## 2021-07-27 NOTE — Telephone Encounter (Signed)
Received message 07/26/2021, from Mrs. Langford in reference to Jared Schmidt her husband to return call.  She did not state what call was about, I called back unable to reach at this time.

## 2021-07-28 ENCOUNTER — Other Ambulatory Visit: Payer: Self-pay | Admitting: Nurse Practitioner

## 2021-07-28 DIAGNOSIS — I10 Essential (primary) hypertension: Secondary | ICD-10-CM

## 2021-07-29 NOTE — Progress Notes (Signed)
Jared Schmidt,  [id number]   CLINIC:  Medical Oncology/Hematology  PCP:  Noreene Larsson, NP [address]  [address] / [place] [address] [phone]   REASON FOR VISIT:  Follow-up for right lung cancer  PRIOR THERAPY: none  NGS Results: Caris test with no targetable mutations.  CURRENT THERAPY: under work-up  BRIEF ONCOLOGIC HISTORY:  Oncology History  Iron deficiency anemia due to chronic blood loss    CANCER STAGING: Cancer Staging Squamous cell lung cancer, right (HCC) Staging form: Lung, AJCC 8th Edition - Clinical stage from 03/20/2021: Stage IIB (cT3, cN0, cM0) - Unsigned   INTERVAL HISTORY:  Jared Schmidt, a 85 y.o. male, returns for routine follow-up of his right lung cancer. Jared Schmidt was last seen on 06/26/2021.   Today he reports feeling well. He reports a fall on 08/10. He reports pain in his neck and fatigue. His appetite is good.   REVIEW OF SYSTEMS:  Review of Systems  Constitutional:  Positive for fatigue (60%). Negative for appetite change.  Respiratory:  Positive for shortness of breath.   Musculoskeletal:  Positive for neck pain (6/10).  Neurological:  Positive for headaches.  All other systems reviewed and are negative.  PAST MEDICAL/SURGICAL HISTORY:  Past Medical History:  Diagnosis Date   Anemia    Arthritis    GERD (gastroesophageal reflux disease)    Hypercholesteremia    Hypertension    Iron deficiency anemia due to chronic blood loss 03/17/2018   Iron deficiency anemia due to chronic blood loss 03/17/2018   Stroke ([place])    TIA 2020   Symptomatic anemia 01/20/2018   Vitamin B deficiency    patient denies   Past Surgical History:  Procedure Laterality Date   BIOPSY  06/24/2018   Procedure: BIOPSY;  Surgeon: Daneil Dolin, MD;  [facility];  Service: Endoscopy;;  gastric    COLONOSCOPY N/A 06/24/2018   Dr. Gala Romney: Internal hemorrhoids, diverticulosis, 9 mm polyp  removed from the sigmoid colon which was hyperplastic.   ENTEROSCOPY N/A 05/10/2021   Procedure: ENTEROSCOPY;  Surgeon: Daneil Dolin, MD;  [facility];  Service: Endoscopy;  Laterality: N/A;   ESOPHAGOGASTRODUODENOSCOPY N/A 06/24/2018   Dr. Gala Romney: Multiple erosions in the stomach and duodenal bulb, gastric biopsies with chronic gastritis with intestinal metaplasia, no H. pylori.   ESOPHAGOGASTRODUODENOSCOPY (EGD) WITH PROPOFOL N/A 09/17/2018   Procedure: ESOPHAGOGASTRODUODENOSCOPY (EGD) WITH PROPOFOL;  Surgeon: Daneil Dolin, MD;  [facility];  Service: Endoscopy;  Laterality: N/A;  11:00am   ESOPHAGOGASTRODUODENOSCOPY (EGD) WITH PROPOFOL N/A 05/10/2021   Procedure: ESOPHAGOGASTRODUODENOSCOPY (EGD) WITH PROPOFOL;  Surgeon: Daneil Dolin, MD;  [facility];  Service: Endoscopy;  Laterality: N/A;   GIVENS CAPSULE STUDY N/A 09/15/2018   Procedure: GIVENS CAPSULE STUDY;  Surgeon: Daneil Dolin, MD;  [facility];  Service: Endoscopy;  Laterality: N/A;  7:30am   HOT HEMOSTASIS  05/10/2021   Procedure: HOT HEMOSTASIS (ARGON PLASMA COAGULATION/BICAP);  Surgeon: Daneil Dolin, MD;  [facility];  Service: Endoscopy;;   NO PAST SURGERIES     none     POLYPECTOMY  06/24/2018   Procedure: POLYPECTOMY;  Surgeon: Daneil Dolin, MD;  [facility];  Service: Endoscopy;;  sigmoid polyp hs   VIDEO BRONCHOSCOPY WITH ENDOBRONCHIAL NAVIGATION N/A 03/09/2021   Procedure: VIDEO BRONCHOSCOPY WITH ENDOBRONCHIAL NAVIGATION;  Surgeon: Melrose Nakayama, MD;  Location:  [facility];  Service: Thoracic;  Laterality: N/A;   VIDEO BRONCHOSCOPY WITH ENDOBRONCHIAL ULTRASOUND N/A 03/09/2021   Procedure: VIDEO BRONCHOSCOPY WITH ENDOBRONCHIAL ULTRASOUND;  Surgeon: Melrose Nakayama, MD;  Location: [facility];  Service: Thoracic;  Laterality: N/A;    SOCIAL HISTORY:  Social History   Socioeconomic History   Marital status: Married    Spouse name: Darin Arndt   Number of children: Not on file   Years of education: Not on file   Highest education level: 8th grade  Occupational History   Occupation: retired  Tobacco Use   Smoking status: Former    Packs/day: 0.50    Years: 67.00    Pack years: 33.50    Types: Cigarettes    Quit date: 12/11/2017    Years since quitting: 3.6   Smokeless tobacco: Never  Vaping Use   Vaping Use: Never used  Substance and Sexual Activity   Alcohol use: No   Drug use: No   Sexual activity: Not Currently  Other Topics Concern   Not on file  Social History Narrative   Lives with wife [place] in [place]   Dog named poppet      Enjoys hanging around [employer]: eats all food groups   Caffeine: coffee   Water: 4-5 cups daily      Wears seat belt    Does not use phone while driving    Oceanographer at home    Social Determinants of Health   Financial Resource Strain: Low Risk    Difficulty of Paying Living Expenses: Not hard at all  Food Insecurity: No Food Insecurity   Worried About Charity fundraiser in the Last Year: Never true   Arboriculturist in the Last Year: Never true  Transportation Needs: No Transportation Needs   Lack of Transportation (Medical): No   Lack of Transportation (Non-Medical): No  Physical Activity: Inactive   Days of Exercise per Week: 0 days   Minutes of Exercise per Session: 0 min  Stress: No Stress Concern Present   Feeling of Stress : Only a little  Social Connections: Socially Isolated   Frequency of Communication with Friends and Family: Once a week   Frequency of Social Gatherings with Friends and Family: Never   Attends Religious Services: Never   Printmaker: No   Attends Music therapist: Never   Marital Status: Married  Human resources officer Violence: Not At Risk   Fear of Current or Ex-Partner: No   Emotionally Abused: No   Physically Abused: No   Sexually Abused: No    FAMILY HISTORY:  Family History   Problem Relation Age of Onset   Colon cancer Neg Hx     CURRENT MEDICATIONS:  Current Outpatient Medications  Medication Sig Dispense Refill   acetaminophen (TYLENOL) 500 MG tablet Take 500 mg by mouth every 6 (six) hours as needed for moderate pain or mild pain.     amLODipine (NORVASC) 5 MG tablet Take 1 tablet (5 mg total) by mouth daily. 30 tablet 5   aspirin EC 81 MG tablet Take 1 tablet (81 mg total) by mouth every morning. Swallow whole.     atorvastatin (LIPITOR) 10 MG tablet Take 1 tablet (10 mg total) by mouth daily with supper. 90 tablet 3   carvedilol (COREG) 12.5 MG tablet TAKE 1 TABLET BY MOUTH TWICE DAILY with a meal 30 tablet 3   Elastic  Bandages & Supports (CERVICAL COLLAR ADJUSTABLE) MISC Use neck brace while ambulating to prevent pain. 1 each 0   FEROSUL 325 (65 Fe) MG tablet Take 325 mg by mouth 2 (two) times daily with a meal.     ibuprofen (ADVIL) 600 MG tablet Take 1 tablet (600 mg total) by mouth every 8 (eight) hours as needed for headache, mild pain or moderate pain. 30 tablet 0   Multiple Vitamins-Minerals (MULTIVITAMIN WITH MINERALS) tablet Take 1 tablet by mouth every morning.     pantoprazole (PROTONIX) 40 MG tablet Take 1 tablet (40 mg total) by mouth 2 (two) times daily. 60 tablet 1   vitamin C (ASCORBIC ACID) 500 MG tablet Take 500 mg by mouth every morning.     vitamin E 100 UNIT capsule Take 100 Units by mouth every morning.     No current facility-administered medications for this visit.    ALLERGIES:  No Known Allergies  PHYSICAL EXAM:  Performance status (ECOG): 1 - Symptomatic but completely ambulatory  There were no vitals filed for this visit. Wt Readings from Last 3 Encounters:  07/26/21 128 lb (58.1 kg)  07/17/21 128 lb (58.1 kg)  06/26/21 130 lb 12.8 oz (59.3 kg)   Physical Exam Vitals reviewed.  Constitutional:      Appearance: Normal appearance.     Interventions: Cervical collar in place.  Cardiovascular:     Rate and Rhythm:  Normal rate and regular rhythm.     Pulses: Normal pulses.     Heart sounds: Normal heart sounds.  Pulmonary:     Effort: Pulmonary effort is normal.     Breath sounds: Normal breath sounds.  Neurological:     General: No focal deficit present.     Mental Status: He is alert and oriented to person, place, and time.  Psychiatric:        Mood and Affect: Mood normal.        Behavior: Behavior normal.     LABORATORY DATA:  I have reviewed the labs as listed.  CBC Latest Ref Rng & Units 06/21/2021 06/05/2021 05/11/2021  WBC 4.0 - 10.5 K/uL 8.4 9.0 7.7  Hemoglobin 13.0 - 17.0 g/dL 9.9(L) 9.8(L) 8.1(L)  Hematocrit 39.0 - 52.0 % 33.9(L) 33.8(L) 28.9(L)  Platelets 150 - 400 K/uL 355 457(H) 265   CMP Latest Ref Rng & Units 06/21/2021 06/05/2021 05/10/2021  Glucose 70 - 99 mg/dL 108(H) 103(H) 94  BUN 8 - 23 mg/dL _0 Creatinine 0.61 - 1.24 mg/dL 1.18 1.17 1.17  Sodium 135 - 145 mmol/L 135 139 138  Potassium 3.5 - 5.1 mmol/L 4.7 5.1 4.1  Chloride 98 - 111 mmol/L 102 101 107  CO2 22 - 32 mmol/L _1 Calcium 8.9 - 10.3 mg/dL 9.5 10.0 9.0  Total Protein 6.5 - 8.1 g/dL 7.1 - 6.1(L)  Total Bilirubin 0.3 - 1.2 mg/dL 0.4 - 1.1  Alkaline Phos 38 - 126 U/L 98 - 77  AST 15 - 41 U/L 24 - 18  ALT 0 - 44 U/L 14 - 11    DIAGNOSTIC IMAGING:  I have independently reviewed the scans and discussed with the patient. DG Cervical Spine Complete  Result Date: 07/18/2021 CLINICAL DATA:  Neck pain after fall EXAM: CERVICAL SPINE - COMPLETE 4+ VIEW COMPARISON:  CT neck 08/01/2018 FINDINGS: Straightening of the cervical spine. Vertebral body heights are maintained. Advanced degenerative changes throughout the cervical spine with disc space narrowing and osteophyte C2 through C7. Mild diffuse  bilateral foraminal narrowing. Dens and lateral masses are within normal limits. There is indeterminate lucency at C2 and C3 on the lateral view IMPRESSION: 1. Indeterminate lucencies at C2 and C3 on lateral view only,  given history of neck pain after fall, recommend CT for further evaluation. 2. These results will be called to the ordering clinician or representative by the Radiologist Assistant, and communication documented in the PACS or [employer]. Electronically Signed   By: Donavan Foil M.D.   On: 07/18/2021 17:31     ASSESSMENT:  1.  Right lung masses concerning for possible malignancy -Presented to emergency department on 01/13/21 with hemoptysis -CT angiography of chest on 01/13/21 showed heterogeneous noncalcified right upper lob lung masses, measuring 2.8 cm x 1.7 cm and 3.1 cm x 2.3 cm, which are concerning for underlying neoplastic process.  (These were not present on CT chest with contrast in September 2019) -PET scan was performed on 01/25/2021 - both right upper lobe lung masses are hypermetabolic and consistent with malignancy, with a mildly hypermetabolic but small right hilar lymph node, probably involved; faintly accentuated metabolic of right paratracheal lymph node.  No findings of extra thoracic metastatic disease on PET scan. -Prostatomegaly was noted, with accentuated activity consistent with inflammation, although correlation with a PSA level is suggested. -MRI brain on 01/18/2021 showed a punctate focus of enhancement in the left precentral gyrus, which is favored by radiologist to favor artifact, but follow-up MRI recommended to exclude metastasis -Patient admits to worsening fatigue and intermittent hemoptysis , but denies unexpected weight loss and other B-symptoms -No family history of lung cancer -50-pack year history of tobacco use, quit smoking in 2012  -He underwent bronchoscopy and biopsy by Dr. Roxan Hockey on 03/09/2021. - Reviewed biopsy results which showed squamous cell carcinoma of both the lung masses. - Lymph nodes at stations 7, 4R and 11 R showed no malignant cells identified.  However scant lymphoid tissue present.   2.  Iron deficiency anemia due to chronic blood  loss and malabsorption -Likely from combination of chronic GI blood loss and malabsorption, as well as anemia in the setting of possible malignancy (see above) -EGD on 09/17/2018 shows normal esophagus, blood in the entire stomach, 3 actively bleeding AVMs versus Dieulafoy's sealed with a clips.  Normal duodenal bulb and second part of duodenum. -Colonoscopy on 06/24/2018 showed internal hemorrhoids, few medium mouth diverticula in the entire colon.  Subcentimeter polyp in the sigmoid colon. -Bone marrow biopsy on 08/21/2018 showed normocellular marrow with erythroid hyperplasia.  No increase in plasma cells. -Denies any bright red bleeding per rectum or melena. -Has been taking oral iron, but with no improvement in iron saturation, likely poor absorption in the setting of concurrent PPI use -Patient is taking daily aspirin 81 mg, but is uncertain of the reason why.  He denies coronary artery disease, atrial fibrillation, and stroke.  However, review of brain imaging does show signs of old infarcts. -Review of labs from 01/17/2021 show hemoglobin 9.6, ferritin 37, and serum iron 40, saturation 11%. -Scheduled for IV Feraheme x 2 at last appointment (01/18/2021)   3. Health maintenance  -Incidental findings on PET scan include infrarenal abdominal aortic aneurysm (3.6 cm, recommended for follow-up US ever 2 years; remote left posterior cerebellar infarct; emphysema; coronary atherosclerosis; left adrenal adenoma.   PLAN:  1.  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 - He reportedly fell on June 13, 2021.  He was evaluated by [facility]. - He had MRI of the C-spine as well as CT scan which showed metastatic disease in multiple locations. - I have recommended radiation therapy.  He reports that he is not interested in seeking any active treatment for his cancer. - We talked about best supportive care in the form of hospice.   He is agreeable.  We will make a referral for hospice evaluation and treatment. - I have also told him to discontinue Lipitor, vitamin C, iron tablets.     2.  Iron deficiency anemia due to chronic blood loss and malabsorption: - He reports tiredness. - Reviewed iron panel from 07/30/2021 with ferritin 43, down from 145 less than a month ago.  Hemoglobin however improved 11.1. - We will recommend Feraheme weekly x2.  3.  Neck pain: - He is wearing a c-collar. - He is currently taking tramadol and ibuprofen without much help.  He will discontinue tramadol and ibuprofen. - We will start him on hydrocodone 5/325 every 6 hours as needed.   Orders placed this encounter:  No orders of the defined types were placed in this encounter.    Derek Jack, MD [place] [phone]   I, Thana Ates, am acting as a scribe for Dr. Derek Jack.  I, Derek Jack MD, have reviewed the above documentation for accuracy and completeness, and I agree with the above.

## 2021-07-30 ENCOUNTER — Other Ambulatory Visit (HOSPITAL_COMMUNITY): Payer: Self-pay

## 2021-07-30 ENCOUNTER — Inpatient Hospital Stay (HOSPITAL_COMMUNITY): Payer: PPO | Attending: Hematology | Admitting: Hematology

## 2021-07-30 ENCOUNTER — Other Ambulatory Visit: Payer: Self-pay

## 2021-07-30 ENCOUNTER — Inpatient Hospital Stay (HOSPITAL_COMMUNITY): Payer: PPO

## 2021-07-30 VITALS — BP 132/70 | HR 88 | Temp 96.8°F | Resp 17 | Wt 133.0 lb

## 2021-07-30 DIAGNOSIS — D508 Other iron deficiency anemias: Secondary | ICD-10-CM | POA: Diagnosis not present

## 2021-07-30 DIAGNOSIS — R918 Other nonspecific abnormal finding of lung field: Secondary | ICD-10-CM | POA: Diagnosis not present

## 2021-07-30 DIAGNOSIS — E539 Vitamin B deficiency, unspecified: Secondary | ICD-10-CM | POA: Diagnosis not present

## 2021-07-30 DIAGNOSIS — C3491 Malignant neoplasm of unspecified part of right bronchus or lung: Secondary | ICD-10-CM | POA: Diagnosis not present

## 2021-07-30 DIAGNOSIS — D509 Iron deficiency anemia, unspecified: Secondary | ICD-10-CM | POA: Insufficient documentation

## 2021-07-30 DIAGNOSIS — D500 Iron deficiency anemia secondary to blood loss (chronic): Secondary | ICD-10-CM

## 2021-07-30 LAB — CBC WITH DIFFERENTIAL/PLATELET
Abs Immature Granulocytes: 0.03 10*3/uL (ref 0.00–0.07)
Basophils Absolute: 0 10*3/uL (ref 0.0–0.1)
Basophils Relative: 0 %
Eosinophils Absolute: 0.1 10*3/uL (ref 0.0–0.5)
Eosinophils Relative: 1 %
HCT: 38.2 % — ABNORMAL LOW (ref 39.0–52.0)
Hemoglobin: 11.1 g/dL — ABNORMAL LOW (ref 13.0–17.0)
Immature Granulocytes: 0 %
Lymphocytes Relative: 4 %
Lymphs Abs: 0.3 10*3/uL — ABNORMAL LOW (ref 0.7–4.0)
MCH: 24.8 pg — ABNORMAL LOW (ref 26.0–34.0)
MCHC: 29.1 g/dL — ABNORMAL LOW (ref 30.0–36.0)
MCV: 85.3 fL (ref 80.0–100.0)
Monocytes Absolute: 0.9 10*3/uL (ref 0.1–1.0)
Monocytes Relative: 12 %
Neutro Abs: 6.4 10*3/uL (ref 1.7–7.7)
Neutrophils Relative %: 83 %
Platelets: 331 10*3/uL (ref 150–400)
RBC: 4.48 MIL/uL (ref 4.22–5.81)
RDW: 18.6 % — ABNORMAL HIGH (ref 11.5–15.5)
WBC: 7.8 10*3/uL (ref 4.0–10.5)
nRBC: 0 % (ref 0.0–0.2)

## 2021-07-30 LAB — COMPREHENSIVE METABOLIC PANEL
ALT: 11 U/L (ref 0–44)
AST: 24 U/L (ref 15–41)
Albumin: 3.5 g/dL (ref 3.5–5.0)
Alkaline Phosphatase: 91 U/L (ref 38–126)
Anion gap: 8 (ref 5–15)
BUN: 19 mg/dL (ref 8–23)
CO2: 26 mmol/L (ref 22–32)
Calcium: 10 mg/dL (ref 8.9–10.3)
Chloride: 101 mmol/L (ref 98–111)
Creatinine, Ser: 1.28 mg/dL — ABNORMAL HIGH (ref 0.61–1.24)
GFR, Estimated: 56 mL/min — ABNORMAL LOW (ref >60)
Glucose, Bld: 140 mg/dL — ABNORMAL HIGH (ref 70–99)
Potassium: 4.8 mmol/L (ref 3.5–5.1)
Sodium: 135 mmol/L (ref 135–145)
Total Bilirubin: 0.1 mg/dL — ABNORMAL LOW (ref 0.3–1.2)
Total Protein: 7.2 g/dL (ref 6.5–8.1)

## 2021-07-30 LAB — IRON AND TIBC
Iron: 35 ug/dL — ABNORMAL LOW (ref 45–182)
Saturation Ratios: 10 % — ABNORMAL LOW (ref 17.9–39.5)
TIBC: 334 ug/dL (ref 250–450)
UIBC: 299 ug/dL

## 2021-07-30 LAB — FERRITIN: Ferritin: 43 ng/mL (ref 24–336)

## 2021-07-30 MED ORDER — HYDROCODONE-ACETAMINOPHEN 5-325 MG PO TABS: 1.0000 | ORAL_TABLET | Freq: Four times a day (QID) | ORAL | 0 refills | Status: AC | PRN

## 2021-07-30 MED ORDER — DEXAMETHASONE 2 MG PO TABS: 2.0000 mg | ORAL_TABLET | Freq: Two times a day (BID) | ORAL | 0 refills | Status: DC

## 2021-07-30 NOTE — Patient Instructions (Addendum)
[place] at [facility] Discharge Instructions  You were seen and examined today by Dr. Delton Coombes.  Your recent Emergency Department visit revealed that the cancer has spread to your bones. There is no cure for the cancer as it is now considered Stage IV.   Dr. Delton Coombes discussed treatment options of controlling the cancer versus focusing on comfort.  Dr. Delton Coombes will refer you to [facility] but will arrange for an iron infusion prior to you enrolling in hospice.   Thank you for choosing [place] at [facility] to provide your oncology and hematology care.  To afford each patient quality time with our provider, please arrive at least 15 minutes before your scheduled appointment time.   If you have a lab appointment with the [facility] please come in thru the Main Entrance and check in at the main information desk.  You need to re-schedule your appointment should you arrive 10 or more minutes late.  We strive to give you quality time with our providers, and arriving late affects you and other patients whose appointments are after yours.  Also, if you no show three or more times for appointments you may be dismissed from the clinic at the providers discretion.     Again, thank you for choosing [facility].  Our hope is that these requests will decrease the amount of time that you wait before being seen by our physicians.       _____________________________________________________________  Should you have questions after your visit to [facility], please contact our office at [phone] and follow the prompts.  Our office hours are 8:00 a.m. and 4:30 p.m. Monday - Friday.  Please note that voicemails left after 4:00 p.m. may not be returned until the following business day.  We are closed weekends and major holidays.  You do have access to a nurse 24-7, just call the main number to the clinic  [phone] and do not press any options, hold on the line and a nurse will answer the phone.    For prescription refill requests, have your pharmacy contact our office and allow 72 hours.    Due to Covid, you will need to wear a mask upon entering the hospital. If you do not have a mask, a mask will be given to you at the Main Entrance upon arrival. For doctor visits, patients may have 1 support person age 27 or older with them. For treatment visits, patients can not have anyone with them due to social distancing guidelines and our immunocompromised population.

## 2021-07-31 ENCOUNTER — Ambulatory Visit (HOSPITAL_COMMUNITY): Payer: PPO

## 2021-07-31 ENCOUNTER — Telehealth: Payer: Self-pay

## 2021-07-31 NOTE — Telephone Encounter (Signed)
Meryl Crutch stop by from [facility] to let us know that the patient being admitted to hospice today at 2:00 pm.

## 2021-08-04 ENCOUNTER — Other Ambulatory Visit: Payer: Self-pay | Admitting: Nurse Practitioner

## 2021-08-04 DIAGNOSIS — K219 Gastro-esophageal reflux disease without esophagitis: Secondary | ICD-10-CM

## 2021-08-07 ENCOUNTER — Inpatient Hospital Stay (HOSPITAL_COMMUNITY): Payer: PPO

## 2021-08-07 ENCOUNTER — Telehealth: Payer: PPO

## 2021-08-07 ENCOUNTER — Encounter: Payer: Self-pay | Admitting: Urology

## 2021-08-07 NOTE — Progress Notes (Signed)
Patient's spouse Jared Schmidt reports cancer of spine causing neck pain 8/10, extreme fatigue, loss of mobility in legs, a poor appetite and some shortness of breath. [name] states "patient's condition is declining rapidly."   Meaningful use complete.  [name] notified of patient's 9:00am-08/08/21 telephone appointment and understands.

## 2021-08-08 ENCOUNTER — Other Ambulatory Visit: Payer: Self-pay

## 2021-08-08 ENCOUNTER — Encounter: Payer: Self-pay | Admitting: Nurse Practitioner

## 2021-08-08 ENCOUNTER — Ambulatory Visit (INDEPENDENT_AMBULATORY_CARE_PROVIDER_SITE_OTHER): Payer: PPO | Admitting: Nurse Practitioner

## 2021-08-08 ENCOUNTER — Ambulatory Visit
Admission: RE | Admit: 2021-08-08 | Discharge: 2021-08-08 | Disposition: A | Discharge status: Home / Self Care | Payer: PPO | Source: Ambulatory Visit | Attending: Radiation Oncology | Admitting: Radiation Oncology

## 2021-08-08 ENCOUNTER — Ambulatory Visit: Payer: PPO | Admitting: Family Medicine

## 2021-08-08 ENCOUNTER — Telehealth: Payer: Self-pay | Admitting: Nurse Practitioner

## 2021-08-08 DIAGNOSIS — R531 Weakness: Secondary | ICD-10-CM

## 2021-08-08 DIAGNOSIS — I10 Essential (primary) hypertension: Secondary | ICD-10-CM

## 2021-08-08 DIAGNOSIS — C3491 Malignant neoplasm of unspecified part of right bronchus or lung: Secondary | ICD-10-CM | POA: Insufficient documentation

## 2021-08-08 MED ORDER — CARVEDILOL 12.5 MG PO TABS: 12.5000 mg | ORAL_TABLET | Freq: Two times a day (BID) | ORAL | 3 refills | Status: AC

## 2021-08-08 NOTE — Progress Notes (Signed)
Radiation Oncology         [phone] ________________________________  Name: Jared Schmidt MRN: [id number]  Date: 08/08/2021  DOB: [birth year]  Post Treatment Note  CC: Jared Larsson, NP  Jared Jack, MD  Diagnosis:   85 year old male initially diagnosed with two synchronous, Clinical Stage I, NSCLC, squamous carcinoma involving in the right upper lobe but recent upstaged to Stage IV with metastatic disease to the bone  Interval Since Last Radiation:  5.5 weeks  06/18/21 - 06/29/21:  The targets in the RUL were treated to 54 Gy in 3 fractions of 18 Gy  Narrative:  I spoke with the patient's wife, Jared Schmidt, to conduct his routine scheduled 1 month follow up visit via telephone to spare the patient unnecessary potential exposure in the healthcare setting during the current COVID-19 pandemic.  The patient was notified in advance and gave permission to proceed with this visit format. He tolerated radiation treatment relatively well.   His only complaint throughout treatment was increased fatigue.                              On review of systems, the patient's wife reports that unfortunately Jared Schmidt has had a very rapid decline more recently.  He had a fall at home on 07/17/2021 and was found to have a pathologic fracture associated with bony metastasis to C2-C3 as well as metastatic disease at T2-3 and T3-4.  He was offered consideration for palliative radiotherapy to help manage his pain but the patient declined and has decided to transition to hospice care.  She reports that he has had no ill side effects associated with his recent SBRT and specifically denies chest pain, increased shortness of breath, productive cough, hemoptysis, fever, chills or night sweats.  He is getting progressively weak, to the point where she is unable to care for him herself at home.  He also has significant pain in his neck with any movement or activity.  ALLERGIES:  has No Known  Allergies.  Meds: Current Outpatient Medications  Medication Sig Dispense Refill   acetaminophen (TYLENOL) 500 MG tablet Take 500 mg by mouth every 6 (six) hours as needed for moderate pain or mild pain. (Patient not taking: Reported on 07/30/2021)     amLODipine (NORVASC) 5 MG tablet Take 1 tablet (5 mg total) by mouth daily. 30 tablet 5   aspirin 81 MG EC tablet Take by mouth.     atorvastatin (LIPITOR) 10 MG tablet Take by mouth.     carvedilol (COREG) 12.5 MG tablet TAKE 1 TABLET BY MOUTH TWICE DAILY with a meal 30 tablet 3   dexamethasone (DECADRON) 2 MG tablet Take 1 tablet (2 mg total) by mouth 2 (two) times daily with a meal. 60 tablet 0   Elastic Bandages & Supports (CERVICAL COLLAR ADJUSTABLE) MISC Use neck brace while ambulating to prevent pain. 1 each 0   FEROSUL 325 (65 Fe) MG tablet Take 325 mg by mouth 2 (two) times daily with a meal.     HYDROcodone-acetaminophen (NORCO/VICODIN) 5-325 MG tablet Take 1 tablet by mouth every 6 (six) hours as needed for moderate pain. 120 tablet 0   ibuprofen (ADVIL) 600 MG tablet Take by mouth.     Multiple Vitamins-Minerals (MULTIVITAMIN WITH MINERALS) tablet Take 1 tablet by mouth every morning.     pantoprazole (PROTONIX) 40 MG tablet TAKE 1 TABLET BY MOUTH TWICE DAILY 60 tablet 1  vitamin C (ASCORBIC ACID) 500 MG tablet Take 500 mg by mouth every morning.     vitamin E 100 UNIT capsule Take 100 Units by mouth every morning.     No current facility-administered medications for this encounter.    Physical Findings:  vitals were not taken for this visit.  Pain Assessment Pain Score: 8 /10 Unable to assess due to telephone follow-up visit format.  Lab Findings: Lab Results  Component Value Date   WBC 7.8 07/30/2021   HGB 11.1 (L) 07/30/2021   HCT 38.2 (L) 07/30/2021   MCV 85.3 07/30/2021   PLT 331 07/30/2021     Radiographic Findings: DG Cervical Spine Complete  Result Date: 07/18/2021 CLINICAL DATA:  Neck pain after fall  EXAM: CERVICAL SPINE - COMPLETE 4+ VIEW COMPARISON:  CT neck 08/01/2018 FINDINGS: Straightening of the cervical spine. Vertebral body heights are maintained. Advanced degenerative changes throughout the cervical spine with disc space narrowing and osteophyte C2 through C7. Mild diffuse bilateral foraminal narrowing. Dens and lateral masses are within normal limits. There is indeterminate lucency at C2 and C3 on the lateral view IMPRESSION: 1. Indeterminate lucencies at C2 and C3 on lateral view only, given history of neck pain after fall, recommend CT for further evaluation. 2. These results will be called to the ordering clinician or representative by the Radiologist Assistant, and communication documented in the PACS or [employer]. Electronically Signed   By: Donavan Foil M.D.   On: 07/18/2021 17:31    Impression/Plan: 85. 85 year old male initially diagnosed with two synchronous, Clinical Stage I, NSCLC, squamous carcinoma involving in the right upper lobe but recent upstaged to Stage IV with metastatic disease to the bone. He appears to have recovered well from the effects of his recent SBRT and is currently without complaints in this regard.  However, he has recently been found to have progression of his disease with bony metastasis in the cervical and thoracic spine and has opted to transition his care to hospice.  Therefore, at this point, we will plan to see the patient back on an as-needed basis.  I advised that they are more than welcome to call at anytime with any questions or concerns related to the radiotherapy.  We enjoyed taking care of him and Schmidt forward to continuing to follow his progress via correspondence.    Jared Johns, PA-C

## 2021-08-08 NOTE — Telephone Encounter (Signed)
Patient called in for carvedilol refill

## 2021-08-08 NOTE — Assessment & Plan Note (Addendum)
-  terminally ill -he has neck pain and wears neck brace -has nursing aid that helps with hygiene/bathing -we discussed that if patient is not getting sufficient care at home she can discuss going to hospice house; she will contact RN supervisor with hospice; she would like to get help with mobility/ambulation

## 2021-08-08 NOTE — Progress Notes (Addendum)
  Radiation Oncology         [phone]) [phone] ________________________________  Name: Jared Schmidt MRN: [id number]  Date: 06/29/2021  DOB: 1936/04/13  End of Treatment Note  Diagnosis:   85 year old male with two synchronous, Clinical Stage I, NSCLC, squamous carcinoma involving in the right upper lobe.     Indication for treatment:  Curative, Definitive SBRT       Radiation treatment dates:   06/18/21 - 06/29/21  Site/dose:   The targets in the RUL were treated to 60 Gy in 5 fractions of 12 Gy  Beams/energy:   The patient was treated using stereotactic body radiotherapy according to a 3D conformal radiotherapy plan.  Volumetric arc fields were employed to deliver 6 MV X-rays.  Image guidance was performed with per fraction cone beam CT prior to treatment under personal MD supervision.  Immobilization was achieved using BodyFix Pillow.  Narrative: The patient tolerated radiation treatment relatively well.   His only complaint throughout treatment was increased fatigue.  Plan: The patient has completed radiation treatment. The patient will return to radiation oncology clinic for routine followup in one month. I advised them to call or return sooner if they have any questions or concerns related to their recovery or treatment. ________________________________  Sheral Apley. Tammi Klippel, M.D.

## 2021-08-08 NOTE — Telephone Encounter (Signed)
Refills sent

## 2021-08-08 NOTE — Progress Notes (Signed)
Acute Office Visit  Subjective:    Patient ID: Jared Schmidt, male    DOB: 1936-02-01, 86 y.o.   MRN: [id number]  Chief Complaint  Patient presents with   Follow-up    Follow up     HPI Patient is in today for lab follow-up. He was recently put on hospice for metastatic lung cancer. His wife is speaking to me today, and she is getting respite care 5 days per week. She is concerned that she is not able to help him get out of the hospital bed, and he is having neck pain. She states she tried to get him out of the bed last week, and he fell and she couldn't get him up, so she had to call EMS for lifting assistance.  Past Medical History:  Diagnosis Date   Anemia    Arthritis    GERD (gastroesophageal reflux disease)    Hypercholesteremia    Hypertension    Iron deficiency anemia due to chronic blood loss 03/17/2018   Iron deficiency anemia due to chronic blood loss 03/17/2018   Stroke ([place])    TIA 2020   Symptomatic anemia 01/20/2018   Vitamin B deficiency    patient denies    Past Surgical History:  Procedure Laterality Date   BIOPSY  06/24/2018   Procedure: BIOPSY;  Surgeon: Daneil Dolin, MD;  [facility];  Service: Endoscopy;;  gastric    COLONOSCOPY N/A 06/24/2018   Dr. Gala Romney: Internal hemorrhoids, diverticulosis, 9 mm polyp removed from the sigmoid colon which was hyperplastic.   ENTEROSCOPY N/A 05/10/2021   Procedure: ENTEROSCOPY;  Surgeon: Daneil Dolin, MD;  [facility];  Service: Endoscopy;  Laterality: N/A;   ESOPHAGOGASTRODUODENOSCOPY N/A 06/24/2018   Dr. Gala Romney: Multiple erosions in the stomach and duodenal bulb, gastric biopsies with chronic gastritis with intestinal metaplasia, no H. pylori.   ESOPHAGOGASTRODUODENOSCOPY (EGD) WITH PROPOFOL N/A 09/17/2018   Procedure: ESOPHAGOGASTRODUODENOSCOPY (EGD) WITH PROPOFOL;  Surgeon: Daneil Dolin, MD;  [facility];  Service: Endoscopy;  Laterality: N/A;  11:00am    ESOPHAGOGASTRODUODENOSCOPY (EGD) WITH PROPOFOL N/A 05/10/2021   Procedure: ESOPHAGOGASTRODUODENOSCOPY (EGD) WITH PROPOFOL;  Surgeon: Daneil Dolin, MD;  [facility];  Service: Endoscopy;  Laterality: N/A;   GIVENS CAPSULE STUDY N/A 09/15/2018   Procedure: GIVENS CAPSULE STUDY;  Surgeon: Daneil Dolin, MD;  [facility];  Service: Endoscopy;  Laterality: N/A;  7:30am   HOT HEMOSTASIS  05/10/2021   Procedure: HOT HEMOSTASIS (ARGON PLASMA COAGULATION/BICAP);  Surgeon: Daneil Dolin, MD;  [facility];  Service: Endoscopy;;   NO PAST SURGERIES     none     POLYPECTOMY  06/24/2018   Procedure: POLYPECTOMY;  Surgeon: Daneil Dolin, MD;  [facility];  Service: Endoscopy;;  sigmoid polyp hs   VIDEO BRONCHOSCOPY WITH ENDOBRONCHIAL NAVIGATION N/A 03/09/2021   Procedure: VIDEO BRONCHOSCOPY WITH ENDOBRONCHIAL NAVIGATION;  Surgeon: Melrose Nakayama, MD;  Location: [facility];  Service: Thoracic;  Laterality: N/A;   VIDEO BRONCHOSCOPY WITH ENDOBRONCHIAL ULTRASOUND N/A 03/09/2021   Procedure: VIDEO BRONCHOSCOPY WITH ENDOBRONCHIAL ULTRASOUND;  Surgeon: Melrose Nakayama, MD;  Location: [facility];  Service: Thoracic;  Laterality: N/A;    Family History  Problem Relation Age of Onset   Colon cancer Neg Hx     Social History   Socioeconomic History   Marital status: Married    Spouse name: Zen Cedillos   Number of children: Not  on file   Years of education: Not on file   Highest education level: 8th grade  Occupational History   Occupation: retired  Tobacco Use   Smoking status: Former    Packs/day: 0.50    Years: 67.00    Pack years: 33.50    Types: Cigarettes    Quit date: 12/11/2017    Years since quitting: 3.6   Smokeless tobacco: Never  Vaping Use   Vaping Use: Never used  Substance and Sexual Activity   Alcohol use: No   Drug use: No   Sexual activity: Not Currently  Other Topics Concern   Not on file  Social History Narrative    Lives with wife [name] in [place]   Dog named poppet      Enjoys hanging around [employer]: eats all food groups   Caffeine: coffee   Water: 4-5 cups daily      Wears seat belt    Does not use phone while driving    Oceanographer at home    Social Determinants of Health   Financial Resource Strain: Low Risk    Difficulty of Paying Living Expenses: Not hard at all  Food Insecurity: No Food Insecurity   Worried About Charity fundraiser in the Last Year: Never true   Arboriculturist in the Last Year: Never true  Transportation Needs: No Transportation Needs   Lack of Transportation (Medical): No   Lack of Transportation (Non-Medical): No  Physical Activity: Inactive   Days of Exercise per Week: 0 days   Minutes of Exercise per Session: 0 min  Stress: No Stress Concern Present   Feeling of Stress : Only a little  Social Connections: Socially Isolated   Frequency of Communication with Friends and Family: Once a week   Frequency of Social Gatherings with Friends and Family: Never   Attends Religious Services: Never   Printmaker: No   Attends Music therapist: Never   Marital Status: Married  Human resources officer Violence: Not At Risk   Fear of Current or Ex-Partner: No   Emotionally Abused: No   Physically Abused: No   Sexually Abused: No    Outpatient Medications Prior to Visit  Medication Sig Dispense Refill   acetaminophen (TYLENOL) 500 MG tablet Take 500 mg by mouth every 6 (six) hours as needed for moderate pain or mild pain.     amLODipine (NORVASC) 5 MG tablet Take 1 tablet (5 mg total) by mouth daily. 30 tablet 5   aspirin 81 MG EC tablet Take by mouth.     atorvastatin (LIPITOR) 10 MG tablet Take by mouth.     carvedilol (COREG) 12.5 MG tablet TAKE 1 TABLET BY MOUTH TWICE DAILY with a meal 30 tablet 3   dexamethasone (DECADRON) 2 MG tablet Take 1 tablet (2 mg total) by mouth 2 (two) times daily with a meal. 60 tablet 0    Elastic Bandages & Supports (CERVICAL COLLAR ADJUSTABLE) MISC Use neck brace while ambulating to prevent pain. 1 each 0   FEROSUL 325 (65 Fe) MG tablet Take 325 mg by mouth 2 (two) times daily with a meal.     HYDROcodone-acetaminophen (NORCO/VICODIN) 5-325 MG tablet Take 1 tablet by mouth every 6 (six) hours as needed for moderate pain. 120 tablet 0   ibuprofen (ADVIL) 600 MG tablet Take by mouth.     Multiple Vitamins-Minerals (MULTIVITAMIN WITH MINERALS) tablet Take 1  tablet by mouth every morning.     pantoprazole (PROTONIX) 40 MG tablet TAKE 1 TABLET BY MOUTH TWICE DAILY 60 tablet 1   vitamin C (ASCORBIC ACID) 500 MG tablet Take 500 mg by mouth every morning.     vitamin E 100 UNIT capsule Take 100 Units by mouth every morning.     No facility-administered medications prior to visit.    No Known Allergies  Review of Systems  Constitutional:        Terminally-ill; weakness  Musculoskeletal:        Bedbound  Neurological:  Positive for weakness.      Objective:    Physical Exam  There were no vitals taken for this visit. Wt Readings from Last 3 Encounters:  07/30/21 133 lb (60.3 kg)  07/26/21 128 lb (58.1 kg)  07/17/21 128 lb (58.1 kg)    Health Maintenance Due  Topic Date Due   COVID-19 Vaccine (4 - Booster for Pfizer series) 11/12/2020   INFLUENZA VACCINE  06/04/2021    There are no preventive care reminders to display for this patient.   No results found for: TSH Lab Results  Component Value Date   WBC 7.8 07/30/2021   HGB 11.1 (L) 07/30/2021   HCT 38.2 (L) 07/30/2021   MCV 85.3 07/30/2021   PLT 331 07/30/2021   Lab Results  Component Value Date   NA 135 07/30/2021   K 4.8 07/30/2021   CO2 26 07/30/2021   GLUCOSE 140 (H) 07/30/2021   BUN 19 07/30/2021   CREATININE 1.28 (H) 07/30/2021   BILITOT 0.1 (L) 07/30/2021   ALKPHOS 91 07/30/2021   AST 24 07/30/2021   ALT 11 07/30/2021   PROT 7.2 07/30/2021   ALBUMIN 3.5 07/30/2021   CALCIUM 10.0  07/30/2021   ANIONGAP 8 07/30/2021   EGFR 62 06/05/2021   Lab Results  Component Value Date   CHOL 118 02/06/2021   Lab Results  Component Value Date   HDL 38 (L) 02/06/2021   Lab Results  Component Value Date   LDLCALC 58 02/06/2021   Lab Results  Component Value Date   TRIG 122 02/06/2021   Lab Results  Component Value Date   CHOLHDL 3.4 02/16/2020   No results found for: HGBA1C     Assessment & Plan:   Problem List Items Addressed This Visit       Other   Weakness    -terminally ill -he has neck pain and wears neck brace -has nursing aid that helps with hygiene/bathing -we discussed that if patient is not getting sufficient care at home she can discuss going to hospice house; she will contact RN supervisor with hospice; she would like to get help with mobility/ambulation        No orders of the defined types were placed in this encounter.  Date:  08/08/2021   Location of Patient: Home Location of Provider: Office Consent was obtain for visit to be over via telehealth. I verified that I am speaking with the correct person using two identifiers.  I connected with  [name] on 08/08/21 via telephone and verified that I am speaking with the correct person using two identifiers.   I discussed the limitations of evaluation and management by telemedicine. The patient expressed understanding and agreed to proceed.  Time spent:9 min     Noreene Larsson, NP

## 2021-08-13 ENCOUNTER — Ambulatory Visit (HOSPITAL_COMMUNITY): Payer: PPO | Admitting: Hematology

## 2021-08-13 ENCOUNTER — Ambulatory Visit (HOSPITAL_COMMUNITY): Payer: PPO

## 2021-08-13 ENCOUNTER — Telehealth: Payer: Self-pay | Admitting: Nurse Practitioner

## 2021-08-13 ENCOUNTER — Encounter (HOSPITAL_COMMUNITY): Payer: PPO | Admitting: Dietician

## 2021-08-13 NOTE — Telephone Encounter (Signed)
Pt-SP  is calling to get some help with SP, she is having a hard time and needs some help

## 2021-08-14 NOTE — Telephone Encounter (Signed)
Pt will need an appt with either provider as this will have to have face to face visit since Pearline Cables is out of office

## 2021-08-21 ENCOUNTER — Encounter (HOSPITAL_COMMUNITY): Payer: Self-pay | Admitting: Hematology

## 2021-08-21 NOTE — Telephone Encounter (Signed)
Call No answer

## 2021-09-04 ENCOUNTER — Other Ambulatory Visit (HOSPITAL_COMMUNITY): Payer: Self-pay | Admitting: Hematology

## 2021-10-04 DEATH — deceased

## 2021-11-22 ENCOUNTER — Ambulatory Visit: Payer: PPO | Admitting: Nurse Practitioner

## 2022-04-08 IMAGING — DX DG CHEST 2V
3 series · 3 of 3 positions shown · non-contrast
Comparison: None.
COMPARISON: None.

Addendum:
CLINICAL DATA: Hemoptysis, prior smoker.

EXAM:
CHEST - 2 VIEW

[chest pa]
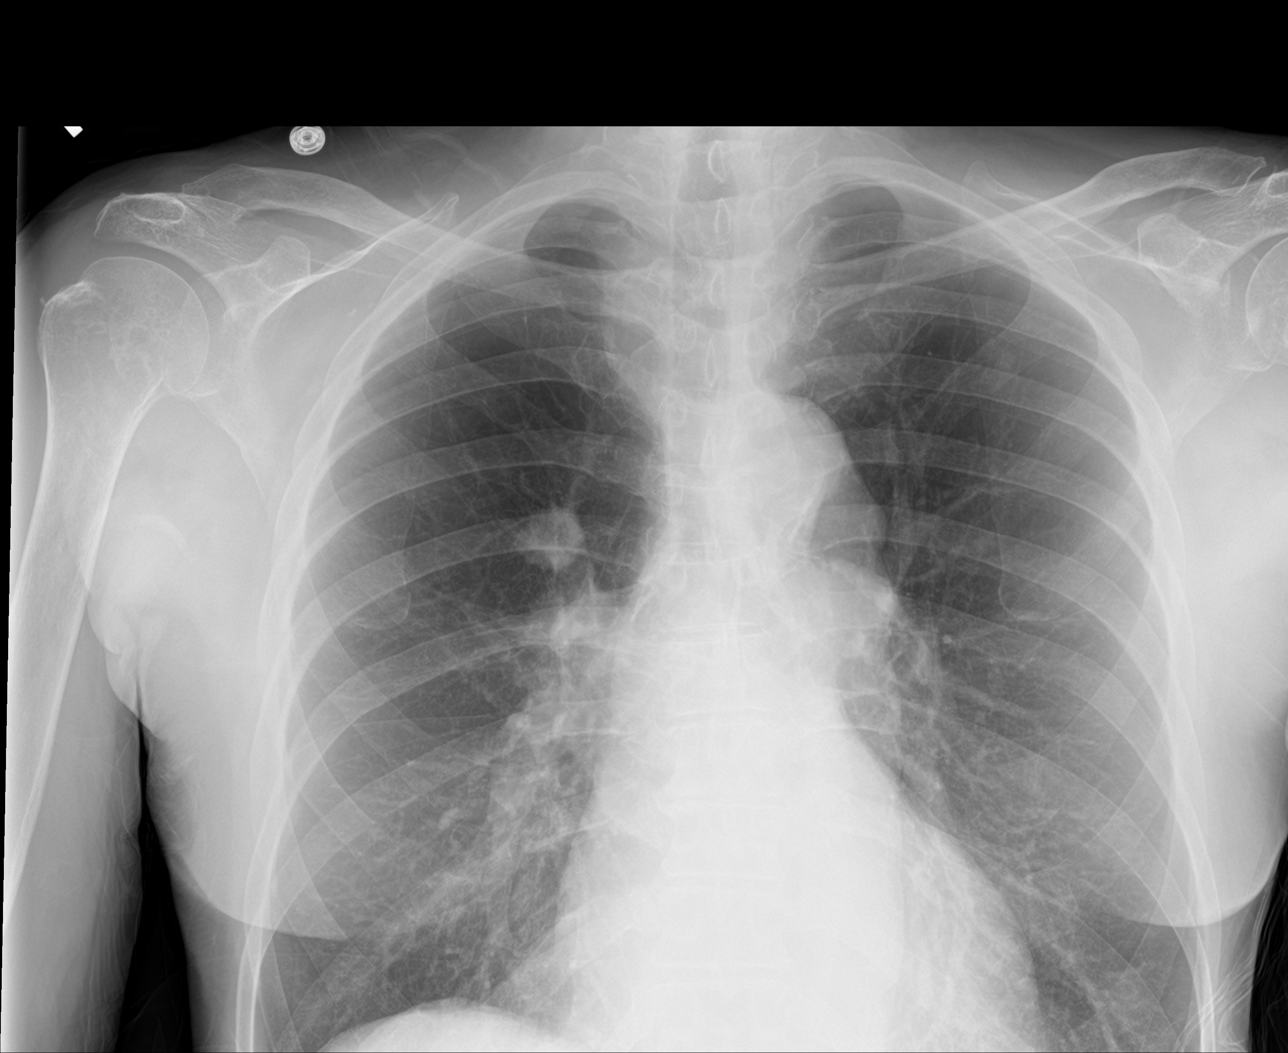

[chest lat]
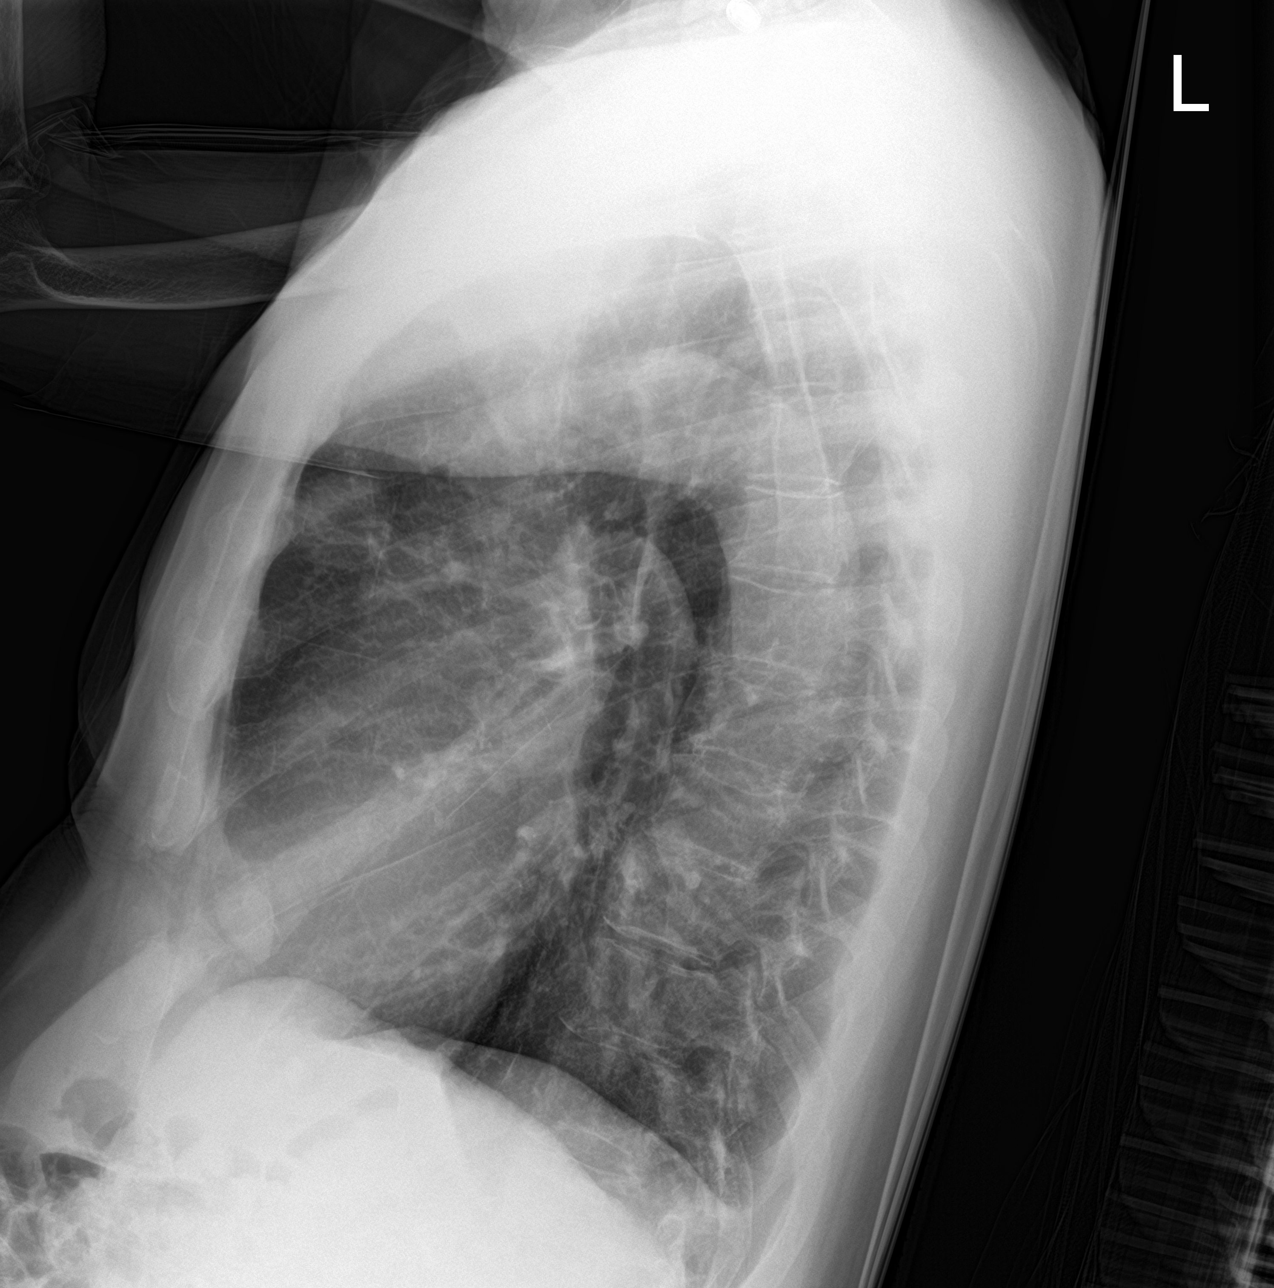

[chest ap]
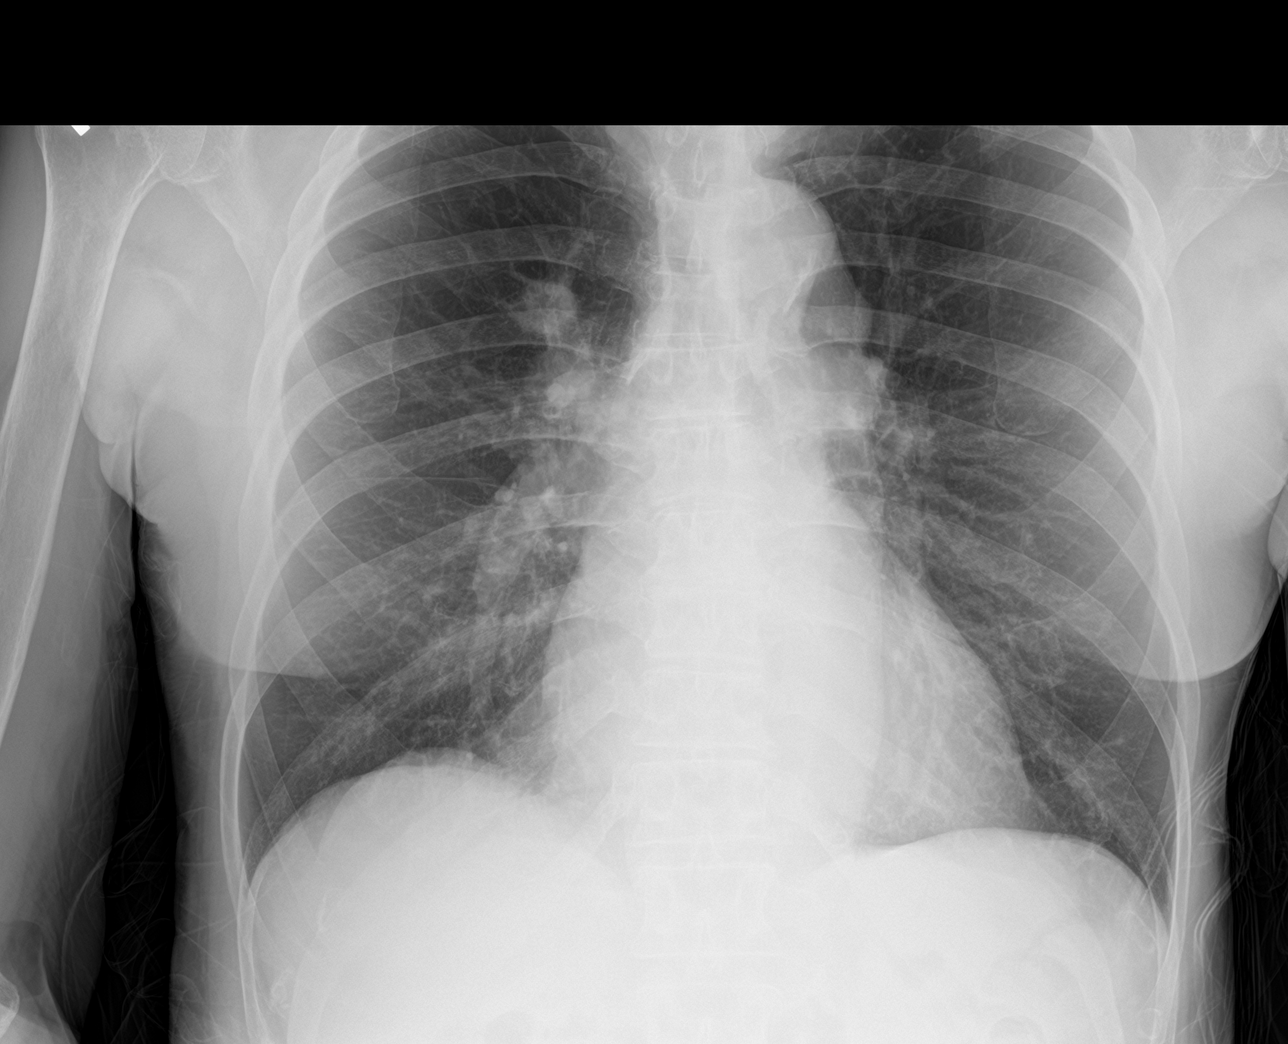

[3 of 3 positions shown; findings below may reference images not displayed]

FINDINGS: Trachea midline.  Cardiomediastinal contours are normal.

Nodule projecting over the RIGHT suprahilar region measuring
approximately 2.1 x 2.1 cm.

No sign of effusion or lobar consolidative changes.

On limited assessment no acute skeletal process.
IMPRESSION: Nodule projecting over the RIGHT suprahilar region measuring
approximately 2.1 x 2.1 cm. This is suspicious for neoplasm. CT of
the chest is suggested for further evaluation.

ADDENDUM:
These results were called by telephone at the time of interpretation
on 01/13/2021 at [DATE] to provider INGELILL OTNEIM , who verbally
acknowledged these results.

*** End of Addendum ***
FINDINGS: Trachea midline.  Cardiomediastinal contours are normal.

Nodule projecting over the RIGHT suprahilar region measuring
approximately 2.1 x 2.1 cm.

No sign of effusion or lobar consolidative changes.

On limited assessment no acute skeletal process.
IMPRESSION: Nodule projecting over the RIGHT suprahilar region measuring
approximately 2.1 x 2.1 cm. This is suspicious for neoplasm. CT of
the chest is suggested for further evaluation.
